# Patient Record
Sex: Female | Born: 1955
Health system: Southern US, Community
[De-identification: ages and names within clinical notes are randomized; demographics above are authoritative.]

## PROBLEM LIST (undated history)

## (undated) DIAGNOSIS — D649 Anemia, unspecified: Secondary | ICD-10-CM

## (undated) DIAGNOSIS — I10 Essential (primary) hypertension: Secondary | ICD-10-CM

## (undated) DIAGNOSIS — Z923 Personal history of irradiation: Secondary | ICD-10-CM

## (undated) DIAGNOSIS — N83209 Unspecified ovarian cyst, unspecified side: Secondary | ICD-10-CM

## (undated) HISTORY — DX: Unspecified ovarian cyst, unspecified side: N83.209

## (undated) HISTORY — DX: Essential (primary) hypertension: I10

## (undated) HISTORY — PX: TOE SURGERY: SHX1073

## (undated) HISTORY — PX: OVARIAN CYST REMOVAL: SHX89

## (undated) HISTORY — PX: COLONOSCOPY: SHX174

## (undated) HISTORY — DX: Anemia, unspecified: D64.9

---

## 1986-08-04 HISTORY — PX: OOPHORECTOMY: SHX86

## 2000-12-17 ENCOUNTER — Encounter: Payer: Self-pay | Admitting: Obstetrics and Gynecology

## 2000-12-17 ENCOUNTER — Encounter: Admission: RE | Admit: 2000-12-17 | Discharge: 2000-12-17 | Payer: Self-pay | Admitting: Obstetrics and Gynecology

## 2001-01-11 ENCOUNTER — Ambulatory Visit (HOSPITAL_COMMUNITY): Admission: RE | Admit: 2001-01-11 | Discharge: 2001-01-11 | Payer: Self-pay | Admitting: Internal Medicine

## 2001-03-10 ENCOUNTER — Encounter: Payer: Self-pay | Admitting: Obstetrics and Gynecology

## 2001-03-11 ENCOUNTER — Ambulatory Visit (HOSPITAL_COMMUNITY): Admission: RE | Admit: 2001-03-11 | Discharge: 2001-03-11 | Payer: Self-pay | Admitting: Obstetrics and Gynecology

## 2001-03-11 ENCOUNTER — Encounter (INDEPENDENT_AMBULATORY_CARE_PROVIDER_SITE_OTHER): Payer: Self-pay | Admitting: Specialist

## 2002-01-21 ENCOUNTER — Encounter: Payer: Self-pay | Admitting: Obstetrics and Gynecology

## 2002-01-21 ENCOUNTER — Ambulatory Visit (HOSPITAL_COMMUNITY): Admission: RE | Admit: 2002-01-21 | Discharge: 2002-01-21 | Payer: Self-pay | Admitting: Obstetrics and Gynecology

## 2003-02-16 ENCOUNTER — Encounter: Payer: Self-pay | Admitting: Obstetrics and Gynecology

## 2003-02-16 ENCOUNTER — Ambulatory Visit (HOSPITAL_COMMUNITY): Admission: RE | Admit: 2003-02-16 | Discharge: 2003-02-16 | Payer: Self-pay | Admitting: Obstetrics and Gynecology

## 2003-10-15 LAB — HM COLONOSCOPY: HM Colonoscopy: NORMAL

## 2004-03-19 ENCOUNTER — Ambulatory Visit (HOSPITAL_COMMUNITY): Admission: RE | Admit: 2004-03-19 | Discharge: 2004-03-19 | Payer: Self-pay | Admitting: Obstetrics and Gynecology

## 2004-12-24 ENCOUNTER — Ambulatory Visit: Payer: Self-pay | Admitting: Internal Medicine

## 2005-03-27 ENCOUNTER — Encounter: Admission: RE | Admit: 2005-03-27 | Discharge: 2005-03-27 | Payer: Self-pay | Admitting: Obstetrics and Gynecology

## 2005-07-04 ENCOUNTER — Ambulatory Visit: Payer: Self-pay | Admitting: Internal Medicine

## 2006-03-19 ENCOUNTER — Ambulatory Visit: Payer: Self-pay | Admitting: Internal Medicine

## 2006-03-31 ENCOUNTER — Encounter: Admission: RE | Admit: 2006-03-31 | Discharge: 2006-03-31 | Payer: Self-pay | Admitting: Obstetrics and Gynecology

## 2006-04-03 ENCOUNTER — Ambulatory Visit: Payer: Self-pay | Admitting: Family Medicine

## 2006-08-28 ENCOUNTER — Ambulatory Visit: Payer: Self-pay | Admitting: Internal Medicine

## 2006-09-11 ENCOUNTER — Ambulatory Visit: Payer: Self-pay | Admitting: Internal Medicine

## 2006-09-11 LAB — HM COLONOSCOPY: HM COLON: NORMAL

## 2007-03-30 ENCOUNTER — Ambulatory Visit: Payer: Self-pay | Admitting: Internal Medicine

## 2007-04-05 LAB — CONVERTED CEMR LAB
BUN: 13 mg/dL (ref 6–23)
Creatinine, Ser: 0.7 mg/dL (ref 0.4–1.2)
Glucose, Bld: 98 mg/dL (ref 70–99)
Potassium: 4.3 meq/L (ref 3.5–5.1)

## 2007-04-06 ENCOUNTER — Encounter (INDEPENDENT_AMBULATORY_CARE_PROVIDER_SITE_OTHER): Payer: Self-pay | Admitting: *Deleted

## 2007-04-12 ENCOUNTER — Ambulatory Visit: Payer: Self-pay | Admitting: Internal Medicine

## 2007-04-12 DIAGNOSIS — E785 Hyperlipidemia, unspecified: Secondary | ICD-10-CM | POA: Insufficient documentation

## 2007-04-12 DIAGNOSIS — I10 Essential (primary) hypertension: Secondary | ICD-10-CM | POA: Insufficient documentation

## 2007-04-12 DIAGNOSIS — G5600 Carpal tunnel syndrome, unspecified upper limb: Secondary | ICD-10-CM | POA: Insufficient documentation

## 2007-04-12 LAB — CONVERTED CEMR LAB
Cholesterol, target level: 200 mg/dL
HDL goal, serum: 40 mg/dL

## 2007-04-28 ENCOUNTER — Encounter: Admission: RE | Admit: 2007-04-28 | Discharge: 2007-04-28 | Payer: Self-pay | Admitting: Obstetrics and Gynecology

## 2007-04-30 ENCOUNTER — Encounter: Payer: Self-pay | Admitting: Internal Medicine

## 2008-05-24 ENCOUNTER — Encounter: Admission: RE | Admit: 2008-05-24 | Discharge: 2008-05-24 | Payer: Self-pay | Admitting: Obstetrics and Gynecology

## 2008-06-12 ENCOUNTER — Telehealth (INDEPENDENT_AMBULATORY_CARE_PROVIDER_SITE_OTHER): Payer: Self-pay | Admitting: *Deleted

## 2008-08-28 ENCOUNTER — Emergency Department (HOSPITAL_COMMUNITY): Admission: EM | Admit: 2008-08-28 | Discharge: 2008-08-29 | Payer: Self-pay | Admitting: Emergency Medicine

## 2008-08-30 ENCOUNTER — Ambulatory Visit: Payer: Self-pay | Admitting: Internal Medicine

## 2008-08-30 DIAGNOSIS — E876 Hypokalemia: Secondary | ICD-10-CM | POA: Insufficient documentation

## 2008-09-04 ENCOUNTER — Telehealth (INDEPENDENT_AMBULATORY_CARE_PROVIDER_SITE_OTHER): Payer: Self-pay | Admitting: *Deleted

## 2008-09-15 ENCOUNTER — Ambulatory Visit: Payer: Self-pay | Admitting: Internal Medicine

## 2009-06-18 ENCOUNTER — Encounter: Admission: RE | Admit: 2009-06-18 | Discharge: 2009-06-18 | Payer: Self-pay | Admitting: Obstetrics and Gynecology

## 2009-06-18 LAB — HM MAMMOGRAPHY: HM Mammogram: NORMAL

## 2009-11-20 ENCOUNTER — Ambulatory Visit: Payer: Self-pay | Admitting: Internal Medicine

## 2010-05-23 ENCOUNTER — Ambulatory Visit: Payer: Self-pay | Admitting: Internal Medicine

## 2010-05-23 DIAGNOSIS — R209 Unspecified disturbances of skin sensation: Secondary | ICD-10-CM | POA: Insufficient documentation

## 2010-05-23 DIAGNOSIS — M2140 Flat foot [pes planus] (acquired), unspecified foot: Secondary | ICD-10-CM | POA: Insufficient documentation

## 2010-05-23 DIAGNOSIS — M79609 Pain in unspecified limb: Secondary | ICD-10-CM | POA: Insufficient documentation

## 2010-06-14 ENCOUNTER — Encounter: Payer: Self-pay | Admitting: Internal Medicine

## 2010-08-25 ENCOUNTER — Encounter: Payer: Self-pay | Admitting: Obstetrics and Gynecology

## 2010-09-01 LAB — CONVERTED CEMR LAB
ALT: 18 units/L (ref 0–35)
AST: 20 units/L (ref 0–37)
Albumin: 4.3 g/dL (ref 3.5–5.2)
Alkaline Phosphatase: 84 units/L (ref 39–117)
Eosinophils Absolute: 0.1 10*3/uL (ref 0.0–0.7)
Hemoglobin: 12.9 g/dL (ref 12.0–15.0)
MCHC: 34.4 g/dL (ref 30.0–36.0)
MCV: 84.3 fL (ref 78.0–100.0)
Monocytes Absolute: 0.4 10*3/uL (ref 0.1–1.0)
Monocytes Relative: 11.2 % (ref 3.0–12.0)
Neutro Abs: 0.5 10*3/uL — ABNORMAL LOW (ref 1.4–7.7)
Neutrophils Relative %: 14 % — ABNORMAL LOW (ref 43.0–77.0)
Potassium: 4 meq/L (ref 3.5–5.1)
RBC: 4.44 M/uL (ref 3.87–5.11)
Total Bilirubin: 0.5 mg/dL (ref 0.3–1.2)
Total Protein: 7.7 g/dL (ref 6.0–8.3)
WBC: 3.5 10*3/uL — ABNORMAL LOW (ref 4.5–10.5)

## 2010-09-05 NOTE — Miscellaneous (Signed)
Summary: Vaccine Record/[facility]  Vaccine Record/[facility]   Imported By: Lanelle Bal 12/05/2009 08:42:55  _____________________________________________________________________  External Attachment:    Type:   Image     Comment:   External Document

## 2010-09-05 NOTE — Assessment & Plan Note (Signed)
Summary: WARM SENSATION AT ANKLE/DENIES PAIN AND SWELLING/KB   Vital Signs:  Patient profile:   55 year old female Weight:      184.2 pounds BMI:     36.10 Temp:     98.3 degrees F oral Pulse rate:   76 / minute Resp:     16 per minute BP sitting:   122 / 80  (left arm) Cuff size:   large  Vitals Entered By: Shonna Chock CMA (May 23, 2010 3:05 PM) CC: 1.) examine right leg and foot (big toe)  2.) Left arm concerns , Lower Extremity Joint pain   CC:  1.) examine right leg and foot (big toe)  2.) Left arm concerns  and Lower Extremity Joint pain.  History of Present Illness:      This is a 55 year old woman who presents with constant  Lower Extremity Joint pain for 3 years.  The patient reports stiffness for >1 hr and decreased ROM, but denies swelling, redness, locking, and popping.  The pain is located in the right great toe joint   The pain is described as sharp, worse with heels.  No evaluation to date . She questions "bunion".It did improve  avoiding extreme heels.  The patient denies the following symptoms: fever, rash, photosensitivity, eye symptoms, diarrhea, and dysuria. Rx: none other than changing foot wear as noted.   She has been having intermittent " warm water" sensation  @ R lateral malleolus X 3 for seconds in past week.   She has pain since 10/16 in L arch w/o injury. PMH of pes planus ; arch supports worn until H.S.   Additionally she has had soreness LUE fro shoulder to elbow; ? related to  caring for her father who has had CVA 4 years ago. This is improving with massage.  He has ? OA; no FH of gout.             Current Medications (verified): 1)  Lisinopril 20 Mg Tabs (Lisinopril) .Marland Kitchen.. 1  Q 12 Hrs 2)  Meclizine Hcl 25 Mg Tabs (Meclizine Hcl) .Marland Kitchen.. 1 By Mouth Every 6 Hours 3)  Verapamil Hcl Cr 120 Mg Cr-Tabs (Verapamil Hcl) .Marland Kitchen.. 1 Two Times A Day 4)  Aspirin 81 Mg Tabs (Aspirin) .Marland Kitchen.. 1 By Mouth Once Daily  Allergies: No Known Drug Allergies  Review of Systems GU:   Denies incontinence. Derm:  Denies lesion(s) and rash. Neuro:  Denies brief paralysis, disturbances in coordination, numbness, poor balance, tingling, and weakness.  Physical Exam  General:  well-nourished,in no acute distress; alert,appropriate and cooperative throughout examination Eyes:  No corneal or conjunctival inflammation noted. Perrla. Mouth:  Oral mucosa and oropharynx without lesions or exudates.  Teeth in good repair. No ulcers Msk:  No deformity or scoliosis noted of thoracic or lumbar spine.   Pulses:  R and L radial,dorsalis pedis and posterior tibial pulses are full and equal bilaterally Extremities:  No clubbing, cyanosis, edema, or deformity noted with normal full range of motion of all joints.   Pes planus. Tender @ base R great toe to palpation. Good nail health Neurologic:  alert & oriented X3, strength normal in all extremities, sensation intact to light touch, and DTRs symmetrical and normal.   Skin:  Intact without suspicious lesions or rashes Cervical Nodes:  No lymphadenopathy noted Axillary Nodes:  No palpable lymphadenopathy Psych:  memory intact for recent and remote, normally interactive, and good eye contact.      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 1  q 12 hrs 2)  Meclizine Hcl 25 Mg Tabs (Meclizine hcl) .Marland Kitchen.. 1 by mouth every 6 hours 3)  Verapamil Hcl Cr 120 Mg Cr-tabs (Verapamil hcl) .Marland Kitchen.. 1 two times a day 4)  Aspirin 81 Mg Tabs (Aspirin) .Marland Kitchen.. 1  by mouth once daily 5)  Tramadol Hcl 50 Mg Tabs (Tramadol hcl) .... 1/2 - 1 every 6 hrs as needed for pain  Patient Instructions: 1)  Arch supports to prevent plantar fasciitis. Podiatry referral if symptoms fail to resolve. Prescriptions: TRAMADOL HCL 50 MG TABS (TRAMADOL HCL) 1/2 - 1 every 6 hrs as needed for pain  #30 x 1   Entered and Authorized by:   Marga Melnick MD   Signed by:   Marga Melnick MD on 05/23/2010   Method used:   Faxed to ...       [facility] (retail)       [address]       [place], [place]  [address]       Ph: [phone]       Fax: [phone]   RxID:   [id number]    Orders Added: 1)  Est. Patient Level IV [54270] 2)  Venipuncture [62376] 3)  TLB-Sedimentation Rate (ESR) [85652-ESR] 4)  TLB-Uric Acid, Blood [84550-URIC] 5)  T-Toe(s) [73660TC]  Appended Document: WARM SENSATION AT ANKLE/DENIES PAIN AND SWELLING/KB

## 2010-09-05 NOTE — Assessment & Plan Note (Signed)
Summary: cpx/ns/kdc   Vital Signs:  Patient profile:   55 year old female Height:      60 inches Weight:      184.0 pounds BMI:     36.06 Temp:     98.7 degrees F oral Pulse rate:   55 / minute Resp:     16 per minute BP sitting:   128 / 86  (left arm) Cuff size:   large  Vitals Entered By: Shonna Chock (November 20, 2009 3:38 PM)  CC: Hypertension Management Comments REVIEWED MED LIST, PATIENT AGREED DOSE AND INSTRUCTION CORRECT    CC:  Hypertension Management.  History of Present Illness: Laura Patrick is here for a physical;she is asymptomatic.   Hypertension History:      She denies headache, chest pain, palpitations, dyspnea with exertion, orthopnea, PND, peripheral edema, visual symptoms, neurologic problems, syncope, and side effects from treatment.  BP 130-147/80-86.        Positive major cardiovascular risk factors include female age 75 years old or older, hyperlipidemia, hypertension, and family history for ischemic heart disease (males less than 45 years old).  Negative major cardiovascular risk factors include no history of diabetes and non-tobacco-user status.        Further assessment for target organ damage reveals no history of ASHD, stroke/TIA, or peripheral vascular disease.     Preventive Screening-Counseling & Management  Alcohol-Tobacco     Smoking Status: never  Caffeine-Diet-Exercise     Does Patient Exercise: no  Allergies (verified): No Known Drug Allergies  Past History:  Past Medical History: Hyperlipidemia: LDL 145 in 1999 Hypertension Shingles 2006  Past Surgical History:  R Oophorectomy 1988 for cyst; G1 P 1 Colonoscopy 2008: negative  Dr Marina Goodell  Family History: Father: HTN, MI @ 64 Mother: DM, CHF Siblings: negative P uncles:DM, CVA; P  aunt DM  Social History: restricts salt Occupation:New Patient  Scheduler  Married Never Smoked Alcohol use-no Regular exercise-no  Review of Systems  The patient denies anorexia, fever,  decreased hearing, hoarseness, syncope, prolonged cough, headaches, hemoptysis, abdominal pain, melena, hematochezia, severe indigestion/heartburn, hematuria, incontinence, suspicious skin lesions, depression, unusual weight change, abnormal bleeding, enlarged lymph nodes, and angioedema.         Weight up 10#.  Eyes:  Denies blurring, double vision, and vision loss-both eyes. CV:  Denies leg cramps with exertion, lightheadness, and near fainting. Neuro:  Denies disturbances in coordination, numbness, poor balance, and tingling. Endo:  Denies cold intolerance, excessive hunger, excessive thirst, excessive urination, and heat intolerance.  Physical Exam  General:  well-nourished; alert,appropriate and cooperative throughout examination Head:  Normocephalic and atraumatic without obvious abnormalities.  Eyes:  No corneal or conjunctival inflammation noted.  Perrla. Funduscopic exam benign, without hemorrhages, exudates or papilledema.  Ears:  External ear exam shows no significant lesions or deformities.  Otoscopic examination reveals clear canals, tympanic membranes are intact bilaterally without bulging, retraction, inflammation or discharge. Hearing is grossly normal bilaterally. Nose:  External nasal examination shows no deformity or inflammation. Nasal mucosa are pink and moist without lesions or exudates. Mouth:  Oral mucosa and oropharynx without lesions or exudates.  Teeth in good repair. Neck:  No deformities, masses, or tenderness noted. Lungs:  Normal respiratory effort, chest expands symmetrically. Lungs are clear to auscultation, no crackles or wheezes. Heart:  normal rate, regular rhythm, no gallop, no rub, no JVD, no HJR, and grade 1 /6 systolic murmur.   Abdomen:  Bowel sounds positive,abdomen soft and non-tender without masses, organomegaly or  hernias noted. Genitalia:  Dr Elana Alm Msk:  No deformity or scoliosis noted of thoracic or lumbar spine.   Pulses:  R and L  carotid,radial,dorsalis pedis and posterior tibial pulses are full and equal bilaterally Extremities:  No clubbing, cyanosis, edema, or deformity noted with normal full range of motion of all joints.   Minor crepitus L knee.Pes planus Neurologic:  alert & oriented X3 and DTRs symmetrical and 1/2+. Negative Tinel's @ wrist Skin:  Intact without suspicious lesions or rashes Cervical Nodes:  No lymphadenopathy noted Axillary Nodes:  No palpable lymphadenopathy Psych:  memory intact for recent and remote, normally interactive, and good eye contact.     Impression & Recommendations:  Problem # 1:  ROUTINE GENERAL MEDICAL EXAM@HEALTH  CARE FACL (ICD-V70.0)  Orders: Venipuncture (16109) TLB-BMP (Basic Metabolic Panel-BMET) (80048-METABOL) TLB-CBC Platelet - w/Differential (85025-CBCD) TLB-Hepatic/Liver Function Pnl (80076-HEPATIC) TLB-TSH (Thyroid Stimulating Hormone) (84443-TSH) T-NMR, Lipoprofile (60454-09811) EKG w/ Interpretation (93000)  Problem # 2:  HYPERTENSION, ESSENTIAL NOS (ICD-401.9)  Her updated medication list for this problem includes:    Lisinopril 20 Mg Tabs (Lisinopril) .Marland Kitchen... 1  q 12 hrs    Verapamil Hcl Cr 120 Mg Cr-tabs (Verapamil hcl) .Marland Kitchen... 1 two times a day  Orders: Venipuncture (91478) EKG w/ Interpretation (93000)  Problem # 3:  HYPERLIPIDEMIA NEC/NOS (ICD-272.4)  Orders: Venipuncture (29562)  Problem # 4:  ISCHEMIC HEART DISEASE, PREMATURE, FAMILY HX (ICD-V17.3) Father MI @ 33 Orders: Venipuncture (13086) T-NMR, Lipoprofile (57846-96295)  Complete Medication List: 1)  Lisinopril 20 Mg Tabs (Lisinopril) .Marland Kitchen.. 1  q 12 hrs 2)  Meclizine Hcl 25 Mg Tabs (Meclizine hcl) .Marland Kitchen.. 1 by mouth every 6 hours 3)  Verapamil Hcl Cr 120 Mg Cr-tabs (Verapamil hcl) .Marland Kitchen.. 1 two times a day  Hypertension Assessment/Plan:      The patient's hypertensive risk group is category B: At least one risk factor (excluding diabetes) with no target organ damage.  Today's blood pressure  is 128/86.    Patient Instructions: 1)  Check your Blood Pressure regularly. If it is above:135/85 ON AVERAGE  you should make an appointment. Prescriptions: VERAPAMIL HCL CR 120 MG CR-TABS (VERAPAMIL HCL) 1 two times a day  #180 Each x 3   Entered and Authorized by:   Marga Melnick MD   Signed by:   Marga Melnick MD on 11/20/2009   Method used:   Faxed to ...       [facility] ([facility])       [address]       [place], [place]  [address]       Ph: [phone]       Fax: [phone]   RxID:   [id number] LISINOPRIL 20 MG TABS (LISINOPRIL) 1  q 12 hrs  #180 Each x 3   Entered and Authorized by:   Marga Melnick MD   Signed by:   Marga Melnick MD on 11/20/2009   Method used:   Faxed to ...       [facility])       [address]       [place], [place]  [address]       Ph: [phone]       Fax: [phone]   RxID:   [id number]

## 2010-10-15 LAB — HM PAP SMEAR: HM PAP: NORMAL

## 2010-11-18 LAB — CBC
Hemoglobin: 13.2 g/dL (ref 12.0–15.0)
MCV: 83.9 fL (ref 78.0–100.0)
Platelets: 237 10*3/uL (ref 150–400)
WBC: 5.3 10*3/uL (ref 4.0–10.5)

## 2010-11-18 LAB — DIFFERENTIAL
Basophils Absolute: 0.1 10*3/uL (ref 0.0–0.1)
Eosinophils Relative: 1 % (ref 0–5)
Lymphocytes Relative: 26 % (ref 12–46)
Monocytes Relative: 7 % (ref 3–12)
Neutrophils Relative %: 64 % (ref 43–77)

## 2010-11-18 LAB — BASIC METABOLIC PANEL
BUN: 9 mg/dL (ref 6–23)
CO2: 27 mEq/L (ref 19–32)
Chloride: 102 mEq/L (ref 96–112)
GFR calc non Af Amer: >60 mL/min (ref >60)
Glucose, Bld: 121 mg/dL — ABNORMAL HIGH (ref 70–99)

## 2010-11-18 LAB — POCT CARDIAC MARKERS: Myoglobin, poc: 66.3 ng/mL (ref 12–200)

## 2010-11-18 LAB — BRAIN NATRIURETIC PEPTIDE: Pro B Natriuretic peptide (BNP): 73 pg/mL (ref 0.0–100.0)

## 2010-12-20 NOTE — Op Note (Signed)
[facility]  Patient:    Laura Patrick, Laura Patrick                    MRN: [id number] Proc. Date: 03/11/01 Adm. Date:  [id number] Attending:  Lendon Colonel                           Operative Report  PREOPERATIVE DIAGNOSIS:  Fibroids with anemia.  POSTOPERATIVE DIAGNOSES:  Fibroids with anemia.  OPERATION:  Hysteroscopy with resection of endometrial fibroid.  DESCRIPTION OF PROCEDURE:  The patient was placed in lithotomy position and prepped and draped in the usual fashion.  Cervix dilated, and large fibroids were noted anteriorly and posteriorly.  Using the resectoscope, I was able to resect all of these fibroids.  No unusual blood loss occurred during the procedure.  All of the resecting material was sent to the lab for study. [name] tolerated this procedure well. DD:  03/11/01 TD:  03/12/01 Job: [id number]

## 2011-01-17 ENCOUNTER — Other Ambulatory Visit: Payer: Self-pay | Admitting: Internal Medicine

## 2011-11-26 ENCOUNTER — Other Ambulatory Visit: Payer: Self-pay | Admitting: Internal Medicine

## 2011-11-26 DIAGNOSIS — Z1231 Encounter for screening mammogram for malignant neoplasm of breast: Secondary | ICD-10-CM

## 2011-12-01 ENCOUNTER — Ambulatory Visit: Payer: Self-pay

## 2012-04-23 ENCOUNTER — Ambulatory Visit
Admission: RE | Admit: 2012-04-23 | Discharge: 2012-04-23 | Disposition: A | Discharge status: Home / Self Care | Payer: Commercial Managed Care - PPO | Source: Ambulatory Visit | Attending: Internal Medicine | Admitting: Internal Medicine

## 2012-04-23 DIAGNOSIS — Z1231 Encounter for screening mammogram for malignant neoplasm of breast: Secondary | ICD-10-CM

## 2013-08-09 ENCOUNTER — Other Ambulatory Visit: Payer: Self-pay

## 2013-08-09 DIAGNOSIS — Z1231 Encounter for screening mammogram for malignant neoplasm of breast: Secondary | ICD-10-CM

## 2013-08-29 ENCOUNTER — Ambulatory Visit: Payer: Commercial Managed Care - PPO

## 2013-09-05 ENCOUNTER — Ambulatory Visit: Payer: Commercial Managed Care - PPO

## 2013-09-09 ENCOUNTER — Ambulatory Visit: Payer: Commercial Managed Care - PPO

## 2013-10-13 ENCOUNTER — Telehealth: Payer: Self-pay

## 2013-10-13 NOTE — Telephone Encounter (Signed)
Left message for call back Non-identifiable   Pap [facility]- 04/23/12-negative Flu Td

## 2013-10-13 NOTE — Telephone Encounter (Signed)
Medication and allergies:  Reviewed and updated  90 day supply/mail order: n/a Local pharmacy:  [facility] [place], [place] - [address]   Immunizations due:     A/P: No changes to personal, family history or past surgical hx PAP- "more than three years ago" per patient [facility]- 08/2004- negative per patient [facility]- 04/23/12-negative Flu Tdap   To Discuss with Provider:

## 2013-10-14 ENCOUNTER — Encounter: Payer: Self-pay | Admitting: Family Medicine

## 2013-10-14 ENCOUNTER — Ambulatory Visit (INDEPENDENT_AMBULATORY_CARE_PROVIDER_SITE_OTHER): Payer: Commercial Managed Care - PPO | Admitting: Family Medicine

## 2013-10-14 VITALS — BP 130/78 | HR 84 | Temp 98.5°F | Resp 16 | Ht 61.0 in | Wt 188.5 lb

## 2013-10-14 DIAGNOSIS — E785 Hyperlipidemia, unspecified: Secondary | ICD-10-CM

## 2013-10-14 DIAGNOSIS — E669 Obesity, unspecified: Secondary | ICD-10-CM

## 2013-10-14 DIAGNOSIS — I10 Essential (primary) hypertension: Secondary | ICD-10-CM

## 2013-10-14 LAB — CBC WITH DIFFERENTIAL/PLATELET
Basophils Absolute: 0 10*3/uL (ref 0.0–0.1)
Basophils Relative: 1 % (ref 0–1)
EOS ABS: 0 10*3/uL (ref 0.0–0.7)
EOS PCT: 1 % (ref 0–5)
HCT: 37.1 % (ref 36.0–46.0)
Hemoglobin: 12.4 g/dL (ref 12.0–15.0)
LYMPHS ABS: 2.2 10*3/uL (ref 0.7–4.0)
Lymphocytes Relative: 51 % — ABNORMAL HIGH (ref 12–46)
MCH: 27.2 pg (ref 26.0–34.0)
MCHC: 33.4 g/dL (ref 30.0–36.0)
MCV: 81.4 fL (ref 78.0–100.0)
Monocytes Absolute: 0.3 10*3/uL (ref 0.1–1.0)
Monocytes Relative: 7 % (ref 3–12)
Neutro Abs: 1.7 10*3/uL (ref 1.7–7.7)
Neutrophils Relative %: 40 % — ABNORMAL LOW (ref 43–77)
PLATELETS: 313 10*3/uL (ref 150–400)
RBC: 4.56 MIL/uL (ref 3.87–5.11)
RDW: 14.5 % (ref 11.5–15.5)
WBC: 4.3 10*3/uL (ref 4.0–10.5)

## 2013-10-14 NOTE — Progress Notes (Signed)
   Subjective:    Patient ID: Laura Patrick, female    DOB: 11-21-1955, 58 y.o.   MRN: [id number]  HPI New to establish.  Previous MD- Linna Darner but has not seen in >3 yrs.  HTN- was previously on Verapamil but after listening to advice has started to eat better and get regular activity, BP normalized.  Has not been on meds x2 yrs.  BP adequately controlled today.  No CP, SOB, HAs, visual changes, edema.  Hyperlipidemia- noted on problem list.  Has never been on meds.  Has not had labs checked recently.  Eating healthy diet regularly and attempting regular exercise.  Obesity- chronic problem, pt is attempting to control w/ healthy diet and regular exercise.  Has a FitBit to track steps.  Reports she has gained 10+ since last check.   Review of Systems For ROS see HPI     Objective:   Physical Exam  Vitals reviewed. Constitutional: She is oriented to person, place, and time. She appears well-developed and well-nourished. No distress.  HENT:  Head: Normocephalic and atraumatic.  Eyes: Conjunctivae and EOM are normal. Pupils are equal, round, and reactive to light.  Neck: Normal range of motion. Neck supple. No thyromegaly present.  Cardiovascular: Normal rate, regular rhythm, normal heart sounds and intact distal pulses.   No murmur heard. Pulmonary/Chest: Effort normal and breath sounds normal. No respiratory distress.  Abdominal: Soft. She exhibits no distension. There is no tenderness.  Musculoskeletal: She exhibits no edema.  Lymphadenopathy:    She has no cervical adenopathy.  Neurological: She is alert and oriented to person, place, and time.  Skin: Skin is warm and dry.  Psychiatric: She has a normal mood and affect. Her behavior is normal.          Assessment & Plan:

## 2013-10-14 NOTE — Patient Instructions (Signed)
Schedule your complete physical in 6 months We'll notify you of your lab results and make any changes if needed Keep up the good work on healthy diet and regular exercise!! Call with any questions or concerns Welcome Back!!!

## 2013-10-14 NOTE — Progress Notes (Signed)
Pre visit review using our clinic review tool, if applicable. No additional management support is needed unless otherwise documented below in the visit note. 

## 2013-10-15 LAB — BASIC METABOLIC PANEL
BUN: 10 mg/dL (ref 6–23)
CALCIUM: 9.4 mg/dL (ref 8.4–10.5)
CHLORIDE: 107 meq/L (ref 96–112)
CO2: 25 mEq/L (ref 19–32)
CREATININE: 0.82 mg/dL (ref 0.50–1.10)
GLUCOSE: 81 mg/dL (ref 70–99)
POTASSIUM: 3.7 meq/L (ref 3.5–5.3)
Sodium: 141 mEq/L (ref 135–145)

## 2013-10-15 LAB — LIPID PANEL
Cholesterol: 172 mg/dL (ref 0–200)
HDL: 57 mg/dL (ref >39)
LDL Cholesterol: 98 mg/dL (ref 0–99)
TRIGLYCERIDES: 83 mg/dL (ref <150)
Total CHOL/HDL Ratio: 3 Ratio
VLDL: 17 mg/dL (ref 0–40)

## 2013-10-15 LAB — HEPATIC FUNCTION PANEL
ALBUMIN: 4.5 g/dL (ref 3.5–5.2)
ALT: 15 U/L (ref 0–35)
AST: 19 U/L (ref 0–37)
Alkaline Phosphatase: 78 U/L (ref 39–117)
BILIRUBIN DIRECT: 0.1 mg/dL (ref 0.0–0.3)
BILIRUBIN TOTAL: 0.4 mg/dL (ref 0.2–1.2)
Indirect Bilirubin: 0.3 mg/dL (ref 0.2–1.2)
TOTAL PROTEIN: 7.9 g/dL (ref 6.0–8.3)

## 2013-10-15 LAB — TSH: TSH: 1.332 u[IU]/mL (ref 0.350–4.500)

## 2013-10-15 NOTE — Assessment & Plan Note (Signed)
Noted on problem list from previous provider.  Has never been on meds.  Check labs.  Start meds prn.

## 2013-10-15 NOTE — Assessment & Plan Note (Signed)
New.  Check labs to risk stratify.  Pt is working on regular exercise and healthy food choices.  Will continue to follow closely.

## 2013-10-15 NOTE — Assessment & Plan Note (Signed)
Noted previously by pt's former physician but has made lifestyle changes and has not required meds recently.  Asymptomatic.  No need for meds at this time.  Will follow.

## 2013-10-17 ENCOUNTER — Encounter: Payer: Self-pay | Admitting: General Practice

## 2013-10-18 ENCOUNTER — Encounter: Payer: Self-pay | Admitting: General Practice

## 2014-04-18 ENCOUNTER — Encounter: Payer: Commercial Managed Care - PPO | Admitting: Family Medicine

## 2014-04-21 ENCOUNTER — Encounter: Payer: Commercial Managed Care - PPO | Admitting: Family Medicine

## 2014-08-08 ENCOUNTER — Ambulatory Visit
Admission: RE | Admit: 2014-08-08 | Discharge: 2014-08-08 | Disposition: A | Discharge status: Home / Self Care | Payer: Commercial Managed Care - PPO | Source: Ambulatory Visit

## 2014-08-08 DIAGNOSIS — Z1231 Encounter for screening mammogram for malignant neoplasm of breast: Secondary | ICD-10-CM

## 2014-08-10 ENCOUNTER — Ambulatory Visit (INDEPENDENT_AMBULATORY_CARE_PROVIDER_SITE_OTHER): Payer: Commercial Managed Care - PPO | Admitting: Family Medicine

## 2014-08-10 ENCOUNTER — Other Ambulatory Visit (HOSPITAL_COMMUNITY)
Admission: RE | Admit: 2014-08-10 | Discharge: 2014-08-10 | Disposition: A | Discharge status: Home / Self Care | Payer: Commercial Managed Care - PPO | Source: Ambulatory Visit | Attending: Family Medicine | Admitting: Family Medicine

## 2014-08-10 ENCOUNTER — Encounter: Payer: Self-pay | Admitting: Family Medicine

## 2014-08-10 VITALS — BP 162/94 | HR 85 | Temp 98.1°F | Resp 16 | Ht 61.0 in | Wt 192.0 lb

## 2014-08-10 DIAGNOSIS — Z1151 Encounter for screening for human papillomavirus (HPV): Secondary | ICD-10-CM | POA: Insufficient documentation

## 2014-08-10 DIAGNOSIS — I10 Essential (primary) hypertension: Secondary | ICD-10-CM

## 2014-08-10 DIAGNOSIS — Z124 Encounter for screening for malignant neoplasm of cervix: Secondary | ICD-10-CM

## 2014-08-10 DIAGNOSIS — Z01419 Encounter for gynecological examination (general) (routine) without abnormal findings: Secondary | ICD-10-CM

## 2014-08-10 DIAGNOSIS — Z0000 Encounter for general adult medical examination without abnormal findings: Secondary | ICD-10-CM | POA: Insufficient documentation

## 2014-08-10 MED ORDER — LOSARTAN POTASSIUM 50 MG PO TABS: 50.0000 mg | ORAL_TABLET | Freq: Every day | ORAL | Status: DC

## 2014-08-10 NOTE — Progress Notes (Signed)
Pre visit review using our clinic review tool, if applicable. No additional management support is needed unless otherwise documented below in the visit note. 

## 2014-08-10 NOTE — Patient Instructions (Signed)
Follow up in 1 month to recheck BP We'll notify you of your lab results and make any changes if needed Try and make healthy food choices and get regular exercise Start the Losartan daily for BP Limit your salt intake Call with any questions or concerns Happy New Year!

## 2014-08-10 NOTE — Assessment & Plan Note (Signed)
Pap collected. 

## 2014-08-10 NOTE — Progress Notes (Signed)
   Subjective:    Patient ID: Laura Patrick, female    DOB: 1955-08-09, 59 y.o.   MRN: [id number]  HPI CPE- pt UTD on colonoscopy and mammo.  Due for pap.  Pt has hx of HTN but not currently on meds.  BP is much higher than previous.   Review of Systems Patient reports no vision/ hearing changes, adenopathy,fever, weight change,  persistant/recurrent hoarseness , swallowing issues, chest pain, palpitations, edema, persistant/recurrent cough, hemoptysis, dyspnea (rest/exertional/paroxysmal nocturnal), gastrointestinal bleeding (melena, rectal bleeding), abdominal pain, significant heartburn, bowel changes, GU symptoms (dysuria, hematuria, incontinence), Gyn symptoms (abnormal  bleeding, pain),  syncope, focal weakness, memory loss, numbness & tingling, skin/hair/nail changes, abnormal bruising or bleeding, anxiety, or depression.     Objective:   Physical Exam  General Appearance:    Alert, cooperative, no distress, appears stated age  Head:    Normocephalic, without obvious abnormality, atraumatic  Eyes:    PERRL, conjunctiva/corneas clear, EOM's intact, fundi    benign, both eyes  Ears:    Normal TM's and external ear canals, both ears  Nose:   Nares normal, septum midline, mucosa normal, no drainage    or sinus tenderness  Throat:   Lips, mucosa, and tongue normal; teeth and gums normal  Neck:   Supple, symmetrical, trachea midline, no adenopathy;    Thyroid: no enlargement/tenderness/nodules  Back:     Symmetric, no curvature, ROM normal, no CVA tenderness  Lungs:     Clear to auscultation bilaterally, respirations unlabored  Chest Wall:    No tenderness or deformity   Heart:    Regular rate and rhythm, S1 and S2 normal, no murmur, rub   or gallop  Breast Exam:    No tenderness, masses, or nipple abnormality  Abdomen:     Soft, non-tender, bowel sounds active all four quadrants,    no masses, no organomegaly  Genitalia:    External genitalia normal, cervix normal in  appearance, no CMT, uterus in normal size and position, adnexa w/out mass or tenderness, mucosa pink and moist, no lesions or discharge present  Rectal:    Normal external appearance  Extremities:   Extremities normal, atraumatic, no cyanosis or edema  Pulses:   2+ and symmetric all extremities  Skin:   Skin color, texture, turgor normal, no rashes or lesions  Lymph nodes:   Cervical, supraclavicular, and axillary nodes normal  Neurologic:   CNII-XII intact, normal strength, sensation and reflexes    throughout          Assessment & Plan:

## 2014-08-10 NOTE — Assessment & Plan Note (Signed)
Deteriorated since pt has slacked on healthy diet and regular exercise.  Start low dose Losartan.  Check labs.  Reviewed importance of healthy diet and regular exercise as well as limiting sodium.  Reviewed supportive care and red flags that should prompt return.  Pt expressed understanding and is in agreement w/ plan.

## 2014-08-10 NOTE — Assessment & Plan Note (Signed)
Pt's PE WNL w/ exception of obesity.  UTD on colonoscopy, mammo.  Pap collected today.  Check labs.  Anticipatory guidance provided.

## 2014-08-11 ENCOUNTER — Other Ambulatory Visit: Payer: Self-pay | Admitting: General Practice

## 2014-08-11 LAB — BASIC METABOLIC PANEL
BUN: 12 mg/dL (ref 6–23)
CALCIUM: 9.3 mg/dL (ref 8.4–10.5)
CHLORIDE: 106 meq/L (ref 96–112)
CO2: 25 mEq/L (ref 19–32)
CREATININE: 0.7 mg/dL (ref 0.4–1.2)
GFR: 102.7 mL/min (ref >60.00)
Glucose, Bld: 78 mg/dL (ref 70–99)
Potassium: 3.7 mEq/L (ref 3.5–5.1)
Sodium: 139 mEq/L (ref 135–145)

## 2014-08-11 LAB — TSH: TSH: 1.24 u[IU]/mL (ref 0.35–4.50)

## 2014-08-11 LAB — CBC WITH DIFFERENTIAL/PLATELET
BASOS PCT: 0.9 % (ref 0.0–3.0)
Basophils Absolute: 0 10*3/uL (ref 0.0–0.1)
EOS ABS: 0 10*3/uL (ref 0.0–0.7)
Eosinophils Relative: 1.1 % (ref 0.0–5.0)
HEMATOCRIT: 43 % (ref 36.0–46.0)
HEMOGLOBIN: 14.1 g/dL (ref 12.0–15.0)
LYMPHS ABS: 1.9 10*3/uL (ref 0.7–4.0)
Lymphocytes Relative: 42.7 % (ref 12.0–46.0)
MCHC: 32.7 g/dL (ref 30.0–36.0)
MCV: 84.8 fl (ref 78.0–100.0)
MONO ABS: 0.4 10*3/uL (ref 0.1–1.0)
Monocytes Relative: 8.2 % (ref 3.0–12.0)
Neutro Abs: 2.1 10*3/uL (ref 1.4–7.7)
Neutrophils Relative %: 47.1 % (ref 43.0–77.0)
Platelets: 275 10*3/uL (ref 150.0–400.0)
RBC: 5.08 Mil/uL (ref 3.87–5.11)
RDW: 14.8 % (ref 11.5–15.5)
WBC: 4.4 10*3/uL (ref 4.0–10.5)

## 2014-08-11 LAB — LIPID PANEL
CHOL/HDL RATIO: 4
CHOLESTEROL: 168 mg/dL (ref 0–200)
HDL: 46.5 mg/dL (ref >39.00)
LDL CALC: 99 mg/dL (ref 0–99)
NonHDL: 121.5
TRIGLYCERIDES: 114 mg/dL (ref 0.0–149.0)
VLDL: 22.8 mg/dL (ref 0.0–40.0)

## 2014-08-11 LAB — HEPATIC FUNCTION PANEL
ALK PHOS: 78 U/L (ref 39–117)
ALT: 16 U/L (ref 0–35)
AST: 20 U/L (ref 0–37)
Albumin: 4.6 g/dL (ref 3.5–5.2)
BILIRUBIN TOTAL: 0.5 mg/dL (ref 0.2–1.2)
Bilirubin, Direct: 0.1 mg/dL (ref 0.0–0.3)
TOTAL PROTEIN: 8.5 g/dL — AB (ref 6.0–8.3)

## 2014-08-11 LAB — VITAMIN D 25 HYDROXY (VIT D DEFICIENCY, FRACTURES): VITD: 12.93 ng/mL — ABNORMAL LOW (ref 30.00–100.00)

## 2014-08-11 MED ORDER — VITAMIN D (ERGOCALCIFEROL) 1.25 MG (50000 UNIT) PO CAPS: 50000.0000 [IU] | ORAL_CAPSULE | ORAL | Status: DC

## 2014-08-14 LAB — CYTOLOGY - PAP

## 2014-09-11 ENCOUNTER — Encounter: Payer: Self-pay | Admitting: Family Medicine

## 2014-09-11 ENCOUNTER — Ambulatory Visit (INDEPENDENT_AMBULATORY_CARE_PROVIDER_SITE_OTHER): Payer: Commercial Managed Care - PPO | Admitting: Family Medicine

## 2014-09-11 VITALS — BP 150/90 | HR 73 | Temp 98.4°F | Resp 16 | Wt 190.5 lb

## 2014-09-11 DIAGNOSIS — I10 Essential (primary) hypertension: Secondary | ICD-10-CM

## 2014-09-11 MED ORDER — LOSARTAN POTASSIUM-HCTZ 100-12.5 MG PO TABS: 1.0000 | ORAL_TABLET | Freq: Every day | ORAL | Status: DC

## 2014-09-11 NOTE — Progress Notes (Signed)
   Subjective:    Patient ID: Laura Patrick, female    DOB: [birth year], 59 y.o.   MRN: [id number]  HPI HTN- chronic problem, pt started Losartan at last visit.  BP is better than previous but still not at goal.  Pt admits she is still eating a high Na diet.  Pt is taking med regularly.  No CP, SOB, HAs, visual changes, edema.   Review of Systems For ROS see HPI     Objective:   Physical Exam  Constitutional: She is oriented to person, place, and time. She appears well-developed and well-nourished. No distress.  HENT:  Head: Normocephalic and atraumatic.  Eyes: Conjunctivae and EOM are normal. Pupils are equal, round, and reactive to light.  Neck: Normal range of motion. Neck supple. No thyromegaly present.  Cardiovascular: Normal rate, regular rhythm, normal heart sounds and intact distal pulses.   No murmur heard. Pulmonary/Chest: Effort normal and breath sounds normal. No respiratory distress.  Abdominal: Soft. She exhibits no distension. There is no tenderness.  Musculoskeletal: She exhibits no edema.  Lymphadenopathy:    She has no cervical adenopathy.  Neurological: She is alert and oriented to person, place, and time.  Skin: Skin is warm and dry.  Psychiatric: She has a normal mood and affect. Her behavior is normal.  Vitals reviewed.         Assessment & Plan:

## 2014-09-11 NOTE — Progress Notes (Signed)
Pre visit review using our clinic review tool, if applicable. No additional management support is needed unless otherwise documented below in the visit note. 

## 2014-09-11 NOTE — Patient Instructions (Signed)
Follow up in 4-6 weeks to recheck BP We'll notify you of your lab results and make any changes Start the Losartan HCTZ daily for BP Try and increase your water intake and limit your salt Try get regular exercise Call with any questions or concerns Happy Maharaj's Day!

## 2014-09-11 NOTE — Assessment & Plan Note (Signed)
Chronic problem.  Pt's BP has improved but is not at goal.  Based on this and her weakness for salt, will increase Losartan to 100mg  and add HCTZ component.  Stressed need for low Na diet, increased water intake, regular exercise.  Check BMP due to addition of ARB.  Will follow closely.

## 2014-09-12 LAB — BASIC METABOLIC PANEL
BUN: 11 mg/dL (ref 6–23)
CALCIUM: 9.6 mg/dL (ref 8.4–10.5)
CO2: 28 mEq/L (ref 19–32)
Chloride: 107 mEq/L (ref 96–112)
Creatinine, Ser: 0.84 mg/dL (ref 0.40–1.20)
GFR: 88.7 mL/min (ref >60.00)
GLUCOSE: 74 mg/dL (ref 70–99)
POTASSIUM: 3.9 meq/L (ref 3.5–5.1)
SODIUM: 139 meq/L (ref 135–145)

## 2014-10-16 ENCOUNTER — Ambulatory Visit (INDEPENDENT_AMBULATORY_CARE_PROVIDER_SITE_OTHER): Payer: Commercial Managed Care - PPO | Admitting: Family Medicine

## 2014-10-16 ENCOUNTER — Encounter: Payer: Self-pay | Admitting: Family Medicine

## 2014-10-16 VITALS — BP 134/84 | HR 76 | Temp 98.4°F | Resp 16 | Wt 188.5 lb

## 2014-10-16 DIAGNOSIS — I10 Essential (primary) hypertension: Secondary | ICD-10-CM

## 2014-10-16 MED ORDER — LOSARTAN POTASSIUM-HCTZ 100-12.5 MG PO TABS: 1.0000 | ORAL_TABLET | Freq: Every day | ORAL | Status: DC

## 2014-10-16 MED ORDER — VITAMIN D (ERGOCALCIFEROL) 1.25 MG (50000 UNIT) PO CAPS: 50000.0000 [IU] | ORAL_CAPSULE | ORAL | Status: DC

## 2014-10-16 NOTE — Progress Notes (Signed)
Pre visit review using our clinic review tool, if applicable. No additional management support is needed unless otherwise documented below in the visit note. 

## 2014-10-16 NOTE — Assessment & Plan Note (Signed)
Pt's BP is much improved since increasing Losartan and adding HCTZ.  Check BMP.  No anticipated changes.  Will follow.

## 2014-10-16 NOTE — Patient Instructions (Signed)
Follow up in 6 months to recheck BP Continue the current medication- you look great! We'll notify you of your lab results and make any changes if needed Call with any questions or concerns Happy Early Rudene Anda!!!

## 2014-10-16 NOTE — Progress Notes (Signed)
   Subjective:    Patient ID: Laura Patrick, female    DOB: 12-25-1955, 59 y.o.   MRN: [id number]  HPI HTN- new problem for pt.  BP is MUCH improved since increasing Losartan and adding HCTZ.  No CP, SOB, HAs, visual changes, edema.  No increased urinary frequency.  'i feel better'.   Review of Systems For ROS see HPI     Objective:   Physical Exam  Constitutional: She is oriented to person, place, and time. She appears well-developed and well-nourished. No distress.  HENT:  Head: Normocephalic and atraumatic.  Eyes: Conjunctivae and EOM are normal. Pupils are equal, round, and reactive to light.  Neck: Normal range of motion. Neck supple. No thyromegaly present.  Cardiovascular: Normal rate, regular rhythm, normal heart sounds and intact distal pulses.   No murmur heard. Pulmonary/Chest: Effort normal and breath sounds normal. No respiratory distress.  Abdominal: Soft. She exhibits no distension. There is no tenderness.  Musculoskeletal: She exhibits no edema.  Lymphadenopathy:    She has no cervical adenopathy.  Neurological: She is alert and oriented to person, place, and time.  Skin: Skin is warm and dry.  Psychiatric: She has a normal mood and affect. Her behavior is normal.  Vitals reviewed.         Assessment & Plan:

## 2014-10-17 LAB — BASIC METABOLIC PANEL
BUN: 13 mg/dL (ref 6–23)
CHLORIDE: 106 meq/L (ref 96–112)
CO2: 26 mEq/L (ref 19–32)
Calcium: 9.7 mg/dL (ref 8.4–10.5)
Creatinine, Ser: 0.87 mg/dL (ref 0.40–1.20)
GFR: 85.15 mL/min (ref >60.00)
Glucose, Bld: 79 mg/dL (ref 70–99)
Potassium: 3.6 mEq/L (ref 3.5–5.1)
SODIUM: 138 meq/L (ref 135–145)

## 2015-04-19 ENCOUNTER — Ambulatory Visit: Payer: Commercial Managed Care - PPO | Admitting: Family Medicine

## 2015-04-27 ENCOUNTER — Ambulatory Visit (INDEPENDENT_AMBULATORY_CARE_PROVIDER_SITE_OTHER): Payer: Commercial Managed Care - PPO | Admitting: Family Medicine

## 2015-04-27 ENCOUNTER — Encounter: Payer: Self-pay | Admitting: Family Medicine

## 2015-04-27 VITALS — BP 134/84 | HR 71 | Temp 98.0°F | Resp 16 | Ht 61.0 in | Wt 190.5 lb

## 2015-04-27 DIAGNOSIS — Z23 Encounter for immunization: Secondary | ICD-10-CM

## 2015-04-27 DIAGNOSIS — I10 Essential (primary) hypertension: Secondary | ICD-10-CM

## 2015-04-27 LAB — BASIC METABOLIC PANEL
BUN: 14 mg/dL (ref 7–25)
CHLORIDE: 104 mmol/L (ref 98–110)
CO2: 30 mmol/L (ref 20–31)
Calcium: 9.7 mg/dL (ref 8.6–10.4)
Creat: 0.93 mg/dL (ref 0.50–0.99)
GLUCOSE: 80 mg/dL (ref 65–99)
Potassium: 4.4 mmol/L (ref 3.5–5.3)
SODIUM: 138 mmol/L (ref 135–146)

## 2015-04-27 LAB — CBC WITH DIFFERENTIAL/PLATELET
Basophils Absolute: 0 10*3/uL (ref 0.0–0.1)
Basophils Relative: 0 % (ref 0–1)
EOS PCT: 2 % (ref 0–5)
Eosinophils Absolute: 0.1 10*3/uL (ref 0.0–0.7)
HEMATOCRIT: 40.8 % (ref 36.0–46.0)
Hemoglobin: 13.4 g/dL (ref 12.0–15.0)
LYMPHS ABS: 2.1 10*3/uL (ref 0.7–4.0)
Lymphocytes Relative: 42 % (ref 12–46)
MCH: 27.5 pg (ref 26.0–34.0)
MCHC: 32.8 g/dL (ref 30.0–36.0)
MCV: 83.6 fL (ref 78.0–100.0)
MONO ABS: 0.5 10*3/uL (ref 0.1–1.0)
MPV: 9.6 fL (ref 8.6–12.4)
Monocytes Relative: 11 % (ref 3–12)
Neutro Abs: 2.2 10*3/uL (ref 1.7–7.7)
Neutrophils Relative %: 45 % (ref 43–77)
Platelets: 300 10*3/uL (ref 150–400)
RBC: 4.88 MIL/uL (ref 3.87–5.11)
RDW: 14.5 % (ref 11.5–15.5)
WBC: 4.9 10*3/uL (ref 4.0–10.5)

## 2015-04-27 NOTE — Patient Instructions (Signed)
Schedule your complete physical in 6 months We'll notify you of your lab results and make any changes if needed Keep up the good work on healthy diet and regular exercise- you look great! Call with any questions or concerns If you want to join us at the new [place] office, any scheduled appointments will automatically transfer and we will see you at [address], [place], [place] [address]  Happy Fall!!! 

## 2015-04-27 NOTE — Progress Notes (Signed)
   Subjective:    Patient ID: Laura Patrick, female    DOB: 1955/10/27, 59 y.o.   MRN: [id number]  HPI HTN- chronic problem.  On Losartan HCTZ.  Pt reports that today's BP is slightly elevated compared to what it used to be.  Admits to increased soft drink intake recently.  Exercising regularly- enjoys walking.  No CP, SOB, HAs, visual changes, edema, N/V.   Review of Systems For ROS see HPI     Objective:   Physical Exam  Constitutional: She is oriented to person, place, and time. She appears well-developed and well-nourished. No distress.  HENT:  Head: Normocephalic and atraumatic.  Eyes: Conjunctivae and EOM are normal. Pupils are equal, round, and reactive to light.  Neck: Normal range of motion. Neck supple. No thyromegaly present.  Cardiovascular: Normal rate, regular rhythm, normal heart sounds and intact distal pulses.   No murmur heard. Pulmonary/Chest: Effort normal and breath sounds normal. No respiratory distress.  Abdominal: Soft. She exhibits no distension. There is no tenderness.  Musculoskeletal: She exhibits no edema.  Lymphadenopathy:    She has no cervical adenopathy.  Neurological: She is alert and oriented to person, place, and time.  Skin: Skin is warm and dry.  Psychiatric: She has a normal mood and affect. Her behavior is normal.  Vitals reviewed.         Assessment & Plan:

## 2015-04-27 NOTE — Progress Notes (Signed)
Pre visit review using our clinic review tool, if applicable. No additional management support is needed unless otherwise documented below in the visit note. 

## 2015-04-29 NOTE — Assessment & Plan Note (Signed)
Chronic problem.  Adequate control.  Applauded her efforts at exercise.  Encouraged her to continue healthy, low Na diet.  Currently asymptomatic.  Check labs.  No anticipated med changes.

## 2015-07-20 ENCOUNTER — Telehealth: Payer: Self-pay | Admitting: Family Medicine

## 2015-07-20 NOTE — Telephone Encounter (Signed)
-----   Message from Oneta Rack sent at 07/19/2015 10:07 AM EST ----- Regarding: CPE  Contact: [phone] Patient would like to schedule physical appointment

## 2015-07-20 NOTE — Telephone Encounter (Signed)
LM for pt to call back and schedule CPE

## 2015-10-25 ENCOUNTER — Other Ambulatory Visit: Payer: Self-pay | Admitting: Family Medicine

## 2015-10-25 ENCOUNTER — Other Ambulatory Visit: Payer: Self-pay

## 2015-10-25 DIAGNOSIS — Z1231 Encounter for screening mammogram for malignant neoplasm of breast: Secondary | ICD-10-CM

## 2015-10-25 MED FILL — LOSARTAN-HCTZ 100-12.5 MG T: 100-12.5 | 30 days supply | Qty: 30 | Fill #0

## 2015-10-25 NOTE — Telephone Encounter (Signed)
Medication filled to pharmacy as requested.   

## 2015-10-26 ENCOUNTER — Encounter: Payer: Self-pay | Admitting: Family Medicine

## 2015-10-26 ENCOUNTER — Ambulatory Visit (INDEPENDENT_AMBULATORY_CARE_PROVIDER_SITE_OTHER): Payer: Commercial Managed Care - PPO | Admitting: Family Medicine

## 2015-10-26 VITALS — BP 130/80 | HR 76 | Temp 98.1°F | Resp 16 | Ht 61.0 in | Wt 193.1 lb

## 2015-10-26 DIAGNOSIS — Z0000 Encounter for general adult medical examination without abnormal findings: Secondary | ICD-10-CM

## 2015-10-26 LAB — CBC WITH DIFFERENTIAL/PLATELET
Basophils Absolute: 0 10*3/uL (ref 0.0–0.1)
Basophils Relative: 1 % (ref 0–1)
Eosinophils Absolute: 0.1 10*3/uL (ref 0.0–0.7)
Eosinophils Relative: 2 % (ref 0–5)
HEMATOCRIT: 40.7 % (ref 36.0–46.0)
Hemoglobin: 13.4 g/dL (ref 12.0–15.0)
LYMPHS PCT: 45 % (ref 12–46)
Lymphs Abs: 2.2 10*3/uL (ref 0.7–4.0)
MCH: 27.6 pg (ref 26.0–34.0)
MCHC: 32.9 g/dL (ref 30.0–36.0)
MCV: 83.9 fL (ref 78.0–100.0)
MONO ABS: 0.3 10*3/uL (ref 0.1–1.0)
MPV: 9.5 fL (ref 8.6–12.4)
Monocytes Relative: 7 % (ref 3–12)
NEUTROS PCT: 45 % (ref 43–77)
Neutro Abs: 2.2 10*3/uL (ref 1.7–7.7)
Platelets: 318 10*3/uL (ref 150–400)
RBC: 4.85 MIL/uL (ref 3.87–5.11)
RDW: 13.4 % (ref 11.5–15.5)
WBC: 4.8 10*3/uL (ref 4.0–10.5)

## 2015-10-26 LAB — TSH: TSH: 1.58 mIU/L

## 2015-10-26 NOTE — Progress Notes (Signed)
   Subjective:    Patient ID: Laura Patrick, female    DOB: 30-May-1956, 60 y.o.   MRN: [id number]  HPI CPE- UTD on pap (due 2019), mammo is scheduled for next week, due for colonoscopy next year.     Review of Systems Patient reports no vision/ hearing changes, adenopathy,fever, weight change,  persistant/recurrent hoarseness , swallowing issues, chest pain, palpitations, edema, persistant/recurrent cough, hemoptysis, dyspnea (rest/exertional/paroxysmal nocturnal), gastrointestinal bleeding (melena, rectal bleeding), abdominal pain, significant heartburn, bowel changes, GU symptoms (dysuria, hematuria, incontinence), Gyn symptoms (abnormal  bleeding, pain),  syncope, focal weakness, memory loss, numbness & tingling, skin/hair/nail changes, abnormal bruising or bleeding, anxiety, or depression.     Objective:   Physical Exam General Appearance:    Alert, cooperative, no distress, appears stated age  Head:    Normocephalic, without obvious abnormality, atraumatic  Eyes:    PERRL, conjunctiva/corneas clear, EOM's intact, fundi    benign, both eyes  Ears:    Normal TM's and external ear canals, both ears  Nose:   Nares normal, septum midline, mucosa normal, no drainage    or sinus tenderness  Throat:   Lips, mucosa, and tongue normal; teeth and gums normal  Neck:   Supple, symmetrical, trachea midline, no adenopathy;    Thyroid: no enlargement/tenderness/nodules  Back:     Symmetric, no curvature, ROM normal, no CVA tenderness  Lungs:     Clear to auscultation bilaterally, respirations unlabored  Chest Wall:    No tenderness or deformity   Heart:    Regular rate and rhythm, S1 and S2 normal, no murmur, rub   or gallop  Breast Exam:    Deferred to mammo  Abdomen:     Soft, non-tender, bowel sounds active all four quadrants,    no masses, no organomegaly  Genitalia:    Deferred to GYN  Rectal:    Extremities:   Extremities normal, atraumatic, no cyanosis or edema  Pulses:   2+ and  symmetric all extremities  Skin:   Skin color, texture, turgor normal, no rashes or lesions  Lymph nodes:   Cervical, supraclavicular, and axillary nodes normal  Neurologic:   CNII-XII intact, normal strength, sensation and reflexes    throughout          Assessment & Plan:

## 2015-10-26 NOTE — Patient Instructions (Signed)
Follow up in 6 months to recheck BP We'll notify you of your lab results and make any changes if needed Continue to work on healthy diet and regular exercise- you can do it!!! Call with any questions or concerns Thanks for sticking with [place]! Happy Spring!!! (Happy Early Birthday!!!)

## 2015-10-26 NOTE — Progress Notes (Signed)
Pre visit review using our clinic review tool, if applicable. No additional management support is needed unless otherwise documented below in the visit note. 

## 2015-10-27 LAB — LIPID PANEL
CHOL/HDL RATIO: 2.9 ratio (ref <=5.0)
Cholesterol: 156 mg/dL (ref 125–200)
HDL: 54 mg/dL (ref >=46)
LDL Cholesterol: 85 mg/dL (ref <130)
TRIGLYCERIDES: 83 mg/dL (ref <150)
VLDL: 17 mg/dL (ref <30)

## 2015-10-27 LAB — BASIC METABOLIC PANEL
BUN: 12 mg/dL (ref 7–25)
CO2: 24 mmol/L (ref 20–31)
Calcium: 9.3 mg/dL (ref 8.6–10.4)
Chloride: 104 mmol/L (ref 98–110)
Creat: 0.78 mg/dL (ref 0.50–0.99)
Glucose, Bld: 79 mg/dL (ref 65–99)
Potassium: 3.9 mmol/L (ref 3.5–5.3)
Sodium: 142 mmol/L (ref 135–146)

## 2015-10-27 LAB — HEPATIC FUNCTION PANEL
ALT: 13 U/L (ref 6–29)
AST: 16 U/L (ref 10–35)
Albumin: 4.5 g/dL (ref 3.6–5.1)
Alkaline Phosphatase: 73 U/L (ref 33–130)
BILIRUBIN DIRECT: 0.1 mg/dL (ref <=0.2)
BILIRUBIN INDIRECT: 0.3 mg/dL (ref 0.2–1.2)
Total Bilirubin: 0.4 mg/dL (ref 0.2–1.2)
Total Protein: 7.9 g/dL (ref 6.1–8.1)

## 2015-10-27 LAB — VITAMIN D 25 HYDROXY (VIT D DEFICIENCY, FRACTURES): Vit D, 25-Hydroxy: 19 ng/mL — ABNORMAL LOW (ref 30–100)

## 2015-10-28 NOTE — Assessment & Plan Note (Signed)
Pt's PE WNL.  UTD on pap, colonoscopy.  Has mammo scheduled next week.  Check labs.  Anticipatory guidance provided.

## 2015-10-29 ENCOUNTER — Other Ambulatory Visit: Payer: Self-pay | Admitting: General Practice

## 2015-10-29 MED ORDER — VITAMIN D (ERGOCALCIFEROL) 1.25 MG (50000 UNIT) PO CAPS: 50000.0000 [IU] | ORAL_CAPSULE | ORAL | Status: DC

## 2015-11-05 ENCOUNTER — Ambulatory Visit
Admission: RE | Admit: 2015-11-05 | Discharge: 2015-11-05 | Disposition: A | Discharge status: Home / Self Care | Payer: Commercial Managed Care - PPO | Source: Ambulatory Visit

## 2015-11-05 DIAGNOSIS — Z1231 Encounter for screening mammogram for malignant neoplasm of breast: Secondary | ICD-10-CM

## 2015-12-18 MED FILL — LOSARTAN-HCTZ 100-12.5 MG T: 100-12.5 | 30 days supply | Qty: 30 | Fill #1 | Status: TO

## 2016-01-31 MED FILL — LOSARTAN-HCTZ 100-12.5 MG T: 100-12.5 | 30 days supply | Qty: 30 | Fill #0

## 2016-02-23 MED FILL — LOSARTAN-HCTZ 100-12.5 MG T: 100-12.5 | 30 days supply | Qty: 30 | Fill #1

## 2016-03-13 ENCOUNTER — Encounter: Payer: Self-pay | Admitting: Family Medicine

## 2016-03-13 MED ORDER — LOSARTAN POTASSIUM-HCTZ 100-12.5 MG PO TABS: 1.0000 | ORAL_TABLET | Freq: Every day | ORAL | 6 refills | Status: DC

## 2016-03-27 ENCOUNTER — Other Ambulatory Visit: Payer: Self-pay | Admitting: Family Medicine

## 2016-03-27 MED FILL — LOSARTAN-HCTZ 100-12.5 MG T: 100-12.5 | 30 days supply | Qty: 30 | Fill #0

## 2016-04-18 ENCOUNTER — Ambulatory Visit (INDEPENDENT_AMBULATORY_CARE_PROVIDER_SITE_OTHER): Payer: Commercial Managed Care - PPO | Admitting: Family Medicine

## 2016-04-18 ENCOUNTER — Encounter: Payer: Self-pay | Admitting: Family Medicine

## 2016-04-18 VITALS — BP 134/84 | HR 72 | Temp 98.1°F | Resp 16 | Ht 61.0 in | Wt 193.4 lb

## 2016-04-18 DIAGNOSIS — Z23 Encounter for immunization: Secondary | ICD-10-CM

## 2016-04-18 DIAGNOSIS — I10 Essential (primary) hypertension: Secondary | ICD-10-CM | POA: Diagnosis not present

## 2016-04-18 NOTE — Progress Notes (Signed)
   Subjective:    Patient ID: Laura Patrick, female    DOB: 04-03-1956, 60 y.o.   MRN: [id number]  HPI HTN- chronic problem.  On Losartan HCTZ daily w/ adequate control today.  Pt is attempting to work on healthy diet.  Denies CP, SOB, HAs, visual changes, edema.   Review of Systems For ROS see HPI     Objective:   Physical Exam  Constitutional: She is oriented to person, place, and time. She appears well-developed and well-nourished. No distress.  HENT:  Head: Normocephalic and atraumatic.  Eyes: Conjunctivae and EOM are normal. Pupils are equal, round, and reactive to light.  Neck: Normal range of motion. Neck supple. No thyromegaly present.  Cardiovascular: Normal rate, regular rhythm, normal heart sounds and intact distal pulses.   No murmur heard. Pulmonary/Chest: Effort normal and breath sounds normal. No respiratory distress.  Abdominal: Soft. She exhibits no distension. There is no tenderness.  Musculoskeletal: She exhibits no edema.  Lymphadenopathy:    She has no cervical adenopathy.  Neurological: She is alert and oriented to person, place, and time.  Skin: Skin is warm and dry.  Psychiatric: She has a normal mood and affect. Her behavior is normal.  Vitals reviewed.         Assessment & Plan:

## 2016-04-18 NOTE — Assessment & Plan Note (Signed)
Chronic problem.  Adequate control on Losartan HCTZ.  Currently asymptomatic.  Check labs.  No anticipated med changes.

## 2016-04-18 NOTE — Patient Instructions (Signed)
Schedule your complete physical in 6 months We'll notify you of your lab results and make any changes if needed Continue to work on healthy diet and regular exercise- you can do it! Call with any questions or concerns Happy Fall!!! 

## 2016-04-18 NOTE — Progress Notes (Signed)
Pre visit review using our clinic review tool, if applicable. No additional management support is needed unless otherwise documented below in the visit note. 

## 2016-04-23 ENCOUNTER — Telehealth: Payer: Self-pay | Admitting: Emergency Medicine

## 2016-04-23 NOTE — Telephone Encounter (Signed)
-----   Message from Midge Minium, MD sent at 04/23/2016  7:43 AM EDT ----- Please have pt return for labs  Thanks!  ----- Message ----- From: SYSTEM Sent: 04/23/2016  12:05 AM To: Midge Minium, MD

## 2016-04-23 NOTE — Telephone Encounter (Signed)
Left message on machine advising patient she is due for labs ordered on 04/18/16. Please call to schedule lab visit. Orders are still active in chart.

## 2016-04-25 ENCOUNTER — Other Ambulatory Visit (INDEPENDENT_AMBULATORY_CARE_PROVIDER_SITE_OTHER): Payer: Commercial Managed Care - PPO

## 2016-04-25 DIAGNOSIS — I158 Other secondary hypertension: Secondary | ICD-10-CM | POA: Diagnosis not present

## 2016-04-25 LAB — BASIC METABOLIC PANEL
BUN: 16 mg/dL (ref 6–23)
CALCIUM: 9.8 mg/dL (ref 8.4–10.5)
CO2: 28 mEq/L (ref 19–32)
Chloride: 106 mEq/L (ref 96–112)
Creatinine, Ser: 0.98 mg/dL (ref 0.40–1.20)
GFR: 73.85 mL/min (ref >60.00)
Glucose, Bld: 83 mg/dL (ref 70–99)
Potassium: 3.5 mEq/L (ref 3.5–5.1)
SODIUM: 142 meq/L (ref 135–145)

## 2016-04-25 LAB — CBC WITH DIFFERENTIAL/PLATELET
BASOS ABS: 0 10*3/uL (ref 0.0–0.1)
Basophils Relative: 0.8 % (ref 0.0–3.0)
Eosinophils Absolute: 0.1 10*3/uL (ref 0.0–0.7)
Eosinophils Relative: 1.9 % (ref 0.0–5.0)
HCT: 41.3 % (ref 36.0–46.0)
HEMOGLOBIN: 14 g/dL (ref 12.0–15.0)
LYMPHS PCT: 45.4 % (ref 12.0–46.0)
Lymphs Abs: 2.4 10*3/uL (ref 0.7–4.0)
MCHC: 33.9 g/dL (ref 30.0–36.0)
MCV: 82.7 fl (ref 78.0–100.0)
MONOS PCT: 10.4 % (ref 3.0–12.0)
Monocytes Absolute: 0.5 10*3/uL (ref 0.1–1.0)
Neutro Abs: 2.2 10*3/uL (ref 1.4–7.7)
Neutrophils Relative %: 41.5 % — ABNORMAL LOW (ref 43.0–77.0)
Platelets: 288 10*3/uL (ref 150.0–400.0)
RBC: 4.99 Mil/uL (ref 3.87–5.11)
RDW: 14.2 % (ref 11.5–15.5)
WBC: 5.2 10*3/uL (ref 4.0–10.5)

## 2016-04-25 MED FILL — LOSARTAN-HCTZ 100-12.5 MG T: 100-12.5 | 30 days supply | Qty: 30 | Fill #1

## 2016-05-29 MED FILL — LOSARTAN-HCTZ 100-12.5 MG T: 100-12.5 | 30 days supply | Qty: 30 | Fill #2

## 2016-06-18 ENCOUNTER — Ambulatory Visit (INDEPENDENT_AMBULATORY_CARE_PROVIDER_SITE_OTHER): Payer: Commercial Managed Care - PPO | Admitting: Physician Assistant

## 2016-06-18 ENCOUNTER — Encounter: Payer: Self-pay | Admitting: Family Medicine

## 2016-06-18 ENCOUNTER — Encounter: Payer: Self-pay | Admitting: Physician Assistant

## 2016-06-18 VITALS — BP 132/84 | HR 77 | Temp 98.4°F | Resp 16 | Ht 61.0 in | Wt 194.0 lb

## 2016-06-18 DIAGNOSIS — B353 Tinea pedis: Secondary | ICD-10-CM | POA: Diagnosis not present

## 2016-06-18 DIAGNOSIS — L03032 Cellulitis of left toe: Secondary | ICD-10-CM | POA: Diagnosis not present

## 2016-06-18 DIAGNOSIS — T148XXA Other injury of unspecified body region, initial encounter: Secondary | ICD-10-CM

## 2016-06-18 MED ORDER — CLOTRIMAZOLE-BETAMETHASONE 1-0.05 % EX LOTN: TOPICAL_LOTION | Freq: Two times a day (BID) | CUTANEOUS | 0 refills | Status: DC

## 2016-06-18 MED ORDER — CEPHALEXIN 500 MG PO CAPS: 500.0000 mg | ORAL_CAPSULE | Freq: Two times a day (BID) | ORAL | 0 refills | Status: DC

## 2016-06-18 NOTE — Patient Instructions (Signed)
Please apply the lotrisone ointment to the feet as directed. Keep skin clean and dry. Alternate shoes and let each pain dry out well.  Take the antibiotic as directed. If you note symptoms are not resolving with antibiotic, or if anything worsens, please come see [place].  Follow-up on Monday.

## 2016-06-18 NOTE — Progress Notes (Signed)
Pre visit review using our clinic review tool, if applicable. No additional management support is needed unless otherwise documented below in the visit note. 

## 2016-06-19 NOTE — Progress Notes (Signed)
Patient presents to clinic today c/o itching and scaling of feet bilaterally worse on L foot.  Symptoms present x 2 weeks. Notes scaling is in the webbing between toes. Notes a blister developing on her third toe. Denies burn, trauma or injury to the area. Notes some redness of third phalanx but denies pain or tenderness. Denies swelling. Has been keeping area clean and dry.   Past Medical History:  Diagnosis Date  . Hypertension   . Ovarian cyst     Current Outpatient Prescriptions on File Prior to Visit  Medication Sig Dispense Refill  . aspirin EC 81 MG tablet Take 81 mg by mouth daily.    Marland Kitchen losartan-hydrochlorothiazide (HYZAAR) 100-12.5 MG tablet TAKE 1 TABLET BY MOUTH DAILY. 30 tablet 6   No current facility-administered medications on file prior to visit.     No Known Allergies  Family History  Problem Relation Age of Onset  . Heart disease Mother     CHF  . Hypertension Father   . Diabetes Father     Social History   Social History  . Marital status: Married    Spouse name: N/A  . Number of children: N/A  . Years of education: N/A   Social History Main Topics  . Smoking status: Never Smoker  . Smokeless tobacco: Never Used  . Alcohol use No  . Drug use: No  . Sexual activity: Not Asked   Other Topics Concern  . None   Social History Narrative  . None    Review of Systems - See HPI.  All other ROS are negative.  BP 132/84   Pulse 77   Temp 98.4 F (36.9 C) (Oral)   Resp 16   Ht 5\' 1"  (1.549 m)   Wt 194 lb (88 kg)   SpO2 98%   BMI 36.66 kg/m   Physical Exam  Constitutional: She is well-developed, well-nourished, and in no distress.  HENT:  Head: Normocephalic and atraumatic.  Cardiovascular: Normal rate, regular rhythm, normal heart sounds and intact distal pulses.   Pulmonary/Chest: Effort normal and breath sounds normal. No respiratory distress. She has no wheezes. She has no rales. She exhibits no tenderness.  Neurological: She is  alert.  Skin:     Vitals reviewed.   Recent Results (from the past 2160 hour(s))  Basic Metabolic Panel (BMET)     Status: None   Collection Time: 04/25/16  1:29 PM  Result Value Ref Range   Sodium 142 135 - 145 mEq/L   Potassium 3.5 3.5 - 5.1 mEq/L   Chloride 106 96 - 112 mEq/L   CO2 28 19 - 32 mEq/L   Glucose, Bld 83 70 - 99 mg/dL   BUN 16 6 - 23 mg/dL   Creatinine, Ser 0.98 0.40 - 1.20 mg/dL   Calcium 9.8 8.4 - 10.5 mg/dL   GFR 73.85 >60.00 mL/min  CBC w/Diff     Status: Abnormal   Collection Time: 04/25/16  1:29 PM  Result Value Ref Range   WBC 5.2 4.0 - 10.5 K/uL   RBC 4.99 3.87 - 5.11 Mil/uL   Hemoglobin 14.0 12.0 - 15.0 g/dL   HCT 41.3 36.0 - 46.0 %   MCV 82.7 78.0 - 100.0 fl   MCHC 33.9 30.0 - 36.0 g/dL   RDW 14.2 11.5 - 15.5 %   Platelets 288.0 150.0 - 400.0 K/uL   Neutrophils Relative % 41.5 (L) 43.0 - 77.0 %   Lymphocytes Relative 45.4 12.0 - 46.0 %  Monocytes Relative 10.4 3.0 - 12.0 %   Eosinophils Relative 1.9 0.0 - 5.0 %   Basophils Relative 0.8 0.0 - 3.0 %   Neutro Abs 2.2 1.4 - 7.7 K/uL   Lymphs Abs 2.4 0.7 - 4.0 K/uL   Monocytes Absolute 0.5 0.1 - 1.0 K/uL   Eosinophils Absolute 0.1 0.0 - 0.7 K/uL   Basophils Absolute 0.0 0.0 - 0.1 K/uL    Assessment/Plan: 1. Cellulitis of toe of left foot Question the start giving erythema noted. ROM intact. Rx Keflex. Supportive measures reviewed. FU scheduled.  - cephALEXin (KEFLEX) 500 MG capsule; Take 1 capsule (500 mg total) by mouth 2 (two) times daily.  Dispense: 14 capsule; Refill: 0  2. Blister Area cleaned with alcohol wipe. Used sterile 23 gauged needle to remove fluid while keeping covering mostly intact. Only serous fluid noted. Suspect secondary to friction. Discussed proper footwear, hygiene and ways to prevent continued friction. FU scheduled.  3. Tinea pedis of both feet Rx lotrisone. Supportive measures reviewed.      Leeanne Rio, PA-C

## 2016-07-01 MED FILL — LOSARTAN-HCTZ 100-12.5 MG T: 100-12.5 | 30 days supply | Qty: 30 | Fill #3

## 2016-08-05 MED FILL — LOSARTAN-HCTZ 100-12.5 MG T: 100-12.5 | 30 days supply | Qty: 30 | Fill #4

## 2016-08-13 ENCOUNTER — Encounter: Payer: Self-pay | Admitting: Internal Medicine

## 2016-09-02 ENCOUNTER — Emergency Department (HOSPITAL_COMMUNITY)
Admission: EM | Admit: 2016-09-02 | Discharge: 2016-09-02 | Disposition: A | Discharge status: Home / Self Care | Payer: Commercial Managed Care - PPO | Attending: Emergency Medicine | Admitting: Emergency Medicine

## 2016-09-02 ENCOUNTER — Encounter (HOSPITAL_COMMUNITY): Payer: Self-pay | Admitting: Emergency Medicine

## 2016-09-02 DIAGNOSIS — Z79899 Other long term (current) drug therapy: Secondary | ICD-10-CM | POA: Insufficient documentation

## 2016-09-02 DIAGNOSIS — Z7982 Long term (current) use of aspirin: Secondary | ICD-10-CM | POA: Insufficient documentation

## 2016-09-02 DIAGNOSIS — I10 Essential (primary) hypertension: Secondary | ICD-10-CM | POA: Diagnosis present

## 2016-09-02 MED ORDER — LOSARTAN POTASSIUM-HCTZ 100-25 MG PO TABS
1.0000 | ORAL_TABLET | Freq: Every day | ORAL | 2 refills | Status: DC
Start: 2016-09-02 — End: 2016-09-05

## 2016-09-02 MED ORDER — METOPROLOL TARTRATE 50 MG PO TABS: 50.0000 mg | ORAL_TABLET | Freq: Two times a day (BID) | ORAL | 0 refills | Status: DC

## 2016-09-02 NOTE — ED Notes (Signed)
PT reports BP was also high Saturday. PT reports reading was 190/102 at home Saturday.

## 2016-09-02 NOTE — ED Notes (Signed)
Care handoff to Deeann Dowse RN

## 2016-09-02 NOTE — ED Triage Notes (Signed)
Pt states "i was at the eye doctor and my blood pressure was really high". Pt BP was in the 200s at office. Hx of htn on meds, taking them as directed. Denies symptoms.

## 2016-09-02 NOTE — ED Provider Notes (Signed)
[facility] DEPT Provider Note   CSN: [id number] Arrival date & time: 09/02/16  1637  By signing my name below, I, Laura Patrick, attest that this documentation has been prepared under the direction and in the presence of [name], MD . Electronically Signed: Evelene Patrick, Scribe. 09/02/2016. 6:43 PM.  History   Chief Complaint Chief Complaint  Patient presents with  . Hypertension    The history is provided by the patient. No language interpreter was used.    HPI Comments:  Laura Patrick is a 61 y.o. female with a history of HTN, who presents to the Emergency Department complaining of elevated BP. She has been compliant with her Losartan 100 mg which she has been taking for 2 years but notes increase in dietary salt and has not been exercising. Pt presents from the eye doctor where her BP was in the 123456 systolically. She denies vision changes and HA. Pt has no other acute complaints or associated symptoms at this time.   PCP-[facility]  Past Medical History:  Diagnosis Date  . Hypertension   . Ovarian cyst     Patient Active Problem List   Diagnosis Date Noted  . Physical exam 08/10/2014  . Encounter for Papanicolaou smear of cervix 08/10/2014  . Obesity (BMI 30-39.9) 10/14/2013  . Pain in limb 05/23/2010  . PES PLANUS 05/23/2010  . PARESTHESIA 05/23/2010  . HYPOPOTASSEMIA 08/30/2008  . HYPERLIPIDEMIA NEC/NOS 04/12/2007  . SYNDROME, CARPAL TUNNEL 04/12/2007  . Essential hypertension 04/12/2007    Past Surgical History:  Procedure Laterality Date  . OOPHORECTOMY Right 1988  . OVARIAN CYST REMOVAL      OB History    No data available       Home Medications    Prior to Admission medications   Medication Sig Start Date End Date Taking? Authorizing Provider  aspirin EC 81 MG tablet Take 81 mg by mouth daily.    Historical Provider, MD  cephALEXin (KEFLEX) 500 MG capsule Take 1 capsule (500 mg total) by mouth 2 (two) times daily. 06/18/16   Brunetta Jeans, PA-C  clotrimazole-betamethasone (LOTRISONE) lotion Apply topically 2 (two) times daily. 06/18/16   Brunetta Jeans, PA-C  losartan-hydrochlorothiazide (HYZAAR) 100-12.5 MG tablet TAKE 1 TABLET BY MOUTH DAILY. 03/27/16   Midge Minium, MD    Family History Family History  Problem Relation Age of Onset  . Heart disease Mother     CHF  . Hypertension Father   . Diabetes Father     Social History Social History  Substance Use Topics  . Smoking status: Never Smoker  . Smokeless tobacco: Never Used  . Alcohol use No     Allergies   Patient has no known allergies.   Review of Systems Review of Systems  Constitutional:       +elevated BP  Eyes: Negative for visual disturbance.  Neurological: Negative for dizziness, weakness, light-headedness, numbness and headaches.  All other systems reviewed and are negative.    Physical Exam Updated Vital Signs BP 171/94   Pulse 106   Temp 98.5 F (36.9 C) (Oral)   Resp 16   SpO2 99%   Physical Exam  Constitutional: She is oriented to person, place, and time. She appears well-developed and well-nourished. No distress.  HENT:  Head: Normocephalic and atraumatic.  Eyes: Conjunctivae are normal.  Pupils are dilated  Cardiovascular: Normal rate and regular rhythm.   84 bpm and regular  Pulmonary/Chest: Effort normal and breath sounds normal. No respiratory  distress.  Abdominal: She exhibits no distension.  Neurological: She is alert and oriented to person, place, and time.  Skin: Skin is warm and dry.  Psychiatric: She has a normal mood and affect.  Nursing note and vitals reviewed.  ED Treatments / Results  DIAGNOSTIC STUDIES:  Oxygen Saturation is 99% on RA, normal by my interpretation.    COORDINATION OF CARE:  6:41 PM Discussed treatment plan with pt at bedside and pt agreed to plan.  Labs (all labs ordered are listed, but only abnormal results are displayed) Labs Reviewed - No data to display  EKG   EKG Interpretation None       Radiology No results found.  Procedures Procedures (including critical care time)  Medications Ordered in ED Medications - No data to display   Initial Impression / Assessment and Plan / ED Course  I have reviewed the triage vital signs and the nursing notes.  Pertinent labs & imaging results that were available during my care of the patient were reviewed by me and considered in my medical decision making (see chart for details).     1 patient presents with hypertension. She has known hypertension reports taking her medications. Blood pressure noted elevated at her ophthalmologist today. Blood pressure is 171/94 here. She has no other complaints. She denies headache, neurological changes, chest pain, or dyspnea. She is currently taking losartan hydrochlorothiazide 100 mg and 12.5. I'm increasing the dose to 25 mg of the hydrochlorothiazide component. She is also being placed on Lopressor 25 mg a day. She will follow-up with her primary care Dr. for recheck of her blood pressure within the next week. We've discussed return precautions, lifestyle intervention, and need for follow-up and she voices understanding.  Final Clinical Impressions(s) / ED Diagnoses   Final diagnoses:  Hypertension, unspecified type    New Prescriptions New Prescriptions   LOSARTAN-HYDROCHLOROTHIAZIDE (HYZAAR) 100-25 MG TABLET    Take 1 tablet by mouth daily.   METOPROLOL (LOPRESSOR) 50 MG TABLET    Take 1 tablet (50 mg total) by mouth 2 (two) times daily.   I personally performed the services described in this documentation, which was scribed in my presence. The recorded information has been reviewed and considered.    [name], MD 09/02/16 [phone]

## 2016-09-02 NOTE — Discharge Instructions (Signed)
Please recheck your blood pressure with your Dr. this week.

## 2016-09-02 NOTE — ED Notes (Signed)
Patient Alert and oriented X4. Stable and ambulatory. Patient verbalized understanding of the discharge instructions.  Patient belongings were taken by the patient.  

## 2016-09-03 ENCOUNTER — Encounter: Payer: Self-pay | Admitting: Physician Assistant

## 2016-09-03 NOTE — Telephone Encounter (Signed)
Forwarding to you as PCP. Not sure why message was sent to me.

## 2016-09-05 ENCOUNTER — Ambulatory Visit (INDEPENDENT_AMBULATORY_CARE_PROVIDER_SITE_OTHER): Payer: Commercial Managed Care - PPO | Admitting: Family Medicine

## 2016-09-05 ENCOUNTER — Encounter: Payer: Self-pay | Admitting: Family Medicine

## 2016-09-05 VITALS — BP 150/88 | HR 80 | Temp 98.1°F | Resp 16 | Ht 61.0 in | Wt 191.0 lb

## 2016-09-05 DIAGNOSIS — I10 Essential (primary) hypertension: Secondary | ICD-10-CM | POA: Diagnosis not present

## 2016-09-05 LAB — CBC WITH DIFFERENTIAL/PLATELET
Basophils Absolute: 41 cells/uL (ref 0–200)
Basophils Relative: 1 %
Eosinophils Absolute: 82 cells/uL (ref 15–500)
Eosinophils Relative: 2 %
HCT: 41.3 % (ref 35.0–45.0)
Hemoglobin: 13.7 g/dL (ref 11.7–15.5)
Lymphocytes Relative: 51 %
Lymphs Abs: 2091 cells/uL (ref 850–3900)
MCH: 28 pg (ref 27.0–33.0)
MCHC: 33.2 g/dL (ref 32.0–36.0)
MCV: 84.5 fL (ref 80.0–100.0)
MONOS PCT: 14 %
MPV: 9.8 fL (ref 7.5–12.5)
Monocytes Absolute: 574 cells/uL (ref 200–950)
NEUTROS ABS: 1312 {cells}/uL — AB (ref 1500–7800)
Neutrophils Relative %: 32 %
PLATELETS: 286 10*3/uL (ref 140–400)
RBC: 4.89 MIL/uL (ref 3.80–5.10)
RDW: 14.2 % (ref 11.0–15.0)
WBC: 4.1 10*3/uL (ref 3.8–10.8)

## 2016-09-05 LAB — BASIC METABOLIC PANEL
BUN: 14 mg/dL (ref 7–25)
CHLORIDE: 104 mmol/L (ref 98–110)
CO2: 31 mmol/L (ref 20–31)
CREATININE: 0.9 mg/dL (ref 0.50–0.99)
Calcium: 9.7 mg/dL (ref 8.6–10.4)
Glucose, Bld: 102 mg/dL — ABNORMAL HIGH (ref 65–99)
Potassium: 3.8 mmol/L (ref 3.5–5.3)
SODIUM: 141 mmol/L (ref 135–146)

## 2016-09-05 LAB — TSH: TSH: 1.17 mIU/L

## 2016-09-05 MED ORDER — METOPROLOL SUCCINATE ER 50 MG PO TB24: 50.0000 mg | ORAL_TABLET | Freq: Every day | ORAL | 3 refills | Status: DC

## 2016-09-05 MED ORDER — VALSARTAN-HYDROCHLOROTHIAZIDE 320-12.5 MG PO TABS: 1.0000 | ORAL_TABLET | Freq: Every day | ORAL | 3 refills | Status: DC

## 2016-09-05 MED FILL — VALSARTAN-HCTZ 320-12.5 MG: 320-12.5 | 30 days supply | Qty: 30 | Fill #0

## 2016-09-05 MED FILL — METOPROLOL SUCC ER 50 MG TA: 50 | 30 days supply | Qty: 30 | Fill #0

## 2016-09-05 NOTE — Progress Notes (Signed)
Pre visit review using our clinic review tool, if applicable. No additional management support is needed unless otherwise documented below in the visit note. 

## 2016-09-05 NOTE — Progress Notes (Signed)
   Subjective:    Patient ID: Laura Patrick, female    DOB: 10/16/1955, 61 y.o.   MRN: [id number]  HPI HTN- pt was at eye doctor on 1/30 and BP was 200/100.  They sent her to ER where BP was '171/90 something' and then 165/90.  ER started Metoprolol twice daily.  Pt was asymptomatic at the time- no HA, visual changes, CP, SOB.  Pt admits to high salt diet recently.  Pt is on Losartan HCTZ daily.     Review of Systems For ROS see HPI     Objective:   Physical Exam  Constitutional: She is oriented to person, place, and time. She appears well-developed and well-nourished. No distress.  HENT:  Head: Normocephalic and atraumatic.  Eyes: Conjunctivae and EOM are normal. Pupils are equal, round, and reactive to light.  Neck: Normal range of motion. Neck supple. No thyromegaly present.  Cardiovascular: Normal rate, regular rhythm, normal heart sounds and intact distal pulses.   No murmur heard. Pulmonary/Chest: Effort normal and breath sounds normal. No respiratory distress.  Abdominal: Soft. She exhibits no distension. There is no tenderness.  Musculoskeletal: She exhibits no edema.  Lymphadenopathy:    She has no cervical adenopathy.  Neurological: She is alert and oriented to person, place, and time.  Skin: Skin is warm and dry.  Psychiatric: She has a normal mood and affect. Her behavior is normal.  Vitals reviewed.         Assessment & Plan:

## 2016-09-05 NOTE — Assessment & Plan Note (Signed)
Deteriorated.  Pt's BP has been quite elevated.  Thankfully she has been asymptomatic.  Will STOP the Losartan and switch to Valsartan which is more potent.  Will also change to extended release metoprolol to make it easier to take.  Stressed need for low salt diet and regular exercise.  Will follow closely.

## 2016-09-05 NOTE — Patient Instructions (Signed)
Follow up in 3-4 weeks to recheck BP STOP the Losartan HCTZ START the Valsartan HCTZ once daily SWITCH to the extended release Metoprolol- once daily Drink plenty of water LIMIT your salt intake Try and get regular exercise Call with any questions or concerns Hang in there! Happy Pardon's Day!  (your holiday!)

## 2016-09-11 ENCOUNTER — Emergency Department (HOSPITAL_COMMUNITY)
Admission: EM | Admit: 2016-09-11 | Discharge: 2016-09-11 | Disposition: A | Discharge status: Home / Self Care | Payer: Commercial Managed Care - PPO | Attending: Emergency Medicine | Admitting: Emergency Medicine

## 2016-09-11 ENCOUNTER — Encounter (HOSPITAL_COMMUNITY): Payer: Self-pay

## 2016-09-11 DIAGNOSIS — Z79899 Other long term (current) drug therapy: Secondary | ICD-10-CM | POA: Diagnosis not present

## 2016-09-11 DIAGNOSIS — I10 Essential (primary) hypertension: Secondary | ICD-10-CM | POA: Insufficient documentation

## 2016-09-11 DIAGNOSIS — R51 Headache: Secondary | ICD-10-CM | POA: Diagnosis present

## 2016-09-11 DIAGNOSIS — Z7982 Long term (current) use of aspirin: Secondary | ICD-10-CM | POA: Insufficient documentation

## 2016-09-11 DIAGNOSIS — E876 Hypokalemia: Secondary | ICD-10-CM | POA: Diagnosis not present

## 2016-09-11 LAB — I-STAT CHEM 8, ED
BUN: 12 mg/dL (ref 6–20)
CALCIUM ION: 1.17 mmol/L (ref 1.15–1.40)
CREATININE: 0.9 mg/dL (ref 0.44–1.00)
Chloride: 100 mmol/L — ABNORMAL LOW (ref 101–111)
GLUCOSE: 121 mg/dL — AB (ref 65–99)
HCT: 43 % (ref 36.0–46.0)
Hemoglobin: 14.6 g/dL (ref 12.0–15.0)
Potassium: 3 mmol/L — ABNORMAL LOW (ref 3.5–5.1)
SODIUM: 139 mmol/L (ref 135–145)
TCO2: 26 mmol/L (ref 0–100)

## 2016-09-11 MED ORDER — POTASSIUM CHLORIDE CRYS ER 20 MEQ PO TBCR
40.0000 meq | EXTENDED_RELEASE_TABLET | Freq: Once | ORAL | Status: AC
  Administered 2016-09-11: 40 meq via ORAL
  Filled 2016-09-11: qty 2

## 2016-09-11 MED ORDER — POTASSIUM CHLORIDE ER 10 MEQ PO TBCR: 10.0000 meq | EXTENDED_RELEASE_TABLET | Freq: Every day | ORAL | 0 refills | Status: DC

## 2016-09-11 NOTE — ED Triage Notes (Signed)
Pt states her bp meds were changed on Friday.  Pt started taking valsartan on Saturday and metoprolol 50.  Pt states her meds were changed due to bp running high last week, seen at [facility] For same.  Pt states she has been dizzy and nauseated since starting the meds.

## 2016-09-11 NOTE — ED Notes (Signed)
Pt verbalized understanding of discharge instructions. Pt ambulatory to waiting room.  

## 2016-09-11 NOTE — Discharge Instructions (Signed)

## 2016-09-11 NOTE — ED Provider Notes (Signed)
[facility] DEPT Provider Note   CSN: [id number] Arrival date & time: 09/11/16  0002   By signing my name below, I, Laura Patrick, attest that this documentation has been prepared under the direction and in the presence of Ripley Fraise, MD . Electronically Signed: Dolores Patrick, Scribe. 09/11/2016. 12:47 AM.  History   Chief Complaint Chief Complaint  Patient presents with  . Hypertension    Hypertension  This is a chronic problem. The current episode started more than 2 days ago. The problem occurs constantly. The problem has not changed since onset.Associated symptoms include headaches. Pertinent negatives include no chest pain, no abdominal pain and no shortness of breath. Associated symptoms comments: Dizziness. The symptoms are aggravated by exertion. Nothing relieves the symptoms. Treatments tried: Normal BP medications.     HPI Comments:  Laura Patrick is a 61 y.o. female who presents to the Emergency Department complaining of constant, unchanged hypertension beginning about 10 days ago. Pt states that she was seen for an eye exam  where her BP was at 220/118. She notes she was seen in the ED 8 days ago, where she had a BP of 171/94. She saw her PCP who changed her BP medications by taking her off her normal Losartan, switching her to Valsartan, and adding 50mg  of metoprolol. Pt notes that her BP has not changed, and she has developed mild headaches and dizziness.  She denies any visual disturbance, CP, SOB, numbness or weakness.  She reports HA is resolved She reports she did skip most recent dose of metoprolol due to mild HA No focal weakness    Past Medical History:  Diagnosis Date  . Hypertension   . Ovarian cyst     Patient Active Problem List   Diagnosis Date Noted  . Physical exam 08/10/2014  . Encounter for Papanicolaou smear of cervix 08/10/2014  . Obesity (BMI 30-39.9) 10/14/2013  . Pain in limb 05/23/2010  . PES PLANUS 05/23/2010  . PARESTHESIA  05/23/2010  . HYPOPOTASSEMIA 08/30/2008  . HYPERLIPIDEMIA NEC/NOS 04/12/2007  . SYNDROME, CARPAL TUNNEL 04/12/2007  . Essential hypertension 04/12/2007    Past Surgical History:  Procedure Laterality Date  . OOPHORECTOMY Right 1988  . OVARIAN CYST REMOVAL      OB History    No data available       Home Medications    Prior to Admission medications   Medication Sig Start Date End Date Taking? Authorizing Provider  aspirin EC 81 MG tablet Take 81 mg by mouth daily.   Yes Historical Provider, MD  metoprolol succinate (TOPROL-XL) 50 MG 24 hr tablet Take 1 tablet (50 mg total) by mouth daily. Take with or immediately following a meal. 09/05/16  Yes Midge Minium, MD  valsartan-hydrochlorothiazide (DIOVAN-HCT) 320-12.5 MG tablet Take 1 tablet by mouth daily. 09/05/16  Yes Midge Minium, MD  clotrimazole-betamethasone (LOTRISONE) lotion Apply topically 2 (two) times daily. 06/18/16   Brunetta Jeans, PA-C    Family History Family History  Problem Relation Age of Onset  . Heart disease Mother     CHF  . Hypertension Father   . Diabetes Father     Social History Social History  Substance Use Topics  . Smoking status: Never Smoker  . Smokeless tobacco: Never Used  . Alcohol use No     Allergies   Patient has no known allergies.   Review of Systems Review of Systems  Eyes: Negative for visual disturbance.  Respiratory: Negative for shortness of breath.  Cardiovascular: Negative for chest pain.  Gastrointestinal: Positive for diarrhea. Negative for abdominal pain.  Musculoskeletal: Negative for back pain.  Neurological: Positive for headaches. Negative for weakness and numbness.  All other systems reviewed and are negative.    Physical Exam Updated Vital Signs BP (!) 216/115 (BP Location: Left Arm)   Pulse 104   Temp 97.9 F (36.6 C) (Oral)   Resp 20   Ht 5' (1.524 m)   Wt 192 lb (87.1 kg)   SpO2 99%   BMI 37.50 kg/m   Physical  Exam CONSTITUTIONAL: Well developed/well nourished HEAD: Normocephalic/atraumatic EYES: EOMI/PERRL, no nystagmus, no ptosis ENMT: Mucous membranes moist NECK: supple no meningeal signs, no bruits CV: S1/S2 noted, no murmurs/rubs/gallops noted LUNGS: Lungs are clear to auscultation bilaterally, no apparent distress ABDOMEN: soft, nontender, no rebound or guarding GU:no cva tenderness NEURO:Awake/alert, face symmetric, no arm or leg drift is noted Equal 5/5 strength with shoulder abduction, elbow flex/extension, wrist flex/extension in upper extremities and equal hand grips bilaterally Equal 5/5 strength with hip flexion,knee flex/extension, foot dorsi/plantar flexion Cranial nerves 3/4/5/6/02/09/09/11/12 tested and intact Gait normal without ataxia No past pointing Sensation to light touch intact in all extremities EXTREMITIES: pulses normal, full ROM SKIN: warm, color normal PSYCH: no abnormalities of mood noted   ED Treatments / Results  DIAGNOSTIC STUDIES:  Oxygen Saturation is 99% on RA, normal by my interpretation.    COORDINATION OF CARE:  12:52 AM Discussed treatment plan with pt at bedside and pt agreed to plan.  Labs (all labs ordered are listed, but only abnormal results are displayed) Labs Reviewed  I-STAT CHEM 8, ED - Abnormal; Notable for the following:       Result Value   Potassium 3.0 (*)    Chloride 100 (*)    Glucose, Bld 121 (*)    All other components within normal limits    EKG  EKG Interpretation  Date/Time:  Thursday September 11 2016 01:10:53 EST Ventricular Rate:  76 PR Interval:    QRS Duration: 106 QT Interval:  438 QTC Calculation: 493 R Axis:   -24 Text Interpretation:  Sinus arrhythmia Probable left atrial enlargement Borderline left axis deviation Probable anteroseptal infarct, old Confirmed by Christy Gentles  MD, Masa Lubin ([id number]) on 09/11/2016 1:16:05 AM       Radiology No results found.  Procedures Procedures (including critical care  time)  Medications Ordered in ED Medications  potassium chloride SA (K-DUR,KLOR-CON) CR tablet 40 mEq (40 mEq Oral Given 09/11/16 0153)     Initial Impression / Assessment and Plan / ED Course  I have reviewed the triage vital signs and the nursing notes.  Pertinent labs   results that were available during my care of the patient were reviewed by me and considered in my medical decision making (see chart for details).     1:57 AM Pt well appearing She recently had adjustments in BP meds starting on 2/3 She admits to missing recent metoprolol dose - I encouraged her to restart this No signs of hypertensive emergency No cp No focal weakness  She is smiling, well appearing, ambulatory without difficulty Suspect mild HA/dizziness is likely from elevated BP Will start on short course of KCL Advised to check BP 1-2x per day, record it and call dr Birdie Riddle next week if no improvement  We discussed strict ER return precautions including unilateral weakness/severe CP/SOB Pt/family agreeable with plan   Final Clinical Impressions(s) / ED Diagnoses   Final diagnoses:  Essential hypertension  Hypokalemia  New Prescriptions New Prescriptions   POTASSIUM CHLORIDE (K-DUR) 10 MEQ TABLET    Take 1 tablet (10 mEq total) by mouth daily.  I personally performed the services described in this documentation, which was scribed in my presence. The recorded information has been reviewed and is accurate.        Ripley Fraise, MD 09/11/16 [phone]

## 2016-10-06 ENCOUNTER — Ambulatory Visit: Payer: Commercial Managed Care - PPO | Admitting: Family Medicine

## 2016-10-06 ENCOUNTER — Encounter: Payer: Self-pay | Admitting: Family Medicine

## 2016-10-06 ENCOUNTER — Ambulatory Visit (INDEPENDENT_AMBULATORY_CARE_PROVIDER_SITE_OTHER): Payer: Commercial Managed Care - PPO | Admitting: Family Medicine

## 2016-10-06 VITALS — BP 136/86 | HR 71 | Temp 98.7°F | Resp 16 | Ht 60.0 in | Wt 185.1 lb

## 2016-10-06 DIAGNOSIS — I10 Essential (primary) hypertension: Secondary | ICD-10-CM | POA: Diagnosis not present

## 2016-10-06 LAB — BASIC METABOLIC PANEL
BUN: 11 mg/dL (ref 7–25)
CALCIUM: 10 mg/dL (ref 8.6–10.4)
CHLORIDE: 104 mmol/L (ref 98–110)
CO2: 28 mmol/L (ref 20–31)
Creat: 0.91 mg/dL (ref 0.50–0.99)
GLUCOSE: 73 mg/dL (ref 65–99)
POTASSIUM: 3.9 mmol/L (ref 3.5–5.3)
SODIUM: 141 mmol/L (ref 135–146)

## 2016-10-06 NOTE — Assessment & Plan Note (Signed)
BP is much better today since pt started taking meds as directed.  Asymptomatic at this time.  Pt is now exercising regularly and making lifestyle changes.  Applauded her efforts.  Check BMP due to changing to Valsartan.  No anticipated med changes.

## 2016-10-06 NOTE — Progress Notes (Signed)
Pre visit review using our clinic review tool, if applicable. No additional management support is needed unless otherwise documented below in the visit note. 

## 2016-10-06 NOTE — Progress Notes (Signed)
   Subjective:    Patient ID: Laura Patrick, female    DOB: Apr 11, 1956, 61 y.o.   MRN: [id number]  HPI HTN- chronic problem, on Metoprolol and Valsartan HCTZ.  Much better BP control today since taking meds.  Pt has lost 7 lbs since last visit.  Pt reports feeling better.  Doing treadmill twice weekly.  No CP, SOB, HAs, visual changes, edema.   Review of Systems For ROS see HPI     Objective:   Physical Exam  Constitutional: She is oriented to person, place, and time. She appears well-developed and well-nourished. No distress.  HENT:  Head: Normocephalic and atraumatic.  Eyes: Conjunctivae and EOM are normal. Pupils are equal, round, and reactive to light.  Neck: Normal range of motion. Neck supple. No thyromegaly present.  Cardiovascular: Normal rate, regular rhythm, normal heart sounds and intact distal pulses.   No murmur heard. Pulmonary/Chest: Effort normal and breath sounds normal. No respiratory distress.  Abdominal: Soft. She exhibits no distension. There is no tenderness.  Musculoskeletal: She exhibits no edema.  Lymphadenopathy:    She has no cervical adenopathy.  Neurological: She is alert and oriented to person, place, and time.  Skin: Skin is warm and dry.  Psychiatric: She has a normal mood and affect. Her behavior is normal.  Vitals reviewed.         Assessment & Plan:

## 2016-10-06 NOTE — Patient Instructions (Signed)
Schedule your complete physical in 3-4 months We'll notify you of your lab results and make any changes if needed Keep up the good work!  I'm proud of you!!! Call with any questions or concerns [place]!!!

## 2016-10-07 MED FILL — VALSARTAN-HCTZ 320-12.5 MG: 320-12.5 | 30 days supply | Qty: 30 | Fill #1

## 2016-10-07 MED FILL — METOPROLOL SUCC ER 50 MG TA: 50 | 30 days supply | Qty: 30 | Fill #1

## 2016-10-09 ENCOUNTER — Ambulatory Visit: Payer: Commercial Managed Care - PPO | Admitting: Family Medicine

## 2016-11-05 MED FILL — METOPROLOL SUCC ER 50 MG TA: 50 | 30 days supply | Qty: 30 | Fill #2

## 2016-11-05 MED FILL — VALSARTAN-HCTZ 320-12.5 MG: 320-12.5 | 30 days supply | Qty: 30 | Fill #2

## 2016-12-01 MED FILL — METOPROLOL SUCC ER 50 MG TA: 50 | 30 days supply | Qty: 30 | Fill #3

## 2016-12-01 MED FILL — VALSARTAN-HCTZ 320-12.5 MG: 320-12.5 | 30 days supply | Qty: 30 | Fill #3

## 2016-12-03 ENCOUNTER — Other Ambulatory Visit: Payer: Self-pay | Admitting: Physician Assistant

## 2016-12-04 ENCOUNTER — Other Ambulatory Visit: Payer: Self-pay | Admitting: Family Medicine

## 2016-12-04 DIAGNOSIS — Z1231 Encounter for screening mammogram for malignant neoplasm of breast: Secondary | ICD-10-CM

## 2016-12-08 ENCOUNTER — Other Ambulatory Visit: Payer: Self-pay | Admitting: Physician Assistant

## 2016-12-26 ENCOUNTER — Ambulatory Visit: Payer: Commercial Managed Care - PPO

## 2017-01-05 ENCOUNTER — Other Ambulatory Visit: Payer: Self-pay | Admitting: Family Medicine

## 2017-01-05 MED FILL — VALSARTAN-HCTZ 320-12.5 MG: 320-12.5 | 30 days supply | Qty: 30 | Fill #0

## 2017-01-05 MED FILL — METOPROLOL SUCC ER 50 MG TA: 50 | 30 days supply | Qty: 30 | Fill #0

## 2017-01-06 ENCOUNTER — Ambulatory Visit: Payer: Commercial Managed Care - PPO

## 2017-01-26 ENCOUNTER — Ambulatory Visit
Admission: RE | Admit: 2017-01-26 | Discharge: 2017-01-26 | Disposition: A | Discharge status: Home / Self Care | Payer: Commercial Managed Care - PPO | Source: Ambulatory Visit | Attending: Family Medicine | Admitting: Family Medicine

## 2017-01-26 ENCOUNTER — Ambulatory Visit: Payer: Commercial Managed Care - PPO

## 2017-01-26 DIAGNOSIS — Z1231 Encounter for screening mammogram for malignant neoplasm of breast: Secondary | ICD-10-CM

## 2017-02-02 MED FILL — VALSARTAN-HCTZ 320-12.5 MG: 320-12.5 | 30 days supply | Qty: 30 | Fill #1

## 2017-02-02 MED FILL — METOPROLOL SUCC ER 50 MG TA: 50 | 30 days supply | Qty: 30 | Fill #1

## 2017-03-02 MED FILL — VALSARTAN-HCTZ 320-12.5 MG: 320-12.5 | 30 days supply | Qty: 30 | Fill #2

## 2017-03-02 MED FILL — METOPROLOL SUCC ER 50 MG TA: 50 | 30 days supply | Qty: 30 | Fill #2

## 2017-03-30 MED FILL — VALSARTAN-HCTZ 320-12.5 MG: 320-12.5 | 30 days supply | Qty: 30 | Fill #3

## 2017-03-30 MED FILL — METOPROLOL SUCC ER 50 MG TA: 50 | 30 days supply | Qty: 30 | Fill #3

## 2017-04-23 ENCOUNTER — Encounter: Payer: Self-pay | Admitting: Internal Medicine

## 2017-04-28 ENCOUNTER — Other Ambulatory Visit: Payer: Self-pay | Admitting: Family Medicine

## 2017-04-28 MED FILL — VALSARTAN-HCTZ 320-12.5 MG: 320-12.5 | 30 days supply | Qty: 30 | Fill #0

## 2017-04-28 MED FILL — METOPROLOL SUCC ER 50 MG TA: 50 | 30 days supply | Qty: 30 | Fill #0

## 2017-05-18 ENCOUNTER — Encounter: Payer: Commercial Managed Care - PPO | Admitting: Family Medicine

## 2017-05-28 ENCOUNTER — Other Ambulatory Visit: Payer: Self-pay | Admitting: Family Medicine

## 2017-05-28 MED FILL — VALSARTAN-HCTZ 320-12.5 MG: 320-12.5 | 30 days supply | Qty: 30 | Fill #1

## 2017-05-28 MED FILL — METOPROLOL SUCC ER 50 MG TA: 50 | 90 days supply | Qty: 90 | Fill #0

## 2017-05-29 ENCOUNTER — Ambulatory Visit (INDEPENDENT_AMBULATORY_CARE_PROVIDER_SITE_OTHER): Payer: PRIVATE HEALTH INSURANCE | Admitting: Physician Assistant

## 2017-05-29 ENCOUNTER — Encounter: Payer: Self-pay | Admitting: Physician Assistant

## 2017-05-29 VITALS — BP 160/90 | HR 60 | Temp 98.7°F | Resp 14 | Ht 60.0 in | Wt 192.0 lb

## 2017-05-29 DIAGNOSIS — Z8639 Personal history of other endocrine, nutritional and metabolic disease: Secondary | ICD-10-CM | POA: Insufficient documentation

## 2017-05-29 DIAGNOSIS — I10 Essential (primary) hypertension: Secondary | ICD-10-CM

## 2017-05-29 DIAGNOSIS — E669 Obesity, unspecified: Secondary | ICD-10-CM | POA: Diagnosis not present

## 2017-05-29 DIAGNOSIS — Z0000 Encounter for general adult medical examination without abnormal findings: Secondary | ICD-10-CM | POA: Diagnosis not present

## 2017-05-29 LAB — CBC WITH DIFFERENTIAL/PLATELET
BASOS ABS: 0 10*3/uL (ref 0.0–0.1)
Basophils Relative: 0.6 % (ref 0.0–3.0)
EOS ABS: 0.1 10*3/uL (ref 0.0–0.7)
Eosinophils Relative: 1.9 % (ref 0.0–5.0)
HEMATOCRIT: 42.4 % (ref 36.0–46.0)
HEMOGLOBIN: 13.9 g/dL (ref 12.0–15.0)
LYMPHS PCT: 45.9 % (ref 12.0–46.0)
Lymphs Abs: 1.8 10*3/uL (ref 0.7–4.0)
MCHC: 32.9 g/dL (ref 30.0–36.0)
MCV: 86.9 fl (ref 78.0–100.0)
MONO ABS: 0.3 10*3/uL (ref 0.1–1.0)
Monocytes Relative: 8.4 % (ref 3.0–12.0)
Neutro Abs: 1.7 10*3/uL (ref 1.4–7.7)
Neutrophils Relative %: 43.2 % (ref 43.0–77.0)
Platelets: 272 10*3/uL (ref 150.0–400.0)
RBC: 4.88 Mil/uL (ref 3.87–5.11)
RDW: 13.9 % (ref 11.5–15.5)
WBC: 3.9 10*3/uL — AB (ref 4.0–10.5)

## 2017-05-29 LAB — COMPREHENSIVE METABOLIC PANEL
ALBUMIN: 4.4 g/dL (ref 3.5–5.2)
ALK PHOS: 64 U/L (ref 39–117)
ALT: 12 U/L (ref 0–35)
AST: 12 U/L (ref 0–37)
BUN: 13 mg/dL (ref 6–23)
CALCIUM: 9.5 mg/dL (ref 8.4–10.5)
CHLORIDE: 105 meq/L (ref 96–112)
CO2: 30 mEq/L (ref 19–32)
CREATININE: 0.79 mg/dL (ref 0.40–1.20)
GFR: 94.37 mL/min (ref >60.00)
Glucose, Bld: 97 mg/dL (ref 70–99)
Potassium: 3.8 mEq/L (ref 3.5–5.1)
SODIUM: 141 meq/L (ref 135–145)
TOTAL PROTEIN: 7.6 g/dL (ref 6.0–8.3)
Total Bilirubin: 0.5 mg/dL (ref 0.2–1.2)

## 2017-05-29 LAB — LIPID PANEL
CHOL/HDL RATIO: 3
CHOLESTEROL: 158 mg/dL (ref 0–200)
HDL: 47.7 mg/dL (ref >39.00)
LDL CALC: 81 mg/dL (ref 0–99)
NonHDL: 109.97
TRIGLYCERIDES: 147 mg/dL (ref 0.0–149.0)
VLDL: 29.4 mg/dL (ref 0.0–40.0)

## 2017-05-29 LAB — TSH: TSH: 2.33 u[IU]/mL (ref 0.35–4.50)

## 2017-05-29 LAB — HEMOGLOBIN A1C: Hgb A1c MFr Bld: 5.6 % (ref 4.6–6.5)

## 2017-05-29 LAB — VITAMIN D 25 HYDROXY (VIT D DEFICIENCY, FRACTURES): VITD: 12.38 ng/mL — ABNORMAL LOW (ref 30.00–100.00)

## 2017-05-29 NOTE — Assessment & Plan Note (Signed)
Depression screen negative. Health Maintenance reviewed. Parameters up-to-date. Preventive schedule discussed and handout given in AVS. Will obtain fasting labs today.

## 2017-05-29 NOTE — Patient Instructions (Signed)
Please go to the lab for blood work.   Our office will call you with your results unless you have chosen to receive results via MyChart.  If your blood work is normal we will follow-up each year for physicals and as scheduled for chronic medical problems.  If anything is abnormal we will treat accordingly and get you in for a follow-up.  Please check BP at home, after having rested for 10 minutes. Write these down. Follow-up in 2 weeks for recheck of BP after taking medication. Bring your BP numbers to that appointment.   Preventive Care 40-64 Years, Female Preventive care refers to lifestyle choices and visits with your health care provider that can promote health and wellness. What does preventive care include?  A yearly physical exam. This is also called an annual well check.  Dental exams once or twice a year.  Routine eye exams. Ask your health care provider how often you should have your eyes checked.  Personal lifestyle choices, including: ? Daily care of your teeth and gums. ? Regular physical activity. ? Eating a healthy diet. ? Avoiding tobacco and drug use. ? Limiting alcohol use. ? Practicing safe sex. ? Taking low-dose aspirin daily starting at age 55. ? Taking vitamin and mineral supplements as recommended by your health care provider. What happens during an annual well check? The services and screenings done by your health care provider during your annual well check will depend on your age, overall health, lifestyle risk factors, and family history of disease. Counseling Your health care provider may ask you questions about your:  Alcohol use.  Tobacco use.  Drug use.  Emotional well-being.  Home and relationship well-being.  Sexual activity.  Eating habits.  Work and work Statistician.  Method of birth control.  Menstrual cycle.  Pregnancy history.  Screening You may have the following tests or measurements:  Height, weight, and BMI.  Blood  pressure.  Lipid and cholesterol levels. These may be checked every 5 years, or more frequently if you are over 44 years old.  Skin check.  Lung cancer screening. You may have this screening every year starting at age 70 if you have a 30-pack-year history of smoking and currently smoke or have quit within the past 15 years.  Fecal occult blood test (FOBT) of the stool. You may have this test every year starting at age 11.  Flexible sigmoidoscopy or colonoscopy. You may have a sigmoidoscopy every 5 years or a colonoscopy every 10 years starting at age 74.  Hepatitis C blood test.  Hepatitis B blood test.  Sexually transmitted disease (STD) testing.  Diabetes screening. This is done by checking your blood sugar (glucose) after you have not eaten for a while (fasting). You may have this done every 1-3 years.  Mammogram. This may be done every 1-2 years. Talk to your health care provider about when you should start having regular mammograms. This may depend on whether you have a family history of breast cancer.  BRCA-related cancer screening. This may be done if you have a family history of breast, ovarian, tubal, or peritoneal cancers.  Pelvic exam and Pap test. This may be done every 3 years starting at age 68. Starting at age 60, this may be done every 5 years if you have a Pap test in combination with an HPV test.  Bone density scan. This is done to screen for osteoporosis. You may have this scan if you are at high risk for osteoporosis.  Discuss  your test results, treatment options, and if necessary, the need for more tests with your health care provider. Vaccines Your health care provider may recommend certain vaccines, such as:  Influenza vaccine. This is recommended every year.  Tetanus, diphtheria, and acellular pertussis (Tdap, Td) vaccine. You may need a Td booster every 10 years.  Varicella vaccine. You may need this if you have not been vaccinated.  Zoster vaccine. You  may need this after age 40.  Measles, mumps, and rubella (MMR) vaccine. You may need at least one dose of MMR if you were born in 1957 or later. You may also need a second dose.  Pneumococcal 13-valent conjugate (PCV13) vaccine. You may need this if you have certain conditions and were not previously vaccinated.  Pneumococcal polysaccharide (PPSV23) vaccine. You may need one or two doses if you smoke cigarettes or if you have certain conditions.  Meningococcal vaccine. You may need this if you have certain conditions.  Hepatitis A vaccine. You may need this if you have certain conditions or if you travel or work in places where you may be exposed to hepatitis A.  Hepatitis B vaccine. You may need this if you have certain conditions or if you travel or work in places where you may be exposed to hepatitis B.  Haemophilus influenzae type b (Hib) vaccine. You may need this if you have certain conditions.  Talk to your health care provider about which screenings and vaccines you need and how often you need them. This information is not intended to replace advice given to you by your health care provider. Make sure you discuss any questions you have with your health care provider. Document Released: 08/17/2015 Document Revised: 04/09/2016 Document Reviewed: 05/22/2015 Elsevier Interactive Patient Education  2017 [employer].

## 2017-05-29 NOTE — Assessment & Plan Note (Signed)
Repeat levels today

## 2017-05-29 NOTE — Progress Notes (Signed)
Pre visit review using our clinic review tool, if applicable. No additional management support is needed unless otherwise documented below in the visit note. 

## 2017-05-29 NOTE — Progress Notes (Signed)
Patient presents to clinic today for annual exam.  Patient is fasting for labs.  Acute Concerns: Denies acute concerns today.  Chronic Issues: Hypertension -- Patient is currently on a regimen of Toprol XL 50 mg, Diovan-HCT 320-12.5. Is taking as directed but has not taken this morning. Patient denies chest pain, palpitations, lightheadedness, dizziness, vision changes or frequent headaches. Is trying to keep a well-balanced diet and stay active.   Health Maintenance: Immunizations -- Flu andTetanus up-to-date.  Colonoscopy -- Last 09/11/2016. Repeat scheduled in November. Mammogram -- up-to-date PAP -- up-to-date  Past Medical History:  Diagnosis Date  . Hypertension   . Ovarian cyst     Past Surgical History:  Procedure Laterality Date  . OOPHORECTOMY Right 1988  . OVARIAN CYST REMOVAL      Current Outpatient Prescriptions on File Prior to Visit  Medication Sig Dispense Refill  . aspirin EC 81 MG tablet Take 81 mg by mouth daily.    . metoprolol succinate (TOPROL-XL) 50 MG 24 hr tablet Take 1 tablet (50 mg total) by mouth daily. 90 tablet 0  . valsartan-hydrochlorothiazide (DIOVAN-HCT) 320-12.5 MG tablet Take 1 tablet by mouth daily. No further refills without a Blood pressure follow up. Call [phone] to schedule. 30 tablet 3   No current facility-administered medications on file prior to visit.     No Known Allergies  Family History  Problem Relation Age of Onset  . Heart disease Mother        CHF  . Hypertension Father   . Diabetes Father     Social History   Social History  . Marital status: Married    Spouse name: N/A  . Number of children: N/A  . Years of education: N/A   Occupational History  . Not on file.   Social History Main Topics  . Smoking status: Never Smoker  . Smokeless tobacco: Never Used  . Alcohol use No  . Drug use: No  . Sexual activity: Not on file   Other Topics Concern  . Not on file   Social History Narrative  . No  narrative on file   Review of Systems  Constitutional: Negative for fever and weight loss.  HENT: Negative for ear discharge, ear pain, hearing loss and tinnitus.   Eyes: Negative for blurred vision, double vision, photophobia and pain.  Respiratory: Negative for cough and shortness of breath.   Cardiovascular: Negative for chest pain and palpitations.       Occasional swelling in ankles.   Gastrointestinal: Negative for abdominal pain, blood in stool, constipation, diarrhea, heartburn, melena, nausea and vomiting.  Genitourinary: Negative for dysuria, flank pain, frequency, hematuria and urgency.  Musculoskeletal: Negative for falls.  Neurological: Negative for dizziness, loss of consciousness and headaches.  Endo/Heme/Allergies: Negative for environmental allergies.  Psychiatric/Behavioral: Negative for depression, hallucinations, substance abuse and suicidal ideas. The patient is not nervous/anxious and does not have insomnia.    BP (!) 160/90   Pulse 60   Temp 98.7 F (37.1 C) (Oral)   Resp 14   Ht 5' (1.524 m)   Wt 192 lb (87.1 kg)   SpO2 98%   BMI 37.50 kg/m   Physical Exam  Constitutional: She is oriented to person, place, and time and well-developed, well-nourished, and in no distress.  HENT:  Head: Normocephalic and atraumatic.  Right Ear: Tympanic membrane, external ear and ear canal normal.  Left Ear: Tympanic membrane, external ear and ear canal normal.  Nose: Nose normal. No mucosal  edema.  Mouth/Throat: Uvula is midline, oropharynx is clear and moist and mucous membranes are normal. No oropharyngeal exudate or posterior oropharyngeal erythema.  Eyes: Pupils are equal, round, and reactive to light. Conjunctivae are normal.  Neck: Neck supple. No thyromegaly present.  Cardiovascular: Normal rate, regular rhythm, normal heart sounds and intact distal pulses.   Pulmonary/Chest: Effort normal and breath sounds normal. No respiratory distress. She has no wheezes. She has  no rales.  Abdominal: Soft. Bowel sounds are normal. She exhibits no distension and no mass. There is no tenderness. There is no rebound and no guarding.  Lymphadenopathy:    She has no cervical adenopathy.  Neurological: She is alert and oriented to person, place, and time. No cranial nerve deficit.  Skin: Skin is warm and dry. No rash noted.  Psychiatric: Affect normal.  Vitals reviewed.  Assessment/Plan: Essential hypertension Has not taken meds today. Asymptomatic. Is checking at home with elevated readings but is checking as soon as she walks in the door from work. Discussed appropriate measures to check BP at home. Follow-up 2 weeks (having taken medications) for repeat assessment.   Visit for preventive health examination Depression screen negative. Health Maintenance reviewed. Parameters up-to-date. Preventive schedule discussed and handout given in AVS. Will obtain fasting labs today.   Obesity (BMI 30-39.9) Body mass index is 37.5 kg/m. Dietary and exercise recommendations reviewed.  History of vitamin D deficiency Repeat levels today.    Leeanne Rio, PA-C

## 2017-05-29 NOTE — Assessment & Plan Note (Signed)
Has not taken meds today. Asymptomatic. Is checking at home with elevated readings but is checking as soon as she walks in the door from work. Discussed appropriate measures to check BP at home. Follow-up 2 weeks (having taken medications) for repeat assessment.

## 2017-05-29 NOTE — Assessment & Plan Note (Signed)
Body mass index is 37.5 kg/m. Dietary and exercise recommendations reviewed.

## 2017-06-03 ENCOUNTER — Other Ambulatory Visit: Payer: Self-pay | Admitting: Physician Assistant

## 2017-06-03 DIAGNOSIS — E559 Vitamin D deficiency, unspecified: Secondary | ICD-10-CM

## 2017-06-03 MED ORDER — VITAMIN D-3 25 MCG (1000 UT) PO CAPS: 1.0000 | ORAL_CAPSULE | Freq: Every day | ORAL | 0 refills | Status: DC

## 2017-06-03 MED ORDER — VITAMIN D (ERGOCALCIFEROL) 1.25 MG (50000 UNIT) PO CAPS: ORAL_CAPSULE | ORAL | 0 refills | Status: DC

## 2017-06-03 MED FILL — VIT D2 1.25 MG (50,000 UNIT: 1.25 MG | 84 days supply | Qty: 12 | Fill #0

## 2017-06-18 ENCOUNTER — Other Ambulatory Visit: Payer: Self-pay

## 2017-06-18 ENCOUNTER — Ambulatory Visit (AMBULATORY_SURGERY_CENTER): Payer: Self-pay | Admitting: *Deleted

## 2017-06-18 VITALS — Ht 60.0 in | Wt 190.0 lb

## 2017-06-18 DIAGNOSIS — Z1211 Encounter for screening for malignant neoplasm of colon: Secondary | ICD-10-CM

## 2017-06-18 MED ORDER — NA SULFATE-K SULFATE-MG SULF 17.5-3.13-1.6 GM/177ML PO SOLN: 1.0000 | Freq: Once | ORAL | 0 refills | Status: AC

## 2017-06-18 MED FILL — SUPREP BOWEL PREP KIT: 17.5-3.13-1 | 2 days supply | Qty: 354 | Fill #0

## 2017-06-18 NOTE — Progress Notes (Signed)
No egg or soy allergy known to patient  No issues with past sedation with any surgeries  or procedures, no intubation problems  No diet pills per patient No home 02 use per patient  No blood thinners per patient  Pt denies issues with constipation  No A fib or A flutter  EMMI video sent to pt's e mail  

## 2017-07-01 ENCOUNTER — Other Ambulatory Visit: Payer: Self-pay

## 2017-07-01 ENCOUNTER — Encounter (HOSPITAL_COMMUNITY): Payer: Self-pay | Admitting: *Deleted

## 2017-07-01 ENCOUNTER — Telehealth: Payer: Self-pay | Admitting: Internal Medicine

## 2017-07-01 ENCOUNTER — Emergency Department (HOSPITAL_COMMUNITY)
Admission: EM | Admit: 2017-07-01 | Discharge: 2017-07-01 | Disposition: A | Discharge status: Home / Self Care | Payer: PRIVATE HEALTH INSURANCE | Attending: Emergency Medicine | Admitting: Emergency Medicine

## 2017-07-01 DIAGNOSIS — Z79899 Other long term (current) drug therapy: Secondary | ICD-10-CM | POA: Insufficient documentation

## 2017-07-01 DIAGNOSIS — R42 Dizziness and giddiness: Secondary | ICD-10-CM | POA: Diagnosis not present

## 2017-07-01 DIAGNOSIS — E876 Hypokalemia: Secondary | ICD-10-CM | POA: Insufficient documentation

## 2017-07-01 DIAGNOSIS — I10 Essential (primary) hypertension: Secondary | ICD-10-CM | POA: Insufficient documentation

## 2017-07-01 DIAGNOSIS — T502X5A Adverse effect of carbonic-anhydrase inhibitors, benzothiadiazides and other diuretics, initial encounter: Secondary | ICD-10-CM

## 2017-07-01 LAB — BASIC METABOLIC PANEL
ANION GAP: 8 (ref 5–15)
BUN: 14 mg/dL (ref 6–20)
CALCIUM: 9.7 mg/dL (ref 8.9–10.3)
CO2: 26 mmol/L (ref 22–32)
CREATININE: 0.92 mg/dL (ref 0.44–1.00)
Chloride: 101 mmol/L (ref 101–111)
GFR calc Af Amer: >60 mL/min (ref >60)
GLUCOSE: 125 mg/dL — AB (ref 65–99)
Potassium: 2.9 mmol/L — ABNORMAL LOW (ref 3.5–5.1)
Sodium: 135 mmol/L (ref 135–145)

## 2017-07-01 LAB — CBC WITH DIFFERENTIAL/PLATELET
BASOS ABS: 0 10*3/uL (ref 0.0–0.1)
BASOS PCT: 0 %
EOS ABS: 0.1 10*3/uL (ref 0.0–0.7)
EOS PCT: 2 %
HCT: 41.5 % (ref 36.0–46.0)
Hemoglobin: 13.5 g/dL (ref 12.0–15.0)
Lymphocytes Relative: 51 %
Lymphs Abs: 2.5 10*3/uL (ref 0.7–4.0)
MCH: 28 pg (ref 26.0–34.0)
MCHC: 32.5 g/dL (ref 30.0–36.0)
MCV: 85.9 fL (ref 78.0–100.0)
MONO ABS: 0.4 10*3/uL (ref 0.1–1.0)
Monocytes Relative: 8 %
Neutro Abs: 1.9 10*3/uL (ref 1.7–7.7)
Neutrophils Relative %: 39 %
PLATELETS: 261 10*3/uL (ref 150–400)
RBC: 4.83 MIL/uL (ref 3.87–5.11)
RDW: 13.7 % (ref 11.5–15.5)
WBC: 4.9 10*3/uL (ref 4.0–10.5)

## 2017-07-01 LAB — MAGNESIUM: Magnesium: 1.9 mg/dL (ref 1.7–2.4)

## 2017-07-01 MED ORDER — POTASSIUM CHLORIDE CRYS ER 20 MEQ PO TBCR: EXTENDED_RELEASE_TABLET | ORAL | 0 refills | Status: DC

## 2017-07-01 MED ORDER — SODIUM CHLORIDE 0.9 % IV SOLN
Freq: Once | INTRAVENOUS | Status: AC
  Administered 2017-07-01: 05:00:00 via INTRAVENOUS

## 2017-07-01 MED ORDER — POTASSIUM CHLORIDE CRYS ER 20 MEQ PO TBCR
40.0000 meq | EXTENDED_RELEASE_TABLET | Freq: Once | ORAL | Status: AC
  Administered 2017-07-01: 40 meq via ORAL
  Filled 2017-07-01: qty 2

## 2017-07-01 MED ORDER — ONDANSETRON 8 MG PO TBDP
8.0000 mg | ORAL_TABLET | Freq: Once | ORAL | Status: AC
  Administered 2017-07-01: 8 mg via ORAL
  Filled 2017-07-01: qty 1

## 2017-07-01 MED ORDER — POTASSIUM CHLORIDE 10 MEQ/100ML IV SOLN
10.0000 meq | INTRAVENOUS | Status: AC
  Administered 2017-07-01 (×2): 10 meq via INTRAVENOUS
  Filled 2017-07-01 (×2): qty 100

## 2017-07-01 MED FILL — POTASSIUM CL ER 20 MEQ TAB: 20 | 30 days supply | Qty: 35 | Fill #0

## 2017-07-01 NOTE — ED Triage Notes (Signed)
Pt c/o feeling dizzy and nauseous when she woke up at 0230 to go to bathroom; pt states she is having a colonoscopy this Thursday and has not eaten what she usually does; pt states her BP was elevated at home pta

## 2017-07-01 NOTE — Telephone Encounter (Signed)
Noted  

## 2017-07-01 NOTE — ED Provider Notes (Signed)
[facility] EMERGENCY DEPARTMENT Provider Note   CSN: [id number] Arrival date & time: 07/01/17  0259     History   Chief Complaint Chief Complaint  Patient presents with  . Dizziness    HPI Laura Patrick is a 61 y.o. female.  The history is provided by the patient.  Dizziness  She woke up in about 2:30 AM to go to the bathroom and noted that she was having some mild lightheadedness.  There is also some associated nausea.  She denies vertigo and she denies near syncope.  Lightheadedness has resolved but she continues to have nausea.  She denies chest pain, heaviness, tightness, pressure.  Symptoms are not affected by body position.  She denies weakness, numbness, tingling.  She does note that she is preparing for a colonoscopy tomorrow.  She did check her blood pressure at home and noted that it was significantly elevated.  She does not usually check her blood pressure at home.  She has been compliant with her antihypertensive medications.  Past Medical History:  Diagnosis Date  . Anemia   . Hypertension   . Ovarian cyst     Patient Active Problem List   Diagnosis Date Noted  . Visit for preventive health examination 05/29/2017  . History of vitamin D deficiency 05/29/2017  . Physical exam 08/10/2014  . Encounter for Papanicolaou smear of cervix 08/10/2014  . Obesity (BMI 30-39.9) 10/14/2013  . Pain in limb 05/23/2010  . PES PLANUS 05/23/2010  . PARESTHESIA 05/23/2010  . HYPOPOTASSEMIA 08/30/2008  . HYPERLIPIDEMIA NEC/NOS 04/12/2007  . SYNDROME, CARPAL TUNNEL 04/12/2007  . Essential hypertension 04/12/2007    Past Surgical History:  Procedure Laterality Date  . COLONOSCOPY    . OOPHORECTOMY Right 1988  . OVARIAN CYST REMOVAL      OB History    No data available       Home Medications    Prior to Admission medications   Medication Sig Start Date End Date Taking? Authorizing Provider  aspirin EC 81 MG tablet Take 81 mg by mouth daily.    [provider]  Cholecalciferol (VITAMIN D-3) 1000 units CAPS Take 1 capsule (1,000 Units total) by mouth daily. 06/03/17   Brunetta Jeans, PA-C  Chromium 1000 MCG TABS Take daily by mouth.    [provider]  co-enzyme Q-10 30 MG capsule Take 30 mg daily by mouth.    [provider]  metoprolol succinate (TOPROL-XL) 50 MG 24 hr tablet Take 1 tablet (50 mg total) by mouth daily. 05/28/17   Midge Minium, MD  valsartan-hydrochlorothiazide (DIOVAN-HCT) 320-12.5 MG tablet Take 1 tablet by mouth daily. No further refills without a Blood pressure follow up. Call [phone] to schedule. 04/28/17   Midge Minium, MD  Vitamin D, Ergocalciferol, (DRISDOL) 50000 units CAPS capsule Take 1 capsule once a week for 12 weeks 06/03/17   Brunetta Jeans, PA-C    Family History Family History  Problem Relation Age of Onset  . Heart disease Mother        CHF  . Diabetes Mother   . Hypertension Father   . Diabetes Father   . Stroke Father   . Breast cancer Maternal Grandmother   . Hypertension Maternal Grandmother   . Stroke Maternal Grandmother   . Stroke Maternal Grandfather   . Hypertension Maternal Grandfather   . Breast cancer Paternal Grandmother   . Stroke Paternal Grandfather   . Hypertension Paternal Grandfather   . Colon cancer Neg  Hx   . Colon polyps Neg Hx   . Esophageal cancer Neg Hx   . Rectal cancer Neg Hx   . Stomach cancer Neg Hx     Social History Social History   Tobacco Use  . Smoking status: Never Smoker  . Smokeless tobacco: Never Used  Substance Use Topics  . Alcohol use: No  . Drug use: No     Allergies   Bee venom   Review of Systems Review of Systems  Neurological: Positive for dizziness.  All other systems reviewed and are negative.    Physical Exam Updated Vital Signs BP (!) 241/115 (BP Location: Right Arm)   Pulse (!) 104   Temp 98.2 F (36.8 C) (Oral)   Resp 20   Ht 5' (1.524 m)   Wt 84.8 kg (187 lb)   SpO2 97%    BMI 36.52 kg/m   Physical Exam  Nursing note and vitals reviewed.  61 year old female, resting comfortably and in no acute distress. Vital signs are significant for hypertension and mild tachycardia. Oxygen saturation is 97%, which is normal. Head is normocephalic and atraumatic. PERRLA, EOMI. Oropharynx is clear.  Fundi show no hemorrhage or exudate or papilledema. Neck is nontender and supple without adenopathy or JVD. Back is nontender and there is no CVA tenderness. Lungs are clear without rales, wheezes, or rhonchi. Chest is nontender. Heart has regular rate and rhythm with 0-9/3 systolic ejection murmur best heard at the upper left sternal border. Abdomen is soft, flat, nontender without masses or hepatosplenomegaly and peristalsis is normoactive. Extremities have 1-2+ edema, full range of motion is present. Skin is warm and dry without rash. Neurologic: Mental status is normal, cranial nerves are intact, there are no motor or sensory deficits.  There is no nystagmus.  Dizziness is not reproduced by vestibular stimulation.  ED Treatments / Results  Labs (all labs ordered are listed, but only abnormal results are displayed) Labs Reviewed  BASIC METABOLIC PANEL - Abnormal; Notable for the following components:      Result Value   Potassium 2.9 (*)    Glucose, Bld 125 (*)    All other components within normal limits  CBC WITH DIFFERENTIAL/PLATELET  MAGNESIUM    Procedures Procedures (including critical care time)  Medications Ordered in ED Medications  potassium chloride 10 mEq in 100 mL IVPB (10 mEq Intravenous New Bag/Given 07/01/17 0601)  ondansetron (ZOFRAN-ODT) disintegrating tablet 8 mg (8 mg Oral Given 07/01/17 0342)  potassium chloride SA (K-DUR,KLOR-CON) CR tablet 40 mEq (40 mEq Oral Given 07/01/17 0450)  0.9 %  sodium chloride infusion ( Intravenous New Bag/Given 07/01/17 0457)     Initial Impression / Assessment and Plan / ED Course  I have reviewed the  triage vital signs and the nursing notes.  Pertinent labs & imaging results that were available during my care of the patient were reviewed by me and considered in my medical decision making (see chart for details).  Lightheadedness of uncertain cause. No evidence of vertigo. Elevated blood pressure with no evidence of end-organ damage. Review of old records shows labile hypertension. Will check screening labs, give ondansetron for nausea.  Laboratory workup shows significant hypokalemia.  This is presumably secondary to thiazide diuretic use.  Magnesium is checked and is in the normal range.  She is given potassium supplementation both intravenously and orally, and she feels much better.  Blood pressure has come down.  She is discharged with prescription for potassium and advised to follow-up  with PCP in 1 week.  She likely will need chronic potassium supplementation as long as she is on a thiazide diuretic.  Final Clinical Impressions(s) / ED Diagnoses   Final diagnoses:  Dizziness  Diuretic-induced hypokalemia    ED Discharge Orders        Ordered    potassium chloride SA (K-DUR,KLOR-CON) 20 MEQ tablet     03/47/42 5956       Delora Fuel, MD 38/75/64 [phone]

## 2017-07-01 NOTE — Discharge Instructions (Signed)
You will have to work with your doctor to find the appropriate potassium dose for you.

## 2017-07-02 ENCOUNTER — Encounter: Payer: Commercial Managed Care - PPO | Admitting: Internal Medicine

## 2017-07-02 MED FILL — VALSARTAN-HCTZ 320-12.5 MG: 320-12.5 | 30 days supply | Qty: 30 | Fill #2

## 2017-07-10 ENCOUNTER — Encounter: Payer: Self-pay | Admitting: Physician Assistant

## 2017-07-10 ENCOUNTER — Other Ambulatory Visit: Payer: Self-pay

## 2017-07-10 ENCOUNTER — Ambulatory Visit: Payer: PRIVATE HEALTH INSURANCE | Admitting: Physician Assistant

## 2017-07-10 ENCOUNTER — Other Ambulatory Visit: Payer: Self-pay | Admitting: Family Medicine

## 2017-07-10 VITALS — BP 180/100 | HR 63 | Temp 98.7°F | Resp 14 | Ht 60.0 in | Wt 188.0 lb

## 2017-07-10 DIAGNOSIS — I10 Essential (primary) hypertension: Secondary | ICD-10-CM | POA: Diagnosis not present

## 2017-07-10 DIAGNOSIS — E876 Hypokalemia: Secondary | ICD-10-CM | POA: Diagnosis not present

## 2017-07-10 LAB — BASIC METABOLIC PANEL
BUN: 13 mg/dL (ref 6–23)
CALCIUM: 9.8 mg/dL (ref 8.4–10.5)
CO2: 31 mEq/L (ref 19–32)
CREATININE: 0.88 mg/dL (ref 0.40–1.20)
Chloride: 102 mEq/L (ref 96–112)
GFR: 83.29 mL/min (ref >60.00)
GLUCOSE: 80 mg/dL (ref 70–99)
Potassium: 4.1 mEq/L (ref 3.5–5.1)
SODIUM: 138 meq/L (ref 135–145)

## 2017-07-10 MED ORDER — AMLODIPINE BESYLATE 10 MG PO TABS: 10.0000 mg | ORAL_TABLET | Freq: Every day | ORAL | 1 refills | Status: DC

## 2017-07-10 MED FILL — AMLODIPINE BESYLATE 10 MG T: 10 | 30 days supply | Qty: 30 | Fill #0

## 2017-07-10 NOTE — Telephone Encounter (Signed)
Medication has been submitted

## 2017-07-10 NOTE — Telephone Encounter (Signed)
Copied from [employer] [id number]. Topic: Inquiry >> Jul 10, 2017  3:47 PM Cecelia Byars, NT wrote: Reason for CRM: Patient just left practice left prescription for norvasc at office would like it called in to [facility] please call [phone]

## 2017-07-10 NOTE — Progress Notes (Signed)
Patient presents to clinic today for ER follow-up. Patient presented to [facility] ER on 07/01/17 with complaint of dizziness noted early that morning when she woke up to go to the bathroom.  Endorsed nausea associated with her symptoms.  At time of ER assessment she was denying any dizziness, chest pain, shortness of breath, palpitations, weakness, numbness or tingling.  She had been undergoing bowel prep for a colonoscopy the next day.  ER assessment included assessment of blood pressure (241/115), Labs revealing hypokalemia but otherwise unremarkable. Patient was given IV fluids and potassium repletion. BP trended down during ER stay to 190 SBP. Was discharged home without change in hypertensive regimen. Started on potassium supplement and encouraged to follow-up at PCP in 1 week.  Since discharge, patient endorses feeling well. Is taking potassium supplement as directed. Denies headache, chest pain, SOB, lightheadedness, dizziness. Is not checking BP at home as directed by her PCP and ER MD. Has overhauled her diet -- cut out sodas, sweets, fried foods and processed foods. Is staying very well hydrated. Denies any leg swelling, PND or orthopnea. States she is feeling good but does not understand why BP is staying so elevated.  Past Medical History:  Diagnosis Date  . Anemia   . Hypertension   . Ovarian cyst     Current Outpatient Medications on File Prior to Visit  Medication Sig Dispense Refill  . aspirin EC 81 MG tablet Take 81 mg by mouth daily.    . Cholecalciferol (VITAMIN D-3) 1000 units CAPS Take 1 capsule (1,000 Units total) by mouth daily. 30 capsule 0  . Chromium 1000 MCG TABS Take daily by mouth.    . co-enzyme Q-10 30 MG capsule Take 30 mg daily by mouth.    . metoprolol succinate (TOPROL-XL) 50 MG 24 hr tablet Take 1 tablet (50 mg total) by mouth daily. 90 tablet 0  . potassium chloride SA (K-DUR,KLOR-CON) 20 MEQ tablet Take one tablet, twice a day for five days, then one  tablet once a day. 35 tablet 0  . valsartan-hydrochlorothiazide (DIOVAN-HCT) 320-12.5 MG tablet Take 1 tablet by mouth daily. No further refills without a Blood pressure follow up. Call [phone] to schedule. 30 tablet 3  . Vitamin D, Ergocalciferol, (DRISDOL) 50000 units CAPS capsule Take 1 capsule once a week for 12 weeks 12 capsule 0   No current facility-administered medications on file prior to visit.     Allergies  Allergen Reactions  . Bee Venom     Family History  Problem Relation Age of Onset  . Heart disease Mother        CHF  . Diabetes Mother   . Hypertension Father   . Diabetes Father   . Stroke Father   . Breast cancer Maternal Grandmother   . Hypertension Maternal Grandmother   . Stroke Maternal Grandmother   . Stroke Maternal Grandfather   . Hypertension Maternal Grandfather   . Breast cancer Paternal Grandmother   . Stroke Paternal Grandfather   . Hypertension Paternal Grandfather   . Colon cancer Neg Hx   . Colon polyps Neg Hx   . Esophageal cancer Neg Hx   . Rectal cancer Neg Hx   . Stomach cancer Neg Hx     Social History   Socioeconomic History  . Marital status: Married    Spouse name: None  . Number of children: None  . Years of education: None  . Highest education level: None  Social Needs  . Emergency planning/management officer  strain: None  . Food insecurity - worry: None  . Food insecurity - inability: None  . Transportation needs - medical: None  . Transportation needs - non-medical: None  Occupational History  . None  Tobacco Use  . Smoking status: Never Smoker  . Smokeless tobacco: Never Used  Substance and Sexual Activity  . Alcohol use: No  . Drug use: No  . Sexual activity: None  Other Topics Concern  . None  Social History Narrative  . None   Review of Systems - See HPI.  All other ROS are negative.  BP (!) 170/120   Pulse 63   Temp 98.7 F (37.1 C) (Oral)   Resp 14   Ht 5' (1.524 m)   Wt 188 lb (85.3 kg)   SpO2 99%   BMI 36.72  kg/m   Physical Exam  Constitutional: She is oriented to person, place, and time and well-developed, well-nourished, and in no distress.  HENT:  Head: Normocephalic and atraumatic.  Eyes: Conjunctivae are normal.  Neck: Neck supple.  Cardiovascular: Normal rate, regular rhythm, normal heart sounds and intact distal pulses.  Pulmonary/Chest: Effort normal and breath sounds normal. No respiratory distress. She has no wheezes. She has no rales. She exhibits no tenderness.  Neurological: She is alert and oriented to person, place, and time.  Vitals reviewed.   Recent Results (from the past 2160 hour(s))  Comprehensive metabolic panel     Status: None   Collection Time: 05/29/17  8:53 AM  Result Value Ref Range   Sodium 141 135 - 145 mEq/L   Potassium 3.8 3.5 - 5.1 mEq/L   Chloride 105 96 - 112 mEq/L   CO2 30 19 - 32 mEq/L   Glucose, Bld 97 70 - 99 mg/dL   BUN 13 6 - 23 mg/dL   Creatinine, Ser 0.79 0.40 - 1.20 mg/dL   Total Bilirubin 0.5 0.2 - 1.2 mg/dL   Alkaline Phosphatase 64 39 - 117 U/L   AST 12 0 - 37 U/L   ALT 12 0 - 35 U/L   Total Protein 7.6 6.0 - 8.3 g/dL   Albumin 4.4 3.5 - 5.2 g/dL   Calcium 9.5 8.4 - 10.5 mg/dL   GFR 94.37 >60.00 mL/min  CBC with Differential/Platelet     Status: Abnormal   Collection Time: 05/29/17  8:53 AM  Result Value Ref Range   WBC 3.9 (L) 4.0 - 10.5 K/uL   RBC 4.88 3.87 - 5.11 Mil/uL   Hemoglobin 13.9 12.0 - 15.0 g/dL   HCT 42.4 36.0 - 46.0 %   MCV 86.9 78.0 - 100.0 fl   MCHC 32.9 30.0 - 36.0 g/dL   RDW 13.9 11.5 - 15.5 %   Platelets 272.0 150.0 - 400.0 K/uL   Neutrophils Relative % 43.2 43.0 - 77.0 %   Lymphocytes Relative 45.9 12.0 - 46.0 %   Monocytes Relative 8.4 3.0 - 12.0 %   Eosinophils Relative 1.9 0.0 - 5.0 %   Basophils Relative 0.6 0.0 - 3.0 %   Neutro Abs 1.7 1.4 - 7.7 K/uL   Lymphs Abs 1.8 0.7 - 4.0 K/uL   Monocytes Absolute 0.3 0.1 - 1.0 K/uL   Eosinophils Absolute 0.1 0.0 - 0.7 K/uL   Basophils Absolute 0.0 0.0 - 0.1  K/uL  Lipid panel     Status: None   Collection Time: 05/29/17  8:53 AM  Result Value Ref Range   Cholesterol 158 0 - 200 mg/dL    Comment: ATP  III Classification       Desirable:  < 200 mg/dL               Borderline High:  200 - 239 mg/dL          High:  > = 240 mg/dL   Triglycerides 147.0 0.0 - 149.0 mg/dL    Comment: Normal:  <150 mg/dLBorderline High:  150 - 199 mg/dL   HDL 47.70 >39.00 mg/dL   VLDL 29.4 0.0 - 40.0 mg/dL   LDL Cholesterol 81 0 - 99 mg/dL   Total CHOL/HDL Ratio 3     Comment:                Men          Women1/2 Average Risk     3.4          3.3Average Risk          5.0          4.42X Average Risk          9.6          7.13X Average Risk          15.0          11.0                       NonHDL 109.97     Comment: NOTE:  Non-HDL goal should be 30 mg/dL higher than patient's LDL goal (i.e. LDL goal of < 70 mg/dL, would have non-HDL goal of < 100 mg/dL)  Hemoglobin A1c     Status: None   Collection Time: 05/29/17  8:53 AM  Result Value Ref Range   Hgb A1c MFr Bld 5.6 4.6 - 6.5 %    Comment: Glycemic Control Guidelines for People with Diabetes:Non Diabetic:  <6%Goal of Therapy: <7%Additional Action Suggested:  >8%   TSH     Status: None   Collection Time: 05/29/17  8:53 AM  Result Value Ref Range   TSH 2.33 0.35 - 4.50 uIU/mL  Vitamin D (25 hydroxy)     Status: Abnormal   Collection Time: 05/29/17  8:53 AM  Result Value Ref Range   VITD 12.38 (L) 30.00 - 100.00 ng/mL  Basic metabolic panel     Status: Abnormal   Collection Time: 07/01/17  3:36 AM  Result Value Ref Range   Sodium 135 135 - 145 mmol/L   Potassium 2.9 (L) 3.5 - 5.1 mmol/L   Chloride 101 101 - 111 mmol/L   CO2 26 22 - 32 mmol/L   Glucose, Bld 125 (H) 65 - 99 mg/dL   BUN 14 6 - 20 mg/dL   Creatinine, Ser 0.92 0.44 - 1.00 mg/dL   Calcium 9.7 8.9 - 10.3 mg/dL   GFR calc non Af Amer >60 >60 mL/min   GFR calc Af Amer >60 >60 mL/min    Comment: (NOTE) The eGFR has been calculated using the CKD EPI  equation. This calculation has not been validated in all clinical situations. eGFR's persistently <60 mL/min signify possible Chronic Kidney Disease.    Anion gap 8 5 - 15  CBC with Differential     Status: None   Collection Time: 07/01/17  3:36 AM  Result Value Ref Range   WBC 4.9 4.0 - 10.5 K/uL   RBC 4.83 3.87 - 5.11 MIL/uL   Hemoglobin 13.5 12.0 - 15.0 g/dL   HCT 41.5 36.0 - 46.0 %   MCV 85.9  78.0 - 100.0 fL   MCH 28.0 26.0 - 34.0 pg   MCHC 32.5 30.0 - 36.0 g/dL   RDW 13.7 11.5 - 15.5 %   Platelets 261 150 - 400 K/uL   Neutrophils Relative % 39 %   Neutro Abs 1.9 1.7 - 7.7 K/uL   Lymphocytes Relative 51 %   Lymphs Abs 2.5 0.7 - 4.0 K/uL   Monocytes Relative 8 %   Monocytes Absolute 0.4 0.1 - 1.0 K/uL   Eosinophils Relative 2 %   Eosinophils Absolute 0.1 0.0 - 0.7 K/uL   Basophils Relative 0 %   Basophils Absolute 0.0 0.0 - 0.1 K/uL  Magnesium     Status: None   Collection Time: 07/01/17  3:36 AM  Result Value Ref Range   Magnesium 1.9 1.7 - 2.4 mg/dL    Assessment/Plan: 1. Essential hypertension Significant elevation. Negative ER workup. EKG with sinus bradycardia. No change from prior tracings. Patient is completely asymptomatic and declines ER assessment. Will continue Diovan HCT. Stop Toprol XL and start 10 mg amlodipine daily. Will order renal artery ultrasound to assess for RAS giving treatment-resistant hypertension. Strict ER precautions given to patient. Follow-up scheduled for early next week. - EKG 12-Lead - US Renal Artery Stenosis; Future  2. Hypopotassemia Taking supplement as directed. Repeat BMP today. - EKG 34-ZGQH - Basic metabolic panel   Leeanne Rio, PA-C

## 2017-07-10 NOTE — Patient Instructions (Signed)
Please go to the lab today for blood work.  I will call you with your results. We will alter treatment regimen(s) if indicated by your results.   Since your EKG looks good and you are asymptomatic we can avoid repeat ER assessment. I am having you continue Valsartan-HCTZ at current dose along with your Potassium supplement. Stop the Metoprolol XL and start the amlodipine daily as directed.   I am setting you up for an ultrasound of renal arteries to further assess a potential cause of this labile BP and treatment-resistant hypertension.  Please follow-up with me [place] in 1 week for reassessment.  Any shortness of breath, chest pain, lightheadedness or dizziness, please call 911 or go to the ER.    DASH Eating Plan DASH stands for "Dietary Approaches to Stop Hypertension." The DASH eating plan is a healthy eating plan that has been shown to reduce high blood pressure (hypertension). It may also reduce your risk for type 2 diabetes, heart disease, and stroke. The DASH eating plan may also help with weight loss. What are tips for following this plan? General guidelines  Avoid eating more than 2,300 mg (milligrams) of salt (sodium) a day. If you have hypertension, you may need to reduce your sodium intake to 1,500 mg a day.  Limit alcohol intake to no more than 1 drink a day for nonpregnant women and 2 drinks a day for men. One drink equals 12 oz of beer, 5 oz of wine, or 1 oz of hard liquor.  Work with your health care provider to maintain a healthy body weight or to lose weight. Ask what an ideal weight is for you.  Get at least 30 minutes of exercise that causes your heart to beat faster (aerobic exercise) most days of the week. Activities may include walking, swimming, or biking.  Work with your health care provider or diet and nutrition specialist (dietitian) to adjust your eating plan to your individual calorie needs. Reading food labels  Check food labels for the amount of sodium per  serving. Choose foods with less than 5 percent of the Daily Value of sodium. Generally, foods with less than 300 mg of sodium per serving fit into this eating plan.  To find whole grains, look for the word "whole" as the first word in the ingredient list. Shopping  Buy products labeled as "low-sodium" or "no salt added."  Buy fresh foods. Avoid canned foods and premade or frozen meals. Cooking  Avoid adding salt when cooking. Use salt-free seasonings or herbs instead of table salt or sea salt. Check with your health care provider or pharmacist before using salt substitutes.  Do not fry foods. Cook foods using healthy methods such as baking, boiling, grilling, and broiling instead.  Cook with heart-healthy oils, such as olive, canola, soybean, or sunflower oil. Meal planning   Eat a balanced diet that includes: ? 5 or more servings of fruits and vegetables each day. At each meal, try to fill half of your plate with fruits and vegetables. ? Up to 6-8 servings of whole grains each day. ? Less than 6 oz of lean meat, poultry, or fish each day. A 3-oz serving of meat is about the same size as a deck of cards. One egg equals 1 oz. ? 2 servings of low-fat dairy each day. ? A serving of nuts, seeds, or beans 5 times each week. ? Heart-healthy fats. Healthy fats called Omega-3 fatty acids are found in foods such as flaxseeds and coldwater fish,  like sardines, salmon, and mackerel.  Limit how much you eat of the following: ? Canned or prepackaged foods. ? Food that is high in trans fat, such as fried foods. ? Food that is high in saturated fat, such as fatty meat. ? Sweets, desserts, sugary drinks, and other foods with added sugar. ? Full-fat dairy products.  Do not salt foods before eating.  Try to eat at least 2 vegetarian meals each week.  Eat more home-cooked food and less restaurant, buffet, and fast food.  When eating at a restaurant, ask that your food be prepared with less salt  or no salt, if possible. What foods are recommended? The items listed may not be a complete list. Talk with your dietitian about what dietary choices are best for you. Grains Whole-grain or whole-wheat bread. Whole-grain or whole-wheat pasta. Brown rice. [place]. Bulgur. Whole-grain and low-sodium cereals. Pita bread. Low-fat, low-sodium crackers. Whole-wheat flour tortillas. Vegetables Fresh or frozen vegetables (raw, steamed, roasted, or grilled). Low-sodium or reduced-sodium tomato and vegetable juice. Low-sodium or reduced-sodium tomato sauce and tomato paste. Low-sodium or reduced-sodium canned vegetables. Fruits All fresh, dried, or frozen fruit. Canned fruit in natural juice (without added sugar). Meat and other protein foods Skinless chicken or [place]. Ground chicken or [place]. Pork with fat trimmed off. Fish and seafood. Egg whites. Dried beans, peas, or lentils. Unsalted nuts, nut butters, and seeds. Unsalted canned beans. Lean cuts of beef with fat trimmed off. Low-sodium, lean deli meat. Dairy Low-fat (1%) or fat-free (skim) milk. Fat-free, low-fat, or reduced-fat cheeses. Nonfat, low-sodium ricotta or cottage cheese. Low-fat or nonfat yogurt. Low-fat, low-sodium cheese. Fats and oils Soft margarine without trans fats. Vegetable oil. Low-fat, reduced-fat, or light mayonnaise and salad dressings (reduced-sodium). Canola, safflower, olive, soybean, and sunflower oils. Avocado. Seasoning and other foods Herbs. Spices. Seasoning mixes without salt. Unsalted popcorn and pretzels. Fat-free sweets. What foods are not recommended? The items listed may not be a complete list. Talk with your dietitian about what dietary choices are best for you. Grains Baked goods made with fat, such as croissants, muffins, or some breads. Dry pasta or rice meal packs. Vegetables Creamed or fried vegetables. Vegetables in a cheese sauce. Regular canned vegetables (not low-sodium or reduced-sodium).  Regular canned tomato sauce and paste (not low-sodium or reduced-sodium). Regular tomato and vegetable juice (not low-sodium or reduced-sodium). Angie Fava. Olives. Fruits Canned fruit in a light or heavy syrup. Fried fruit. Fruit in cream or butter sauce. Meat and other protein foods Fatty cuts of meat. Ribs. Fried meat. Berniece Salines. Sausage. Bologna and other processed lunch meats. Salami. Fatback. Hotdogs. Bratwurst. Salted nuts and seeds. Canned beans with added salt. Canned or smoked fish. Whole eggs or egg yolks. Chicken or [place] with skin. Dairy Whole or 2% milk, cream, and half-and-half. Whole or full-fat cream cheese. Whole-fat or sweetened yogurt. Full-fat cheese. Nondairy creamers. Whipped toppings. Processed cheese and cheese spreads. Fats and oils Butter. Stick margarine. Lard. Shortening. Ghee. Bacon fat. Tropical oils, such as coconut, palm kernel, or palm oil. Seasoning and other foods Salted popcorn and pretzels. Onion salt, garlic salt, seasoned salt, table salt, and sea salt. Worcestershire sauce. Tartar sauce. Barbecue sauce. Teriyaki sauce. Soy sauce, including reduced-sodium. Steak sauce. Canned and packaged gravies. Fish sauce. Oyster sauce. Cocktail sauce. Horseradish that you find on the shelf. Ketchup. Mustard. Meat flavorings and tenderizers. Bouillon cubes. Hot sauce and Tabasco sauce. Premade or packaged marinades. Premade or packaged taco seasonings. Relishes. Regular salad dressings. Where to find more information:  National Heart, Lung, and Blood Institute: https://wilson-eaton.com/  American Heart Association: www.heart.org Summary  The DASH eating plan is a healthy eating plan that has been shown to reduce high blood pressure (hypertension). It may also reduce your risk for type 2 diabetes, heart disease, and stroke.  With the DASH eating plan, you should limit salt (sodium) intake to 2,300 mg a day. If you have hypertension, you may need to reduce your sodium intake to 1,500 mg  a day.  When on the DASH eating plan, aim to eat more fresh fruits and vegetables, whole grains, lean proteins, low-fat dairy, and heart-healthy fats.  Work with your health care provider or diet and nutrition specialist (dietitian) to adjust your eating plan to your individual calorie needs. This information is not intended to replace advice given to you by your health care provider. Make sure you discuss any questions you have with your health care provider. Document Released: 07/10/2011 Document Revised: 07/14/2016 Document Reviewed: 07/14/2016 Elsevier Interactive Patient Education  2017 [employer].

## 2017-07-10 NOTE — Addendum Note (Signed)
Addended by: Leonidas Romberg on: 07/10/2017 04:28 PM   Modules accepted: Orders

## 2017-07-16 ENCOUNTER — Telehealth: Payer: Self-pay | Admitting: Physician Assistant

## 2017-07-16 DIAGNOSIS — I10 Essential (primary) hypertension: Secondary | ICD-10-CM

## 2017-07-16 NOTE — Telephone Encounter (Signed)
Called [facility] to clarify about returned order for U/S Renal Artery Duplex, noted that they were not able to schedule. Informed that a general U/S Renal order must also be included for scheduling concurrently.

## 2017-07-17 ENCOUNTER — Encounter: Payer: Self-pay | Admitting: Physician Assistant

## 2017-07-17 ENCOUNTER — Ambulatory Visit: Payer: PRIVATE HEALTH INSURANCE | Admitting: Physician Assistant

## 2017-07-17 VITALS — BP 132/88 | HR 70 | Temp 98.5°F | Resp 14 | Ht 60.0 in | Wt 186.0 lb

## 2017-07-17 DIAGNOSIS — I10 Essential (primary) hypertension: Secondary | ICD-10-CM | POA: Diagnosis not present

## 2017-07-17 NOTE — Progress Notes (Signed)
Patient presents to clinic today for follow-up of hypertension. At last visit patient's metoprolol was stopped and switched to amlodipine 10 mg daily. Was continued on her current dosage of Diovan-HCT. An order for a [place] to assess for renal artery stenosis was placed. Appointment pending. Since visit, patient endorses taking medications as directed. Is tolerating well without noted side effects. Is watching diet and avoiding salt. Is hydrating well. Patient denies chest pain, palpitations, lightheadedness, dizziness, vision changes or frequent headaches. Denies new concerns today.  BP Readings from Last 3 Encounters:  07/17/17 132/88  07/10/17 (!) 180/100  07/01/17 (!) 198/95   Past Medical History:  Diagnosis Date  . Anemia   . Hypertension   . Ovarian cyst     Current Outpatient Medications on File Prior to Visit  Medication Sig Dispense Refill  . amLODipine (NORVASC) 10 MG tablet Take 1 tablet (10 mg total) by mouth daily. 30 tablet 1  . aspirin EC 81 MG tablet Take 81 mg by mouth daily.    . Cholecalciferol (VITAMIN D-3) 1000 units CAPS Take 1 capsule (1,000 Units total) by mouth daily. 30 capsule 0  . Chromium 1000 MCG TABS Take daily by mouth.    . co-enzyme Q-10 30 MG capsule Take 30 mg daily by mouth.    . potassium chloride SA (K-DUR,KLOR-CON) 20 MEQ tablet Take one tablet, twice a day for five days, then one tablet once a day. 35 tablet 0  . valsartan-hydrochlorothiazide (DIOVAN-HCT) 320-12.5 MG tablet Take 1 tablet by mouth daily. No further refills without a Blood pressure follow up. Call [phone] to schedule. 30 tablet 3  . Vitamin D, Ergocalciferol, (DRISDOL) 50000 units CAPS capsule Take 1 capsule once a week for 12 weeks 12 capsule 0   No current facility-administered medications on file prior to visit.     Allergies  Allergen Reactions  . Bee Venom     Family History  Problem Relation Age of Onset  . Heart disease Mother        CHF  . Diabetes Mother   .  Hypertension Father   . Diabetes Father   . Stroke Father   . Breast cancer Maternal Grandmother   . Hypertension Maternal Grandmother   . Stroke Maternal Grandmother   . Stroke Maternal Grandfather   . Hypertension Maternal Grandfather   . Breast cancer Paternal Grandmother   . Stroke Paternal Grandfather   . Hypertension Paternal Grandfather   . Colon cancer Neg Hx   . Colon polyps Neg Hx   . Esophageal cancer Neg Hx   . Rectal cancer Neg Hx   . Stomach cancer Neg Hx     Social History   Socioeconomic History  . Marital status: Married    Spouse name: None  . Number of children: None  . Years of education: None  . Highest education level: None  Social Needs  . Financial resource strain: None  . Food insecurity - worry: None  . Food insecurity - inability: None  . Transportation needs - medical: None  . Transportation needs - non-medical: None  Occupational History  . None  Tobacco Use  . Smoking status: Never Smoker  . Smokeless tobacco: Never Used  Substance and Sexual Activity  . Alcohol use: No  . Drug use: No  . Sexual activity: None  Other Topics Concern  . None  Social History Narrative  . None   Review of Systems - See HPI.  All other ROS are negative.  BP 132/88   Pulse 70   Temp 98.5 F (36.9 C) (Oral)   Resp 14   Ht 5' (1.524 m)   Wt 186 lb (84.4 kg)   SpO2 98%   BMI 36.33 kg/m   Physical Exam  Constitutional: She is oriented to person, place, and time and well-developed, well-nourished, and in no distress.  HENT:  Head: Normocephalic and atraumatic.  Eyes: Conjunctivae are normal.  Neck: Neck supple.  Cardiovascular: Normal rate, regular rhythm, normal heart sounds and intact distal pulses.  Pulmonary/Chest: Effort normal and breath sounds normal. No respiratory distress. She has no wheezes. She has no rales. She exhibits no tenderness.  Neurological: She is alert and oriented to person, place, and time.  Skin: Skin is warm and dry. No  rash noted.  Psychiatric: Affect normal.  Vitals reviewed.   Recent Results (from the past 2160 hour(s))  Comprehensive metabolic panel     Status: None   Collection Time: 05/29/17  8:53 AM  Result Value Ref Range   Sodium 141 135 - 145 mEq/L   Potassium 3.8 3.5 - 5.1 mEq/L   Chloride 105 96 - 112 mEq/L   CO2 30 19 - 32 mEq/L   Glucose, Bld 97 70 - 99 mg/dL   BUN 13 6 - 23 mg/dL   Creatinine, Ser 0.79 0.40 - 1.20 mg/dL   Total Bilirubin 0.5 0.2 - 1.2 mg/dL   Alkaline Phosphatase 64 39 - 117 U/L   AST 12 0 - 37 U/L   ALT 12 0 - 35 U/L   Total Protein 7.6 6.0 - 8.3 g/dL   Albumin 4.4 3.5 - 5.2 g/dL   Calcium 9.5 8.4 - 10.5 mg/dL   GFR 94.37 >60.00 mL/min  CBC with Differential/Platelet     Status: Abnormal   Collection Time: 05/29/17  8:53 AM  Result Value Ref Range   WBC 3.9 (L) 4.0 - 10.5 K/uL   RBC 4.88 3.87 - 5.11 Mil/uL   Hemoglobin 13.9 12.0 - 15.0 g/dL   HCT 42.4 36.0 - 46.0 %   MCV 86.9 78.0 - 100.0 fl   MCHC 32.9 30.0 - 36.0 g/dL   RDW 13.9 11.5 - 15.5 %   Platelets 272.0 150.0 - 400.0 K/uL   Neutrophils Relative % 43.2 43.0 - 77.0 %   Lymphocytes Relative 45.9 12.0 - 46.0 %   Monocytes Relative 8.4 3.0 - 12.0 %   Eosinophils Relative 1.9 0.0 - 5.0 %   Basophils Relative 0.6 0.0 - 3.0 %   Neutro Abs 1.7 1.4 - 7.7 K/uL   Lymphs Abs 1.8 0.7 - 4.0 K/uL   Monocytes Absolute 0.3 0.1 - 1.0 K/uL   Eosinophils Absolute 0.1 0.0 - 0.7 K/uL   Basophils Absolute 0.0 0.0 - 0.1 K/uL  Lipid panel     Status: None   Collection Time: 05/29/17  8:53 AM  Result Value Ref Range   Cholesterol 158 0 - 200 mg/dL    Comment: ATP III Classification       Desirable:  < 200 mg/dL               Borderline High:  200 - 239 mg/dL          High:  > = 240 mg/dL   Triglycerides 147.0 0.0 - 149.0 mg/dL    Comment: Normal:  <150 mg/dLBorderline High:  150 - 199 mg/dL   HDL 47.70 >39.00 mg/dL   VLDL 29.4 0.0 - 40.0 mg/dL   LDL  Cholesterol 81 0 - 99 mg/dL   Total CHOL/HDL Ratio 3      Comment:                Men          Women1/2 Average Risk     3.4          3.3Average Risk          5.0          4.42X Average Risk          9.6          7.13X Average Risk          15.0          11.0                       NonHDL 109.97     Comment: NOTE:  Non-HDL goal should be 30 mg/dL higher than patient's LDL goal (i.e. LDL goal of < 70 mg/dL, would have non-HDL goal of < 100 mg/dL)  Hemoglobin A1c     Status: None   Collection Time: 05/29/17  8:53 AM  Result Value Ref Range   Hgb A1c MFr Bld 5.6 4.6 - 6.5 %    Comment: Glycemic Control Guidelines for People with Diabetes:Non Diabetic:  <6%Goal of Therapy: <7%Additional Action Suggested:  >8%   TSH     Status: None   Collection Time: 05/29/17  8:53 AM  Result Value Ref Range   TSH 2.33 0.35 - 4.50 uIU/mL  Vitamin D (25 hydroxy)     Status: Abnormal   Collection Time: 05/29/17  8:53 AM  Result Value Ref Range   VITD 12.38 (L) 30.00 - 100.00 ng/mL  Basic metabolic panel     Status: Abnormal   Collection Time: 07/01/17  3:36 AM  Result Value Ref Range   Sodium 135 135 - 145 mmol/L   Potassium 2.9 (L) 3.5 - 5.1 mmol/L   Chloride 101 101 - 111 mmol/L   CO2 26 22 - 32 mmol/L   Glucose, Bld 125 (H) 65 - 99 mg/dL   BUN 14 6 - 20 mg/dL   Creatinine, Ser 0.92 0.44 - 1.00 mg/dL   Calcium 9.7 8.9 - 10.3 mg/dL   GFR calc non Af Amer >60 >60 mL/min   GFR calc Af Amer >60 >60 mL/min    Comment: (NOTE) The eGFR has been calculated using the CKD EPI equation. This calculation has not been validated in all clinical situations. eGFR's persistently <60 mL/min signify possible Chronic Kidney Disease.    Anion gap 8 5 - 15  CBC with Differential     Status: None   Collection Time: 07/01/17  3:36 AM  Result Value Ref Range   WBC 4.9 4.0 - 10.5 K/uL   RBC 4.83 3.87 - 5.11 MIL/uL   Hemoglobin 13.5 12.0 - 15.0 g/dL   HCT 41.5 36.0 - 46.0 %   MCV 85.9 78.0 - 100.0 fL   MCH 28.0 26.0 - 34.0 pg   MCHC 32.5 30.0 - 36.0 g/dL   RDW 13.7 11.5 -  15.5 %   Platelets 261 150 - 400 K/uL   Neutrophils Relative % 39 %   Neutro Abs 1.9 1.7 - 7.7 K/uL   Lymphocytes Relative 51 %   Lymphs Abs 2.5 0.7 - 4.0 K/uL   Monocytes Relative 8 %   Monocytes Absolute 0.4 0.1 - 1.0 K/uL   Eosinophils Relative 2 %  Eosinophils Absolute 0.1 0.0 - 0.7 K/uL   Basophils Relative 0 %   Basophils Absolute 0.0 0.0 - 0.1 K/uL  Magnesium     Status: None   Collection Time: 07/01/17  3:36 AM  Result Value Ref Range   Magnesium 1.9 1.7 - 2.4 mg/dL  Basic metabolic panel     Status: None   Collection Time: 07/10/17  3:04 PM  Result Value Ref Range   Sodium 138 135 - 145 mEq/L   Potassium 4.1 3.5 - 5.1 mEq/L   Chloride 102 96 - 112 mEq/L   CO2 31 19 - 32 mEq/L   Glucose, Bld 80 70 - 99 mg/dL   BUN 13 6 - 23 mg/dL   Creatinine, Ser 0.88 0.40 - 1.20 mg/dL   Calcium 9.8 8.4 - 10.5 mg/dL   GFR 83.29 >60.00 mL/min    Assessment/Plan: Essential hypertension BP much improved with change from Metoprolol to Amlodipine. HR within normal limits. States she is feeling much better. Will continue current regimen. Will still proceed with [place] giving history of jumps in BP despite regimens. Continue ambulatory BP monitoring. Follow-up with PCP in 4 weeks. Return sooner if needed.    Leeanne Rio, PA-C

## 2017-07-17 NOTE — Telephone Encounter (Signed)
I have ordered the plain renal US in addition to the renal artery duplex as requested.  Let me know if anything else is needed.

## 2017-07-17 NOTE — Assessment & Plan Note (Signed)
BP much improved with change from Metoprolol to Amlodipine. HR within normal limits. States she is feeling much better. Will continue current regimen. Will still proceed with [place] giving history of jumps in BP despite regimens. Continue ambulatory BP monitoring. Follow-up with PCP in 4 weeks. Return sooner if needed.

## 2017-07-17 NOTE — Patient Instructions (Signed)
Please continue current regimen. It seems to be working very well. I am glad you are feeling much better!  Keep up with the BP checks at home. Let us know if anything is climbing again. You will be contacted for your Ultrasound. I will call you with results.  Follow-up in 1 month with Dr. Birdie Riddle. Return sooner if needed.

## 2017-07-31 ENCOUNTER — Other Ambulatory Visit: Payer: Self-pay | Admitting: Emergency Medicine

## 2017-07-31 MED ORDER — POTASSIUM CHLORIDE CRYS ER 20 MEQ PO TBCR: EXTENDED_RELEASE_TABLET | ORAL | 6 refills | Status: DC

## 2017-07-31 MED FILL — POTASSIUM CL ER 20 MEQ TAB: 20 | 30 days supply | Qty: 30 | Fill #0

## 2017-08-03 MED FILL — AMLODIPINE BESYLATE 10 MG T: 10 | 30 days supply | Qty: 30 | Fill #1

## 2017-08-05 MED FILL — VALSARTAN-HCTZ 320-12.5 MG: 320-12.5 | 30 days supply | Qty: 30 | Fill #3

## 2017-08-21 ENCOUNTER — Ambulatory Visit: Payer: PRIVATE HEALTH INSURANCE | Admitting: Family Medicine

## 2017-08-28 ENCOUNTER — Ambulatory Visit: Payer: PRIVATE HEALTH INSURANCE | Admitting: Family Medicine

## 2017-08-28 ENCOUNTER — Encounter: Payer: Self-pay | Admitting: Family Medicine

## 2017-08-28 ENCOUNTER — Other Ambulatory Visit: Payer: Self-pay

## 2017-08-28 VITALS — BP 130/81 | HR 82 | Temp 98.6°F | Resp 16 | Ht 60.0 in | Wt 188.4 lb

## 2017-08-28 DIAGNOSIS — I10 Essential (primary) hypertension: Secondary | ICD-10-CM

## 2017-08-28 NOTE — Progress Notes (Signed)
   Subjective:    Patient ID: Laura Patrick, female    DOB: 1956/06/07, 62 y.o.   MRN: [id number]  HPI HTN- chronic problem.  Pt saw Elyn Aquas and Metoprolol was switched to Amlodipine 10mg  daily while continuing her Valsartan HCTZ daily.  BP remains adequately controlled today.  Pt reports she is feeling much better since BP has improved.  No CP, SOB, HAs, visual changes, edema.  Pt was having HAs and visual changes but these have resolved.   Review of Systems For ROS see HPI     Objective:   Physical Exam  Constitutional: She is oriented to person, place, and time. She appears well-developed and well-nourished. No distress.  HENT:  Head: Normocephalic and atraumatic.  Eyes: Conjunctivae and EOM are normal. Pupils are equal, round, and reactive to light.  Neck: Normal range of motion. Neck supple. No thyromegaly present.  Cardiovascular: Normal rate, regular rhythm, normal heart sounds and intact distal pulses.  No murmur heard. Pulmonary/Chest: Effort normal and breath sounds normal. No respiratory distress.  Abdominal: Soft. She exhibits no distension. There is no tenderness.  Musculoskeletal: She exhibits no edema.  Lymphadenopathy:    She has no cervical adenopathy.  Neurological: She is alert and oriented to person, place, and time.  Skin: Skin is warm and dry.  Psychiatric: She has a normal mood and affect. Her behavior is normal.  Vitals reviewed.         Assessment & Plan:

## 2017-08-28 NOTE — Assessment & Plan Note (Signed)
Chronic problem.  Adequate control today.  Asymptomatic.  No need for labs.  Applauded her efforts.  Will continue to follow.

## 2017-08-28 NOTE — Patient Instructions (Signed)
Schedule your complete physical for late October No med changes at this time- keep up the good work!!!! Call with any questions or concerns Happy New Year!!!

## 2017-09-03 ENCOUNTER — Ambulatory Visit (AMBULATORY_SURGERY_CENTER): Payer: PRIVATE HEALTH INSURANCE | Admitting: Internal Medicine

## 2017-09-03 ENCOUNTER — Other Ambulatory Visit: Payer: Self-pay

## 2017-09-03 ENCOUNTER — Encounter: Payer: Self-pay | Admitting: Internal Medicine

## 2017-09-03 VITALS — BP 132/74 | HR 57 | Temp 97.3°F | Resp 12 | Ht 60.0 in | Wt 190.0 lb

## 2017-09-03 DIAGNOSIS — Z1212 Encounter for screening for malignant neoplasm of rectum: Secondary | ICD-10-CM

## 2017-09-03 DIAGNOSIS — D120 Benign neoplasm of cecum: Secondary | ICD-10-CM

## 2017-09-03 DIAGNOSIS — K6389 Other specified diseases of intestine: Secondary | ICD-10-CM | POA: Diagnosis not present

## 2017-09-03 DIAGNOSIS — Z1211 Encounter for screening for malignant neoplasm of colon: Secondary | ICD-10-CM

## 2017-09-03 DIAGNOSIS — K635 Polyp of colon: Secondary | ICD-10-CM | POA: Diagnosis not present

## 2017-09-03 MED ORDER — SODIUM CHLORIDE 0.9 % IV SOLN: 500.0000 mL | Freq: Once | INTRAVENOUS | Status: DC

## 2017-09-03 NOTE — Op Note (Signed)
[facility] Patient Name: Laura Patrick Procedure Date: 09/03/2017 7:47 AM MRN: [id number] Endoscopist: Docia Chuck. Henrene Pastor , MD Age: 62 Referring MD:  Date of Birth: [birth year] Gender: Female Account #: [id number] Procedure:                Colonoscopy, With cold snare polypectomy x 1 Indications:              Screening for colorectal malignant neoplasm.                            Negative index exam 2008 Medicines:                Monitored Anesthesia Care Procedure:                Pre-Anesthesia Assessment:                           - Prior to the procedure, a History and Physical                            was performed, and patient medications and                            allergies were reviewed. The patient's tolerance of                            previous anesthesia was also reviewed. The risks                            and benefits of the procedure and the sedation                            options and risks were discussed with the patient.                            All questions were answered, and informed consent                            was obtained. Prior Anticoagulants: The patient has                            taken no previous anticoagulant or antiplatelet                            agents. ASA Grade Assessment: II - A patient with                            mild systemic disease. After reviewing the risks                            and benefits, the patient was deemed in                            satisfactory condition to undergo the procedure.  After obtaining informed consent, the colonoscope                            was passed under direct vision. Throughout the                            procedure, the patient's blood pressure, pulse, and                            oxygen saturations were monitored continuously. The                            Colonoscope was introduced through the anus and                             advanced to the the cecum, identified by                            appendiceal orifice and ileocecal valve. The                            ileocecal valve, appendiceal orifice, and rectum                            were photographed. The quality of the bowel                            preparation was excellent. The colonoscopy was                            performed without difficulty. The patient tolerated                            the procedure well. The bowel preparation used was                            SUPREP. Scope In: 8:31:01 AM Scope Out: 8:47:42 AM Scope Withdrawal Time: 0 hours 11 minutes 32 seconds  Total Procedure Duration: 0 hours 16 minutes 41 seconds  Findings:                 A 3 mm polyp was found in the cecum. The polyp was                            removed with a cold snare. Resection and retrieval                            were complete.                           The exam was otherwise without abnormality on                            direct and retroflexion views. Complications:  No immediate complications. Estimated blood loss:                            None. Estimated Blood Loss:     Estimated blood loss: none. Impression:               - One 3 mm polyp in the cecum, removed with a cold                            snare. Resected and retrieved.                           - The examination was otherwise normal on direct                            and retroflexion views. Recommendation:           - Repeat colonoscopy in 5-10 years for surveillance.                           - Patient has a contact number available for                            emergencies. The signs and symptoms of potential                            delayed complications were discussed with the                            patient. Return to normal activities tomorrow.                            Written discharge instructions were provided to the                             patient.                           - Resume previous diet.                           - Continue present medications.                           - Await pathology results. Docia Chuck. Henrene Pastor, MD 09/03/2017 8:53:18 AM This report has been signed electronically.

## 2017-09-03 NOTE — Patient Instructions (Signed)
YOU HAD AN ENDOSCOPIC PROCEDURE TODAY AT [facility]:   Refer to the procedure report that was given to you for any specific questions about what was found during the examination.  If the procedure report does not answer your questions, please call your gastroenterologist to clarify.  If you requested that your care partner not be given the details of your procedure findings, then the procedure report has been included in a sealed envelope for you to review at your convenience later.  YOU SHOULD EXPECT: Some feelings of bloating in the abdomen. Passage of more gas than usual.  Walking can help get rid of the air that was put into your GI tract during the procedure and reduce the bloating. If you had a lower endoscopy (such as a colonoscopy or flexible sigmoidoscopy) you may notice spotting of blood in your stool or on the toilet paper. If you underwent a bowel prep for your procedure, you may not have a normal bowel movement for a few days.  Please Note:  You might notice some irritation and congestion in your nose or some drainage.  This is from the oxygen used during your procedure.  There is no need for concern and it should clear up in a day or so.  SYMPTOMS TO REPORT IMMEDIATELY:   Following lower endoscopy (colonoscopy or flexible sigmoidoscopy):  Excessive amounts of blood in the stool  Significant tenderness or worsening of abdominal pains  Swelling of the abdomen that is new, acute  Fever of 100F or higher   For urgent or emergent issues, a gastroenterologist can be reached at any hour by calling [phone].   DIET:  We do recommend a small meal at first, but then you may proceed to your regular diet.  Drink plenty of fluids but you should avoid alcoholic beverages for 24 hours.  ACTIVITY:  You should plan to take it easy for the rest of today and you should NOT DRIVE or use heavy machinery until tomorrow (because of the sedation medicines used during the test).     FOLLOW UP: Our staff will call the number listed on your records the next business day following your procedure to check on you and address any questions or concerns that you may have regarding the information given to you following your procedure. If we do not reach you, we will leave a message.  However, if you are feeling well and you are not experiencing any problems, there is no need to return our call.  We will assume that you have returned to your regular daily activities without incident.  If any biopsies were taken you will be contacted by phone or by letter within the next 1-3 weeks.  Please call us at [phone] if you have not heard about the biopsies in 3 weeks.    SIGNATURES/CONFIDENTIALITY: You and/or your care partner have signed paperwork which will be entered into your electronic medical record.  These signatures attest to the fact that that the information above on your After Visit Summary has been reviewed and is understood.  Full responsibility of the confidentiality of this discharge information lies with you and/or your care-partner.  Read all handouts given to you by your recovery room nurse.   Thank-you for choosing [facility] for your healthcare needs today.

## 2017-09-03 NOTE — Progress Notes (Signed)
Called to room to assist during endoscopic procedure.  Patient ID and intended procedure confirmed with present staff. Received instructions for my participation in the procedure from the performing physician.  

## 2017-09-03 NOTE — Progress Notes (Signed)
To PACU, VSS. Report to [email or web address] 

## 2017-09-04 ENCOUNTER — Other Ambulatory Visit: Payer: Self-pay | Admitting: Family Medicine

## 2017-09-04 ENCOUNTER — Telehealth: Payer: Self-pay

## 2017-09-04 ENCOUNTER — Other Ambulatory Visit: Payer: Self-pay | Admitting: Physician Assistant

## 2017-09-04 DIAGNOSIS — I10 Essential (primary) hypertension: Secondary | ICD-10-CM

## 2017-09-04 MED FILL — AMLODIPINE BESYLATE 10 MG T: 10 | 30 days supply | Qty: 30 | Fill #0

## 2017-09-04 MED FILL — POTASSIUM CL ER 20 MEQ TABL: 20 | 30 days supply | Qty: 30 | Fill #1

## 2017-09-04 MED FILL — VALSARTAN-HCTZ 320-12.5 MG: 320-12.5 | 30 days supply | Qty: 30 | Fill #0

## 2017-09-04 NOTE — Telephone Encounter (Signed)
Attempted to reach pt. With follow up call.   LM on pt. Ans. Machine to call if she has any questions or concerns.

## 2017-09-04 NOTE — Telephone Encounter (Signed)
Returned patient's call. She states she is doing well, no complaints. She just wanted to express how pleased she was with her care.

## 2017-09-04 NOTE — Telephone Encounter (Signed)
Attempted to reach patient for post-procedure f/u call. No answer. Left message that we will make another attempt to reach her later today and for her to please not hesitate to call us if she has any questions/concerns regarding her care. 

## 2017-09-04 NOTE — Telephone Encounter (Signed)
Pt said she is returning your call and she is doing okay.  Requesting call back

## 2017-09-04 NOTE — Telephone Encounter (Signed)
  Follow up Call-  Call back number 09/03/2017  Post procedure Call Back phone  # [phone]  Permission to leave phone message Yes  Some recent data might be hidden     Patient questions:  Do you have a fever, pain , or abdominal swelling? No. Pain Score  0 *  Have you tolerated food without any problems? Yes.    Have you been able to return to your normal activities? Yes.    Do you have any questions about your discharge instructions: Diet   No. Medications  No. Follow up visit  No.  Do you have questions or concerns about your Care? No.  Actions: * If pain score is 4 or above: No action needed, pain <4.

## 2017-09-08 ENCOUNTER — Encounter: Payer: Self-pay | Admitting: Internal Medicine

## 2017-10-09 MED FILL — AMLODIPINE BESYLATE 10 MG T: 10 | 30 days supply | Qty: 30 | Fill #1

## 2017-10-09 MED FILL — VALSARTAN-HCTZ 320-12.5 MG: 320-12.5 | 30 days supply | Qty: 30 | Fill #1

## 2017-11-09 MED FILL — AMLODIPINE BESYLATE 10 MG T: 10 | 30 days supply | Qty: 30 | Fill #2

## 2017-11-09 MED FILL — VALSARTAN-HCTZ 320-12.5 MG: 320-12.5 | 30 days supply | Qty: 30 | Fill #2

## 2017-12-13 MED FILL — AMLODIPINE BESYLATE 10 MG T: 10 | 30 days supply | Qty: 30 | Fill #3

## 2018-01-11 MED FILL — AMLODIPINE BESYLATE 10 MG T: 10 | 30 days supply | Qty: 30 | Fill #4

## 2018-02-08 MED FILL — AMLODIPINE BESYLATE 10 MG T: 10 | 30 days supply | Qty: 30 | Fill #5

## 2018-03-18 ENCOUNTER — Other Ambulatory Visit: Payer: Self-pay | Admitting: Family Medicine

## 2018-03-18 DIAGNOSIS — I10 Essential (primary) hypertension: Secondary | ICD-10-CM

## 2018-03-18 MED FILL — AMLODIPINE BESYLATE 10 MG T: 10 | 30 days supply | Qty: 30 | Fill #0

## 2018-04-23 MED FILL — AMLODIPINE BESYLATE 10 MG T: 10 | 30 days supply | Qty: 30 | Fill #1

## 2018-05-03 ENCOUNTER — Other Ambulatory Visit: Payer: Self-pay | Admitting: Family Medicine

## 2018-05-03 DIAGNOSIS — Z1231 Encounter for screening mammogram for malignant neoplasm of breast: Secondary | ICD-10-CM

## 2018-05-05 ENCOUNTER — Ambulatory Visit: Payer: Self-pay | Admitting: Orthopaedic Surgery

## 2018-05-11 ENCOUNTER — Telehealth: Payer: Self-pay | Admitting: Radiology

## 2018-05-11 ENCOUNTER — Ambulatory Visit: Payer: PRIVATE HEALTH INSURANCE | Admitting: Orthopaedic Surgery

## 2018-05-11 ENCOUNTER — Encounter: Payer: Self-pay | Admitting: Orthopaedic Surgery

## 2018-05-11 ENCOUNTER — Ambulatory Visit (HOSPITAL_COMMUNITY)
Admission: RE | Admit: 2018-05-11 | Discharge: 2018-05-11 | Disposition: A | Discharge status: Home / Self Care | Payer: PRIVATE HEALTH INSURANCE | Source: Ambulatory Visit | Attending: Orthopaedic Surgery | Admitting: Orthopaedic Surgery

## 2018-05-11 VITALS — BP 137/82 | HR 79 | Ht 60.0 in | Wt 190.0 lb

## 2018-05-11 DIAGNOSIS — M79671 Pain in right foot: Secondary | ICD-10-CM | POA: Insufficient documentation

## 2018-05-11 NOTE — Telephone Encounter (Signed)
I called patient for Dr Luna Glasgow, he wants her to come in early for [facility] to look at her and order the xray so she can get the xray and come back  She will arrive about 130  To you FYI

## 2018-05-11 NOTE — Telephone Encounter (Signed)
Noted  

## 2018-05-11 NOTE — Progress Notes (Signed)
Subjective:    Patient ID: Laura Patrick, female    DOB: 05-Dec-1955, 62 y.o.   MRN: [id number]  HPI She dropped a large battery charge for her phone on her right foot distally about three weeks ago.  She had swelling and bruising.  She thought the pain would go away but it still hurts.  The bruising is better but she still has pain and decreased motion of the second and third toes on the right foot.  She is concerned she might have a fracture.  She has no redness.  She has been walking on it but has had to decrease her distances.  She has used ice and elevation and Advil which help.   Review of Systems  Constitutional: Positive for activity change.  Musculoskeletal: Positive for gait problem and joint swelling.  All other systems reviewed and are negative.  For Review of Systems, all other systems reviewed and are negative.  The following is a summary of the past history medically, past history surgically, known current medicines, social history and family history.  This information is gathered electronically by the computer from prior information and documentation.  I review this each visit and have found including this information at this point in the chart is beneficial and informative.   Past Medical History:  Diagnosis Date  . Anemia   . Hypertension   . Ovarian cyst     Past Surgical History:  Procedure Laterality Date  . COLONOSCOPY    . OOPHORECTOMY Right 1988  . OVARIAN CYST REMOVAL    . TOE SURGERY      Current Outpatient Medications on File Prior to Visit  Medication Sig Dispense Refill  . amLODipine (NORVASC) 10 MG tablet Take 1 tablet (10 mg total) by mouth daily. Please call [phone] to schedule an appointment. 30 tablet 1  . aspirin EC 81 MG tablet Take 81 mg by mouth daily.    . Cholecalciferol (VITAMIN D-3) 1000 units CAPS Take 1 capsule (1,000 Units total) by mouth daily. 30 capsule 0  . co-enzyme Q-10 30 MG capsule Take 30 mg daily by mouth.    .  potassium chloride SA (K-DUR,KLOR-CON) 20 MEQ tablet Take one tablet, twice a day for five days, then one tablet once a day. 30 tablet 6  . valsartan-hydrochlorothiazide (DIOVAN-HCT) 320-12.5 MG tablet Take 1 tablet by mouth daily. 30 tablet 6   Current Facility-Administered Medications on File Prior to Visit  Medication Dose Route Frequency Provider Last Rate Last Dose  . 0.9 %  sodium chloride infusion  500 mL Intravenous Once Irene Shipper, MD        Social History   Socioeconomic History  . Marital status: Married    Spouse name: Not on file  . Number of children: Not on file  . Years of education: Not on file  . Highest education level: Not on file  Occupational History  . Not on file  Social Needs  . Financial resource strain: Not on file  . Food insecurity:    Worry: Not on file    Inability: Not on file  . Transportation needs:    Medical: Not on file    Non-medical: Not on file  Tobacco Use  . Smoking status: Never Smoker  . Smokeless tobacco: Never Used  Substance and Sexual Activity  . Alcohol use: No  . Drug use: No  . Sexual activity: Not on file  Lifestyle  . Physical activity:    Days per  week: Not on file    Minutes per session: Not on file  . Stress: Not on file  Relationships  . Social connections:    Talks on phone: Not on file    Gets together: Not on file    Attends religious service: Not on file    Active member of club or organization: Not on file    Attends meetings of clubs or organizations: Not on file    Relationship status: Not on file  . Intimate partner violence:    Fear of current or ex partner: Not on file    Emotionally abused: Not on file    Physically abused: Not on file    Forced sexual activity: Not on file  Other Topics Concern  . Not on file  Social History Narrative  . Not on file    Family History  Problem Relation Age of Onset  . Heart disease Mother        CHF  . Diabetes Mother   . Hypertension Father   .  Diabetes Father   . Stroke Father   . Breast cancer Maternal Grandmother   . Hypertension Maternal Grandmother   . Stroke Maternal Grandmother   . Stroke Maternal Grandfather   . Hypertension Maternal Grandfather   . Breast cancer Paternal Grandmother   . Stroke Paternal Grandfather   . Hypertension Paternal Grandfather   . Colon cancer Neg Hx   . Colon polyps Neg Hx   . Esophageal cancer Neg Hx   . Rectal cancer Neg Hx   . Stomach cancer Neg Hx     BP 137/82   Pulse 79   Ht 5' (1.524 m)   Wt 190 lb (86.2 kg)   BMI 37.11 kg/m   Body mass index is 37.11 kg/m.     Objective:   Physical Exam  Constitutional: She is oriented to person, place, and time. She appears well-developed and well-nourished.  HENT:  Head: Normocephalic and atraumatic.  Eyes: Pupils are equal, round, and reactive to light. Conjunctivae and EOM are normal.  Neck: Normal range of motion. Neck supple.  Cardiovascular: Normal rate, regular rhythm and intact distal pulses.  Pulmonary/Chest: Effort normal.  Abdominal: Soft.  Musculoskeletal:       Right foot: There is decreased range of motion and tenderness.       Feet:  Neurological: She is alert and oriented to person, place, and time. She has normal reflexes. She displays normal reflexes. No cranial nerve deficit. She exhibits normal muscle tone. Coordination normal.  Skin: Skin is warm and dry.  Psychiatric: She has a normal mood and affect. Her behavior is normal. Judgment and thought content normal.     She had x-rays done at the hospital, see separate report.     Assessment & Plan:  , Encounter Diagnosis  Name Primary?  . Acute pain of right foot Yes   I have recommended contrast baths, sheet of instructions given.  I have recommended Aspercreme.  Continue the elevation as needed.  Return in one month.  If better, call and cancel.  Call if any problem.  Precautions discussed.   Electronically Signed Sanjuana Kava,  MD 10/8/20193:07 PM

## 2018-05-13 ENCOUNTER — Ambulatory Visit: Payer: Self-pay | Admitting: Orthopaedic Surgery

## 2018-05-20 ENCOUNTER — Other Ambulatory Visit: Payer: Self-pay | Admitting: Family Medicine

## 2018-05-20 ENCOUNTER — Ambulatory Visit
Admission: RE | Admit: 2018-05-20 | Discharge: 2018-05-20 | Disposition: A | Discharge status: Home / Self Care | Payer: PRIVATE HEALTH INSURANCE | Source: Ambulatory Visit | Attending: Family Medicine | Admitting: Family Medicine

## 2018-05-20 DIAGNOSIS — Z1231 Encounter for screening mammogram for malignant neoplasm of breast: Secondary | ICD-10-CM

## 2018-05-20 DIAGNOSIS — I10 Essential (primary) hypertension: Secondary | ICD-10-CM

## 2018-05-21 ENCOUNTER — Other Ambulatory Visit: Payer: Self-pay | Admitting: Family Medicine

## 2018-05-21 DIAGNOSIS — R928 Other abnormal and inconclusive findings on diagnostic imaging of breast: Secondary | ICD-10-CM

## 2018-05-24 ENCOUNTER — Ambulatory Visit
Admission: RE | Admit: 2018-05-24 | Discharge: 2018-05-24 | Disposition: A | Discharge status: Home / Self Care | Payer: PRIVATE HEALTH INSURANCE | Source: Ambulatory Visit | Attending: Family Medicine | Admitting: Family Medicine

## 2018-05-24 ENCOUNTER — Ambulatory Visit: Payer: PRIVATE HEALTH INSURANCE

## 2018-05-24 ENCOUNTER — Other Ambulatory Visit: Payer: Self-pay | Admitting: Family Medicine

## 2018-05-24 DIAGNOSIS — R928 Other abnormal and inconclusive findings on diagnostic imaging of breast: Secondary | ICD-10-CM

## 2018-05-25 ENCOUNTER — Encounter: Payer: Self-pay | Admitting: Family Medicine

## 2018-05-25 ENCOUNTER — Other Ambulatory Visit: Payer: Self-pay

## 2018-05-25 ENCOUNTER — Ambulatory Visit: Payer: PRIVATE HEALTH INSURANCE | Admitting: Family Medicine

## 2018-05-25 VITALS — BP 136/80 | HR 62 | Temp 98.0°F | Resp 16 | Ht 60.0 in | Wt 190.5 lb

## 2018-05-25 DIAGNOSIS — I10 Essential (primary) hypertension: Secondary | ICD-10-CM

## 2018-05-25 DIAGNOSIS — E785 Hyperlipidemia, unspecified: Secondary | ICD-10-CM

## 2018-05-25 LAB — LIPID PANEL
Cholesterol: 184 mg/dL (ref 0–200)
HDL: 51.5 mg/dL (ref >39.00)
LDL Cholesterol: 99 mg/dL (ref 0–99)
NONHDL: 132.86
Total CHOL/HDL Ratio: 4
Triglycerides: 170 mg/dL — ABNORMAL HIGH (ref 0.0–149.0)
VLDL: 34 mg/dL (ref 0.0–40.0)

## 2018-05-25 LAB — CBC WITH DIFFERENTIAL/PLATELET
BASOS PCT: 0.9 % (ref 0.0–3.0)
Basophils Absolute: 0 10*3/uL (ref 0.0–0.1)
EOS PCT: 1.4 % (ref 0.0–5.0)
Eosinophils Absolute: 0.1 10*3/uL (ref 0.0–0.7)
HEMATOCRIT: 38.6 % (ref 36.0–46.0)
HEMOGLOBIN: 13.1 g/dL (ref 12.0–15.0)
LYMPHS PCT: 43.8 % (ref 12.0–46.0)
Lymphs Abs: 1.9 10*3/uL (ref 0.7–4.0)
MCHC: 34 g/dL (ref 30.0–36.0)
MCV: 83.6 fl (ref 78.0–100.0)
MONOS PCT: 8 % (ref 3.0–12.0)
Monocytes Absolute: 0.3 10*3/uL (ref 0.1–1.0)
NEUTROS ABS: 2 10*3/uL (ref 1.4–7.7)
Neutrophils Relative %: 45.9 % (ref 43.0–77.0)
PLATELETS: 307 10*3/uL (ref 150.0–400.0)
RBC: 4.61 Mil/uL (ref 3.87–5.11)
RDW: 13.6 % (ref 11.5–15.5)
WBC: 4.3 10*3/uL (ref 4.0–10.5)

## 2018-05-25 LAB — BASIC METABOLIC PANEL
BUN: 9 mg/dL (ref 6–23)
CHLORIDE: 105 meq/L (ref 96–112)
CO2: 29 meq/L (ref 19–32)
CREATININE: 0.85 mg/dL (ref 0.40–1.20)
Calcium: 9.9 mg/dL (ref 8.4–10.5)
GFR: 86.45 mL/min (ref >60.00)
Glucose, Bld: 88 mg/dL (ref 70–99)
Potassium: 4.1 mEq/L (ref 3.5–5.1)
SODIUM: 140 meq/L (ref 135–145)

## 2018-05-25 LAB — HEPATIC FUNCTION PANEL
ALT: 12 U/L (ref 0–35)
AST: 13 U/L (ref 0–37)
Albumin: 4.7 g/dL (ref 3.5–5.2)
Alkaline Phosphatase: 94 U/L (ref 39–117)
BILIRUBIN DIRECT: 0.1 mg/dL (ref 0.0–0.3)
BILIRUBIN TOTAL: 0.4 mg/dL (ref 0.2–1.2)
Total Protein: 8 g/dL (ref 6.0–8.3)

## 2018-05-25 LAB — TSH: TSH: 2.25 u[IU]/mL (ref 0.35–4.50)

## 2018-05-25 MED ORDER — AMLODIPINE BESYLATE 10 MG PO TABS: 10.0000 mg | ORAL_TABLET | Freq: Every day | ORAL | 1 refills | Status: DC

## 2018-05-25 MED ORDER — VALSARTAN-HYDROCHLOROTHIAZIDE 320-12.5 MG PO TABS: 1.0000 | ORAL_TABLET | Freq: Every day | ORAL | 1 refills | Status: DC

## 2018-05-25 MED ORDER — POTASSIUM CHLORIDE CRYS ER 20 MEQ PO TBCR: EXTENDED_RELEASE_TABLET | ORAL | 1 refills | Status: DC

## 2018-05-25 MED FILL — AMLODIPINE BESYLATE 10 MG T: 10 | 30 days supply | Qty: 30 | Fill #0

## 2018-05-25 MED FILL — VALSARTAN-HCTZ 320-12.5 MG: 320-12.5 | 30 days supply | Qty: 30 | Fill #0

## 2018-05-25 MED FILL — POTASSIUM CL ER 20 MEQ TABL: 20 | 30 days supply | Qty: 30 | Fill #0

## 2018-05-25 NOTE — Assessment & Plan Note (Signed)
Pt has been attempting to control w/ diet and exercise.  Check labs to determine if meds are needed.

## 2018-05-25 NOTE — Patient Instructions (Signed)
Schedule your complete physical in 6 months We'll notify you of your lab results and make any changes if needed Continue to work on healthy diet and regular exercise- you can do it!! Make sure you are taking your Valsartan HCTZ AND Amlodipine daily Call with any questions or concerns Happy Fall!!!

## 2018-05-25 NOTE — Assessment & Plan Note (Signed)
Chronic problem.  BP improved to 136/80 by end of visit.  Currently asymptomatic.  Check labs.  No anticipated med changes.

## 2018-05-25 NOTE — Assessment & Plan Note (Signed)
Ongoing issue for pt.  Her BMI of 37.2 plus HTN and hyperlipidemia qualifies as morbidly obese.  Stressed need for healthy diet and regular exercise.  Check labs to risk stratify.

## 2018-05-25 NOTE — Progress Notes (Signed)
   Subjective:    Patient ID: Laura Patrick, female    DOB: [birth year], 62 y.o.   MRN: [id number]  HPI HTN- chronic problem.  On Amlodipine 10mg  and Valsartan- HCTZ 320/12.5mg  daily but pt's refill data does not support the fact that she is taking this.  BP is elevated today.  Pt reports BPs outside of office are 130s/80s.  No CP, SOB, HAs, visual changes, edema.  Hyperlipidemia- chronic problem, attempting to control w/ diet and exercise.  Walking on treadmill once weekly.  No abd pain, N/V.  Obesity- ongoing issue for pt.  BMI is 37.2  Has comorbidities of HTN, hyperlipidemia which qualifies as morbidly obese   Review of Systems For ROS see HPI     Objective:   Physical Exam  Constitutional: She is oriented to person, place, and time. She appears well-developed and well-nourished. No distress.  HENT:  Head: Normocephalic and atraumatic.  Eyes: Pupils are equal, round, and reactive to light. Conjunctivae and EOM are normal.  Neck: Normal range of motion. Neck supple. No thyromegaly present.  Cardiovascular: Normal rate, regular rhythm, normal heart sounds and intact distal pulses.  No murmur heard. Pulmonary/Chest: Effort normal and breath sounds normal. No respiratory distress.  Abdominal: Soft. She exhibits no distension. There is no tenderness.  Musculoskeletal: She exhibits no edema.  Lymphadenopathy:    She has no cervical adenopathy.  Neurological: She is alert and oriented to person, place, and time.  Skin: Skin is warm and dry.  Psychiatric: She has a normal mood and affect. Her behavior is normal.  Vitals reviewed.         Assessment & Plan:

## 2018-06-02 ENCOUNTER — Ambulatory Visit: Payer: PRIVATE HEALTH INSURANCE

## 2018-06-21 MED FILL — AMLODIPINE BESYLATE 10 MG T: 10 | 30 days supply | Qty: 30 | Fill #1

## 2018-06-21 MED FILL — POTASSIUM CHLORIDE CRYS ER: 20 | 30 days supply | Qty: 30 | Fill #1

## 2018-06-21 MED FILL — VALSARTAN-HCTZ 320-12.5 MG: 320-12.5 | 30 days supply | Qty: 30 | Fill #1

## 2018-08-02 MED FILL — AMLODIPINE BESYLATE 10 MG T: 10 | 30 days supply | Qty: 30 | Fill #2

## 2018-08-02 MED FILL — VALSARTAN-HCTZ 320-12.5 MG: 320-12.5 | 30 days supply | Qty: 30 | Fill #2

## 2018-08-02 MED FILL — POTASSIUM CHLORIDE CRYS ER: 20 | 30 days supply | Qty: 30 | Fill #2

## 2018-09-06 MED FILL — VALSARTAN-HCTZ 320-12.5 MG: 320-12.5 | 30 days supply | Qty: 30 | Fill #0

## 2018-09-06 MED FILL — POTASSIUM CHLORIDE CRYS ER: 20 | 30 days supply | Qty: 30 | Fill #0

## 2018-09-06 MED FILL — AMLODIPINE BESYLATE 10 MG T: 10 | 30 days supply | Qty: 30 | Fill #0

## 2018-10-14 MED FILL — VALSARTAN-HCTZ 320-12.5 MG: 320-12.5 | 30 days supply | Qty: 30 | Fill #1 | Status: TO

## 2018-10-14 MED FILL — AMLODIPINE BESYLATE 10 MG T: 10 | 30 days supply | Qty: 30 | Fill #1 | Status: TO

## 2018-10-14 MED FILL — POTASSIUM CHLORIDE CRYS ER: 20 | 30 days supply | Qty: 30 | Fill #1

## 2018-11-18 MED FILL — AMLODIPINE BESYLATE 10 MG T: 10 | 30 days supply | Qty: 30 | Fill #0

## 2018-11-19 MED FILL — VALSARTAN-HCTZ 320-12.5 MG: 320-12.5 | 30 days supply | Qty: 30 | Fill #0

## 2018-12-15 ENCOUNTER — Other Ambulatory Visit: Payer: Self-pay | Admitting: General Practice

## 2018-12-15 ENCOUNTER — Ambulatory Visit (INDEPENDENT_AMBULATORY_CARE_PROVIDER_SITE_OTHER): Payer: 59 | Admitting: Family Medicine

## 2018-12-15 ENCOUNTER — Encounter: Payer: Self-pay | Admitting: Family Medicine

## 2018-12-15 ENCOUNTER — Other Ambulatory Visit: Payer: Self-pay

## 2018-12-15 ENCOUNTER — Other Ambulatory Visit: Payer: Self-pay | Admitting: Family Medicine

## 2018-12-15 VITALS — BP 136/80 | HR 65 | Temp 97.9°F | Resp 16 | Ht 60.0 in | Wt 189.0 lb

## 2018-12-15 DIAGNOSIS — I10 Essential (primary) hypertension: Secondary | ICD-10-CM

## 2018-12-15 MED ORDER — POTASSIUM CHLORIDE CRYS ER 20 MEQ PO TBCR: EXTENDED_RELEASE_TABLET | ORAL | 1 refills | Status: DC

## 2018-12-15 MED ORDER — VALSARTAN-HYDROCHLOROTHIAZIDE 320-12.5 MG PO TABS: 1.0000 | ORAL_TABLET | Freq: Every day | ORAL | 1 refills | Status: DC

## 2018-12-15 MED FILL — VALSARTAN-HCTZ 320-12.5 MG: 320-12.5 | 30 days supply | Qty: 30 | Fill #0

## 2018-12-15 MED FILL — POTASSIUM CHLORIDE CRYS ER: 20 | 90 days supply | Qty: 90 | Fill #0

## 2018-12-15 MED FILL — AMLODIPINE BESYLATE 10 MG T: 10 | 90 days supply | Qty: 90 | Fill #0

## 2018-12-15 NOTE — Progress Notes (Signed)
   Virtual Visit via Video   I connected with patient on 12/15/18 at  3:20 PM EDT by a video enabled telemedicine application and verified that I am speaking with the correct person using two identifiers.  Location patient: Home Location provider: Acupuncturist, Office Persons participating in the virtual visit: Patient, Provider, [facility] (Jess B)  I discussed the limitations of evaluation and management by telemedicine and the availability of in person appointments. The patient expressed understanding and agreed to proceed.  Subjective:   HPI:   HTN- chronic problem, on Amlodipine 10mg  daily and Valsartan HCTZ 320/12.5mg  daily w/ adequate control.  No CP, SOB, HAs, visual changes, abd pain, N/V.  Obesity- ongoing issue for pt.  Doing the treadmill 1 day/week.  Weight is stable.  ROS:   See pertinent positives and negatives per HPI.  Patient Active Problem List   Diagnosis Date Noted  . Visit for preventive health examination 05/29/2017  . History of vitamin D deficiency 05/29/2017  . Physical exam 08/10/2014  . Encounter for Papanicolaou smear of cervix 08/10/2014  . Morbid obesity ([place]) 10/14/2013  . Pain in limb 05/23/2010  . PES PLANUS 05/23/2010  . PARESTHESIA 05/23/2010  . HYPOPOTASSEMIA 08/30/2008  . Hyperlipidemia 04/12/2007  . SYNDROME, CARPAL TUNNEL 04/12/2007  . Essential hypertension 04/12/2007    Social History   Tobacco Use  . Smoking status: Never Smoker  . Smokeless tobacco: Never Used  Substance Use Topics  . Alcohol use: No    Current Outpatient Medications:  .  aspirin EC 81 MG tablet, Take 81 mg by mouth daily., Disp: , Rfl:  .  Cholecalciferol (VITAMIN D-3) 1000 units CAPS, Take 1 capsule (1,000 Units total) by mouth daily., Disp: 30 capsule, Rfl: 0 .  co-enzyme Q-10 30 MG capsule, Take 30 mg daily by mouth., Disp: , Rfl:  .  potassium chloride SA (K-DUR,KLOR-CON) 20 MEQ tablet, Take one tablet by mouth daily., Disp: 90 tablet, Rfl: 1 .   valsartan-hydrochlorothiazide (DIOVAN-HCT) 320-12.5 MG tablet, Take 1 tablet by mouth daily., Disp: 90 tablet, Rfl: 1 .  amLODipine (NORVASC) 10 MG tablet, TAKE 1 TABLET BY MOUTH ONCE DAILY. PLEASE CALL [phone] TO SCHEDULE APPT, Disp: 90 tablet, Rfl: 0  Allergies  Allergen Reactions  . Bee Venom     Objective:   BP 136/80   Pulse 65   Temp 97.9 F (36.6 C) (Oral)   Resp 16   Ht 5' (1.524 m)   Wt 189 lb (85.7 kg)   SpO2 98%   BMI 36.91 kg/m   AAOx3, NAD NCAT, EOMI No obvious CN deficits Coloring WNL Pt is able to speak clearly, coherently without shortness of breath or increased work of breathing.  Thought process is linear.  Mood is appropriate.   Assessment and Plan:   HTN- chronic problem.  Adequate control.  Asymptomatic.  Check labs.  No anticipated med changes.  Will follow.  Obesity- chronic problem.  Pt has recently started exercising 1 day/week.  Encouraged her to increase to twice weekly and then 3x/week.  Pt is agreeable to this.  Check labs to risk stratify.  Will follow.   Annye Asa, MD 12/15/2018

## 2018-12-15 NOTE — Progress Notes (Signed)
I have discussed the procedure for the virtual visit with the patient who has given consent to proceed with assessment and treatment.   Tayden Nichelson L Norm Wray, CMA     

## 2018-12-17 ENCOUNTER — Telehealth: Payer: Self-pay | Admitting: Family Medicine

## 2018-12-17 NOTE — Telephone Encounter (Signed)
LM asking pt to call back to schedule her cpe in November with Dr. Birdie Riddle

## 2019-01-03 ENCOUNTER — Other Ambulatory Visit: Payer: 59 | Admitting: *Deleted

## 2019-01-03 ENCOUNTER — Other Ambulatory Visit: Payer: Self-pay | Admitting: Nurse Practitioner

## 2019-01-03 ENCOUNTER — Other Ambulatory Visit: Payer: Self-pay

## 2019-01-03 DIAGNOSIS — I10 Essential (primary) hypertension: Secondary | ICD-10-CM

## 2019-01-03 DIAGNOSIS — E785 Hyperlipidemia, unspecified: Secondary | ICD-10-CM | POA: Diagnosis not present

## 2019-01-03 DIAGNOSIS — Z1329 Encounter for screening for other suspected endocrine disorder: Secondary | ICD-10-CM

## 2019-01-03 LAB — LIPID PANEL
Chol/HDL Ratio: 3.7 ratio (ref 0.0–4.4)
Cholesterol, Total: 172 mg/dL (ref 100–199)
HDL: 46 mg/dL (ref >39)
LDL Calculated: 82 mg/dL (ref 0–99)
Triglycerides: 221 mg/dL — ABNORMAL HIGH (ref 0–149)
VLDL Cholesterol Cal: 44 mg/dL — ABNORMAL HIGH (ref 5–40)

## 2019-01-03 LAB — TSH: TSH: 3.63 u[IU]/mL (ref 0.450–4.500)

## 2019-01-03 LAB — CBC
Hematocrit: 38.9 % (ref 34.0–46.6)
Hemoglobin: 13.1 g/dL (ref 11.1–15.9)
MCH: 27.7 pg (ref 26.6–33.0)
MCHC: 33.7 g/dL (ref 31.5–35.7)
MCV: 82 fL (ref 79–97)
Platelets: 320 10*3/uL (ref 150–450)
RBC: 4.73 x10E6/uL (ref 3.77–5.28)
RDW: 13.1 % (ref 11.7–15.4)
WBC: 4.9 10*3/uL (ref 3.4–10.8)

## 2019-01-03 LAB — BASIC METABOLIC PANEL
BUN/Creatinine Ratio: 15 (ref 12–28)
BUN: 15 mg/dL (ref 8–27)
CO2: 23 mmol/L (ref 20–29)
Calcium: 10 mg/dL (ref 8.7–10.3)
Chloride: 102 mmol/L (ref 96–106)
Creatinine, Ser: 0.97 mg/dL (ref 0.57–1.00)
GFR calc Af Amer: 71 mL/min/{1.73_m2} (ref >59)
GFR calc non Af Amer: 61 mL/min/{1.73_m2} (ref >59)
Glucose: 105 mg/dL — ABNORMAL HIGH (ref 65–99)
Potassium: 4.1 mmol/L (ref 3.5–5.2)
Sodium: 141 mmol/L (ref 134–144)

## 2019-01-03 LAB — HEPATIC FUNCTION PANEL
ALT: 15 IU/L (ref 0–32)
AST: 14 IU/L (ref 0–40)
Albumin: 4.8 g/dL (ref 3.8–4.8)
Alkaline Phosphatase: 91 IU/L (ref 39–117)
Bilirubin Total: 0.3 mg/dL (ref 0.0–1.2)
Bilirubin, Direct: 0.08 mg/dL (ref 0.00–0.40)
Total Protein: 8 g/dL (ref 6.0–8.5)

## 2019-01-04 ENCOUNTER — Encounter: Payer: Self-pay | Admitting: General Practice

## 2019-02-10 DIAGNOSIS — M79642 Pain in left hand: Secondary | ICD-10-CM | POA: Insufficient documentation

## 2019-02-10 DIAGNOSIS — M79641 Pain in right hand: Secondary | ICD-10-CM | POA: Diagnosis not present

## 2019-04-04 ENCOUNTER — Other Ambulatory Visit: Payer: Self-pay | Admitting: Family Medicine

## 2019-04-04 DIAGNOSIS — I10 Essential (primary) hypertension: Secondary | ICD-10-CM

## 2019-04-04 MED FILL — AMLODIPINE BESYLATE 10 MG T: 10 | 90 days supply | Qty: 90 | Fill #0

## 2019-04-04 MED FILL — VALSARTAN-HCTZ 320-12.5 MG: 320-12.5 | 30 days supply | Qty: 30 | Fill #1

## 2019-04-04 MED FILL — POTASSIUM CHLORIDE CRYS ER: 20 | 90 days supply | Qty: 90 | Fill #1

## 2019-04-13 DIAGNOSIS — G5603 Carpal tunnel syndrome, bilateral upper limbs: Secondary | ICD-10-CM | POA: Diagnosis not present

## 2019-04-14 DIAGNOSIS — G5603 Carpal tunnel syndrome, bilateral upper limbs: Secondary | ICD-10-CM | POA: Diagnosis not present

## 2019-07-31 MED FILL — VALSARTAN-HCTZ 320-12.5 MG: 320-12.5 | 30 days supply | Qty: 30 | Fill #2

## 2019-08-01 ENCOUNTER — Other Ambulatory Visit: Payer: Self-pay | Admitting: Family Medicine

## 2019-08-01 DIAGNOSIS — I10 Essential (primary) hypertension: Secondary | ICD-10-CM

## 2019-08-01 MED FILL — POTASSIUM CHLORIDE CRYS ER: 20 | 30 days supply | Qty: 30 | Fill #0

## 2019-08-01 MED FILL — AMLODIPINE BESYLATE 10 MG T: 10 | 30 days supply | Qty: 30 | Fill #0

## 2019-09-28 ENCOUNTER — Other Ambulatory Visit: Payer: Self-pay | Admitting: Family Medicine

## 2019-09-28 DIAGNOSIS — I10 Essential (primary) hypertension: Secondary | ICD-10-CM

## 2019-09-28 MED FILL — VALSARTAN-HCTZ 320-12.5 MG: 320-12.5 | 30 days supply | Qty: 30 | Fill #0

## 2019-09-28 MED FILL — POTASSIUM CHLORIDE CRYS ER: 20 | 30 days supply | Qty: 30 | Fill #0

## 2019-10-05 ENCOUNTER — Other Ambulatory Visit: Payer: Self-pay | Admitting: Family Medicine

## 2019-10-05 DIAGNOSIS — I10 Essential (primary) hypertension: Secondary | ICD-10-CM

## 2019-10-11 ENCOUNTER — Encounter: Payer: Self-pay | Admitting: Family Medicine

## 2019-11-08 ENCOUNTER — Encounter: Payer: 59 | Admitting: Family Medicine

## 2019-11-08 ENCOUNTER — Telehealth: Payer: Self-pay | Admitting: Family Medicine

## 2019-11-08 DIAGNOSIS — I10 Essential (primary) hypertension: Secondary | ICD-10-CM

## 2019-11-08 MED ORDER — POTASSIUM CHLORIDE CRYS ER 20 MEQ PO TBCR: 20.0000 meq | EXTENDED_RELEASE_TABLET | Freq: Every day | ORAL | 0 refills | Status: DC

## 2019-11-08 MED ORDER — AMLODIPINE BESYLATE 10 MG PO TABS: 10.0000 mg | ORAL_TABLET | Freq: Every day | ORAL | 0 refills | Status: DC

## 2019-11-08 MED ORDER — VALSARTAN-HYDROCHLOROTHIAZIDE 320-12.5 MG PO TABS: 1.0000 | ORAL_TABLET | Freq: Every day | ORAL | 0 refills | Status: DC

## 2019-11-08 NOTE — Telephone Encounter (Signed)
Pt had to move her cpe because of the water issue. Pt needs refills on the Amlodipine, patassium, and the Valsartan. Pt uses [facility] outpt pharmacy.

## 2019-11-08 NOTE — Telephone Encounter (Signed)
Medication filled to pharmacy as requested.   

## 2019-11-17 MED FILL — POTASSIUM CHLORIDE CRYS ER: 20 | 90 days supply | Qty: 90 | Fill #0 | Status: TO

## 2019-11-17 MED FILL — VALSARTAN-HCTZ 320-12.5 MG: 320-12.5 | 90 days supply | Qty: 90 | Fill #0

## 2019-11-17 MED FILL — AMLODIPINE BESYLATE 10 MG T: 10 | 90 days supply | Qty: 90 | Fill #0

## 2019-11-17 MED FILL — POTASSIUM CHLORIDE CRYS ER: 20 | 90 days supply | Qty: 90 | Fill #0

## 2019-11-17 MED FILL — AMLODIPINE BESYLATE 10 MG T: 10 | 90 days supply | Qty: 90 | Fill #0 | Status: TO

## 2019-11-17 MED FILL — VALSARTAN-HCTZ 320-12.5 MG: 320-12.5 | 90 days supply | Qty: 90 | Fill #0 | Status: TO

## 2019-12-28 ENCOUNTER — Encounter: Payer: Self-pay | Admitting: Family Medicine

## 2019-12-28 ENCOUNTER — Other Ambulatory Visit: Payer: Self-pay

## 2019-12-28 ENCOUNTER — Ambulatory Visit (INDEPENDENT_AMBULATORY_CARE_PROVIDER_SITE_OTHER): Payer: 59 | Admitting: Family Medicine

## 2019-12-28 ENCOUNTER — Ambulatory Visit: Payer: 59 | Admitting: Family Medicine

## 2019-12-28 VITALS — BP 134/85 | HR 83 | Temp 97.9°F | Resp 16 | Ht 60.0 in | Wt 191.0 lb

## 2019-12-28 DIAGNOSIS — Z780 Asymptomatic menopausal state: Secondary | ICD-10-CM | POA: Diagnosis not present

## 2019-12-28 DIAGNOSIS — Z1231 Encounter for screening mammogram for malignant neoplasm of breast: Secondary | ICD-10-CM | POA: Diagnosis not present

## 2019-12-28 DIAGNOSIS — E559 Vitamin D deficiency, unspecified: Secondary | ICD-10-CM

## 2019-12-28 DIAGNOSIS — Z0000 Encounter for general adult medical examination without abnormal findings: Secondary | ICD-10-CM | POA: Diagnosis not present

## 2019-12-28 LAB — BASIC METABOLIC PANEL
BUN: 13 mg/dL (ref 6–23)
CO2: 28 mEq/L (ref 19–32)
Calcium: 10.3 mg/dL (ref 8.4–10.5)
Chloride: 102 mEq/L (ref 96–112)
Creatinine, Ser: 0.89 mg/dL (ref 0.40–1.20)
GFR: 76.75 mL/min (ref >60.00)
Glucose, Bld: 87 mg/dL (ref 70–99)
Potassium: 3.9 mEq/L (ref 3.5–5.1)
Sodium: 140 mEq/L (ref 135–145)

## 2019-12-28 LAB — HEPATIC FUNCTION PANEL
ALT: 15 U/L (ref 0–35)
AST: 18 U/L (ref 0–37)
Albumin: 4.9 g/dL (ref 3.5–5.2)
Alkaline Phosphatase: 63 U/L (ref 39–117)
Bilirubin, Direct: 0.1 mg/dL (ref 0.0–0.3)
Total Bilirubin: 0.6 mg/dL (ref 0.2–1.2)
Total Protein: 8.4 g/dL — ABNORMAL HIGH (ref 6.0–8.3)

## 2019-12-28 LAB — CBC WITH DIFFERENTIAL/PLATELET
Basophils Absolute: 0.1 10*3/uL (ref 0.0–0.1)
Basophils Relative: 1.4 % (ref 0.0–3.0)
Eosinophils Absolute: 0.1 10*3/uL (ref 0.0–0.7)
Eosinophils Relative: 1.4 % (ref 0.0–5.0)
HCT: 42.6 % (ref 36.0–46.0)
Hemoglobin: 14.1 g/dL (ref 12.0–15.0)
Lymphocytes Relative: 38.3 % (ref 12.0–46.0)
Lymphs Abs: 1.6 10*3/uL (ref 0.7–4.0)
MCHC: 33 g/dL (ref 30.0–36.0)
MCV: 84.1 fl (ref 78.0–100.0)
Monocytes Absolute: 0.4 10*3/uL (ref 0.1–1.0)
Monocytes Relative: 9.4 % (ref 3.0–12.0)
Neutro Abs: 2.1 10*3/uL (ref 1.4–7.7)
Neutrophils Relative %: 49.5 % (ref 43.0–77.0)
Platelets: 282 10*3/uL (ref 150.0–400.0)
RBC: 5.06 Mil/uL (ref 3.87–5.11)
RDW: 14 % (ref 11.5–15.5)
WBC: 4.3 10*3/uL (ref 4.0–10.5)

## 2019-12-28 LAB — LIPID PANEL
Cholesterol: 180 mg/dL (ref 0–200)
HDL: 58.1 mg/dL (ref >39.00)
LDL Cholesterol: 101 mg/dL — ABNORMAL HIGH (ref 0–99)
NonHDL: 121.7
Total CHOL/HDL Ratio: 3
Triglycerides: 105 mg/dL (ref 0.0–149.0)
VLDL: 21 mg/dL (ref 0.0–40.0)

## 2019-12-28 LAB — VITAMIN D 25 HYDROXY (VIT D DEFICIENCY, FRACTURES): VITD: 34.62 ng/mL (ref 30.00–100.00)

## 2019-12-28 LAB — TSH: TSH: 2.17 u[IU]/mL (ref 0.35–4.50)

## 2019-12-28 NOTE — Progress Notes (Signed)
   Subjective:    Patient ID: Laura Patrick, female    DOB: 1956/05/12, 64 y.o.   MRN: [id number]  HPI CPE- UTD on Tdap, colonoscopy.  Due for mammo and DEXA.  UTD on COVID vaccines   Review of Systems Patient reports no vision/ hearing changes, adenopathy,fever, weight change,  persistant/recurrent hoarseness , swallowing issues, chest pain, palpitations, edema, persistant/recurrent cough, hemoptysis, dyspnea (rest/exertional/paroxysmal nocturnal), gastrointestinal bleeding (melena, rectal bleeding), abdominal pain, significant heartburn, bowel changes, GU symptoms (dysuria, hematuria, incontinence), Gyn symptoms (abnormal  bleeding, pain),  syncope, focal weakness, memory loss, numbness & tingling, skin/hair/nail changes, abnormal bruising or bleeding, anxiety, or depression.   This visit occurred during the SARS-CoV-2 public health emergency.  Safety protocols were in place, including screening questions prior to the visit, additional usage of staff PPE, and extensive cleaning of exam room while observing appropriate contact time as indicated for disinfecting solutions.       Objective:   Physical Exam General Appearance:    Alert, cooperative, no distress, appears stated age  Head:    Normocephalic, without obvious abnormality, atraumatic  Eyes:    PERRL, conjunctiva/corneas clear, EOM's intact, fundi    benign, both eyes  Ears:    Normal TM's and external ear canals, both ears  Nose:   Deferred due to COVID  Throat:   Neck:   Supple, symmetrical, trachea midline, no adenopathy;    Thyroid: no enlargement/tenderness/nodules  Back:     Symmetric, no curvature, ROM normal, no CVA tenderness  Lungs:     Clear to auscultation bilaterally, respirations unlabored  Chest Wall:    No tenderness or deformity   Heart:    Regular rate and rhythm, S1 and S2 normal, no murmur, rub   or gallop  Breast Exam:    Deferred to mammo  Abdomen:     Soft, non-tender, bowel sounds active all four  quadrants,    no masses, no organomegaly  Genitalia:    Deferred  Rectal:    Extremities:   Extremities normal, atraumatic, no cyanosis or edema  Pulses:   2+ and symmetric all extremities  Skin:   Skin color, texture, turgor normal, no rashes or lesions  Lymph nodes:   Cervical, supraclavicular, and axillary nodes normal  Neurologic:   CNII-XII intact, normal strength, sensation and reflexes    throughout          Assessment & Plan:

## 2019-12-28 NOTE — Assessment & Plan Note (Signed)
Ongoing issue for pt.  BMI is 37.3 but given her HTN and hyperlipidemia this qualifies as morbidly obese.  Stressed need for healthy diet and regular exercise.  Check labs to risk stratify.  Will follow.

## 2019-12-28 NOTE — Assessment & Plan Note (Signed)
Pt's PE WNL w/ exception of obesity.  UTD on Tdap, COVID, colonoscopy.  Due for mammo and DEXA- ordered.  Check labs.  Anticipatory guidance provided.

## 2019-12-28 NOTE — Patient Instructions (Signed)
Follow up in 6 months to recheck BP We'll notify you of your lab results and make any changes if needed Your order is in for both the mammogram and bone density- if you don't hear from them, call [facility] Continue to work on healthy diet and regular exercise- you can do it! Call with any questions or concerns Have a great summer!!!

## 2019-12-28 NOTE — Assessment & Plan Note (Signed)
Pt has hx of this.  Check labs and replete prn. 

## 2019-12-29 ENCOUNTER — Encounter: Payer: Self-pay | Admitting: General Practice

## 2019-12-29 NOTE — Progress Notes (Signed)
Called pt and lmovm to return call.

## 2020-04-16 ENCOUNTER — Other Ambulatory Visit: Payer: Self-pay | Admitting: Family Medicine

## 2020-04-16 DIAGNOSIS — I10 Essential (primary) hypertension: Secondary | ICD-10-CM

## 2020-04-16 MED FILL — POTASSIUM CHLORIDE CRYS ER: 20 | 90 days supply | Qty: 90 | Fill #0

## 2020-04-16 MED FILL — VALSARTAN-HCTZ 320-12.5 MG: 320-12.5 | 90 days supply | Qty: 90 | Fill #0

## 2020-04-16 MED FILL — AMLODIPINE BESYLATE 10 MG T: 10 | 90 days supply | Qty: 90 | Fill #0

## 2020-10-15 ENCOUNTER — Other Ambulatory Visit: Payer: Self-pay | Admitting: Family Medicine

## 2020-10-15 DIAGNOSIS — I10 Essential (primary) hypertension: Secondary | ICD-10-CM

## 2020-11-03 ENCOUNTER — Other Ambulatory Visit (HOSPITAL_COMMUNITY): Payer: Self-pay

## 2020-11-13 ENCOUNTER — Ambulatory Visit: Payer: 59 | Admitting: Family Medicine

## 2020-11-13 ENCOUNTER — Other Ambulatory Visit (HOSPITAL_COMMUNITY): Payer: Self-pay

## 2020-11-13 ENCOUNTER — Encounter: Payer: Self-pay | Admitting: Family Medicine

## 2020-11-13 ENCOUNTER — Other Ambulatory Visit: Payer: Self-pay

## 2020-11-13 VITALS — BP 130/80 | HR 72 | Temp 97.5°F | Resp 17 | Ht 60.0 in | Wt 187.0 lb

## 2020-11-13 DIAGNOSIS — H3509 Other intraretinal microvascular abnormalities: Secondary | ICD-10-CM | POA: Diagnosis not present

## 2020-11-13 DIAGNOSIS — I10 Essential (primary) hypertension: Secondary | ICD-10-CM

## 2020-11-13 LAB — BASIC METABOLIC PANEL
BUN: 13 mg/dL (ref 6–23)
CO2: 27 mEq/L (ref 19–32)
Calcium: 10.1 mg/dL (ref 8.4–10.5)
Chloride: 102 mEq/L (ref 96–112)
Creatinine, Ser: 0.93 mg/dL (ref 0.40–1.20)
GFR: 63.83 mL/min (ref >60.00)
Glucose, Bld: 85 mg/dL (ref 70–99)
Potassium: 3.8 mEq/L (ref 3.5–5.1)
Sodium: 138 mEq/L (ref 135–145)

## 2020-11-13 LAB — HEPATIC FUNCTION PANEL
ALT: 17 U/L (ref 0–35)
AST: 17 U/L (ref 0–37)
Albumin: 4.5 g/dL (ref 3.5–5.2)
Alkaline Phosphatase: 65 U/L (ref 39–117)
Bilirubin, Direct: 0.1 mg/dL (ref 0.0–0.3)
Total Bilirubin: 0.5 mg/dL (ref 0.2–1.2)
Total Protein: 8.6 g/dL — ABNORMAL HIGH (ref 6.0–8.3)

## 2020-11-13 LAB — LIPID PANEL
Cholesterol: 160 mg/dL (ref 0–200)
HDL: 52.9 mg/dL (ref >39.00)
LDL Cholesterol: 85 mg/dL (ref 0–99)
NonHDL: 106.79
Total CHOL/HDL Ratio: 3
Triglycerides: 111 mg/dL (ref 0.0–149.0)
VLDL: 22.2 mg/dL (ref 0.0–40.0)

## 2020-11-13 NOTE — Assessment & Plan Note (Signed)
New.  Reviewed ophthalmology report.  'arteriolar narrowing' was seen L>R and it indicated pt was at risk for an optic stroke.  BP is already controlled today so will check cholesterol to determine if we need to be aggressive w/ risk factor modification.  Will also get carotid dopplers to assess.  Pt expressed understanding and is in agreement w/ plan.

## 2020-11-13 NOTE — Patient Instructions (Addendum)
Follow up as scheduled next month for your physical We'll notify you of your lab results and make any changes if needed Continue the blood pressure medicine as is.  No changes at this time We'll get your carotid doppler set up Call with any questions or concerns Hang in there!!!

## 2020-11-13 NOTE — Assessment & Plan Note (Signed)
Chronic problem.  Well controlled today on Amlodipine and Valsartan HCTZ.  She has a history of noncompliance but has been doing very well recently.  She is aware that her very high pressures likely did some of the damage now seen in her eyes, but wants to know next steps.  Since BP looks good today, no med changes at this time.  Will follow closely.

## 2020-11-13 NOTE — Progress Notes (Signed)
   Subjective:    Patient ID: Laura Patrick, female    DOB: 1955-11-25, 65 y.o.   MRN: [id number]  HPI HTN- chronic problem, on Amlodipine 10mg  daily, Valsartan HCTZ 320/12.5mg  daily w/ good control today.  Pt had eye exam last week and new provider told her that she was at high risk for a optic stroke due to arteriolar narrowing.   Review of Systems For ROS see HPI   This visit occurred during the SARS-CoV-2 public health emergency.  Safety protocols were in place, including screening questions prior to the visit, additional usage of staff PPE, and extensive cleaning of exam room while observing appropriate contact time as indicated for disinfecting solutions.       Objective:   Physical Exam Vitals reviewed.  Constitutional:      General: She is not in acute distress.    Appearance: Normal appearance. She is well-developed. She is not ill-appearing.  HENT:     Head: Normocephalic and atraumatic.  Eyes:     Conjunctiva/sclera: Conjunctivae normal.     Pupils: Pupils are equal, round, and reactive to light.  Neck:     Thyroid: No thyromegaly.  Cardiovascular:     Rate and Rhythm: Normal rate and regular rhythm.     Pulses: Normal pulses.     Heart sounds: Normal heart sounds. No murmur heard.   Pulmonary:     Effort: Pulmonary effort is normal. No respiratory distress.     Breath sounds: Normal breath sounds.  Abdominal:     General: There is no distension.     Palpations: Abdomen is soft.     Tenderness: There is no abdominal tenderness.  Musculoskeletal:     Cervical back: Normal range of motion and neck supple.     Right lower leg: No edema.     Left lower leg: No edema.  Lymphadenopathy:     Cervical: No cervical adenopathy.  Skin:    General: Skin is warm and dry.  Neurological:     Mental Status: She is alert and oriented to person, place, and time.  Psychiatric:        Behavior: Behavior normal.           Assessment & Plan:

## 2020-12-14 ENCOUNTER — Ambulatory Visit: Payer: 59 | Attending: Internal Medicine

## 2020-12-14 DIAGNOSIS — Z23 Encounter for immunization: Secondary | ICD-10-CM

## 2020-12-14 NOTE — Progress Notes (Signed)
   Covid-19 Vaccination Clinic  Name:  LONITA DEBES    MRN: [id number] DOB: 05-Oct-1955  12/14/2020  Ms. Schwartzkopf was observed post Covid-19 immunization for 15 minutes without incident. She was provided with Vaccine Information Sheet and instruction to access the V-Safe system.   Ms. Sinor was instructed to call 911 with any severe reactions post vaccine: Marland Kitchen Difficulty breathing  . Swelling of face and throat  . A fast heartbeat  . A bad rash all over body  . Dizziness and weakness   Immunizations Administered    Name Date Dose VIS Date Route   Moderna Covid-19 Booster Vaccine 12/14/2020  1:41 PM 0.25 mL 05/23/2020 Intramuscular   Manufacturer: Moderna   Lot: [id number]   [place]: [id number]

## 2020-12-17 ENCOUNTER — Other Ambulatory Visit (HOSPITAL_BASED_OUTPATIENT_CLINIC_OR_DEPARTMENT_OTHER): Payer: Self-pay

## 2020-12-17 MED ORDER — MODERNA COVID-19 VACCINE 100 MCG/0.5ML IM SUSP
INTRAMUSCULAR | 0 refills | Status: DC
  Filled 2020-12-17: qty 0.25, 1d supply, fill #0

## 2020-12-18 ENCOUNTER — Other Ambulatory Visit (HOSPITAL_BASED_OUTPATIENT_CLINIC_OR_DEPARTMENT_OTHER): Payer: Self-pay

## 2020-12-28 ENCOUNTER — Encounter: Payer: 59 | Admitting: Family Medicine

## 2021-01-24 ENCOUNTER — Other Ambulatory Visit: Payer: Self-pay

## 2021-01-24 ENCOUNTER — Encounter: Payer: Self-pay | Admitting: Family Medicine

## 2021-01-24 ENCOUNTER — Ambulatory Visit (INDEPENDENT_AMBULATORY_CARE_PROVIDER_SITE_OTHER): Payer: 59 | Admitting: Family Medicine

## 2021-01-24 VITALS — BP 121/78 | HR 59 | Temp 97.3°F | Resp 18 | Ht 59.5 in | Wt 186.6 lb

## 2021-01-24 DIAGNOSIS — Z0000 Encounter for general adult medical examination without abnormal findings: Secondary | ICD-10-CM | POA: Diagnosis not present

## 2021-01-24 DIAGNOSIS — Z23 Encounter for immunization: Secondary | ICD-10-CM | POA: Diagnosis not present

## 2021-01-24 DIAGNOSIS — E559 Vitamin D deficiency, unspecified: Secondary | ICD-10-CM | POA: Diagnosis not present

## 2021-01-24 LAB — CBC WITH DIFFERENTIAL/PLATELET
Basophils Absolute: 0 10*3/uL (ref 0.0–0.1)
Basophils Relative: 1.1 % (ref 0.0–3.0)
Eosinophils Absolute: 0.1 10*3/uL (ref 0.0–0.7)
Eosinophils Relative: 1.8 % (ref 0.0–5.0)
HCT: 40.5 % (ref 36.0–46.0)
Hemoglobin: 13.6 g/dL (ref 12.0–15.0)
Lymphocytes Relative: 40.4 % (ref 12.0–46.0)
Lymphs Abs: 1.7 10*3/uL (ref 0.7–4.0)
MCHC: 33.7 g/dL (ref 30.0–36.0)
MCV: 85.3 fl (ref 78.0–100.0)
Monocytes Absolute: 0.4 10*3/uL (ref 0.1–1.0)
Monocytes Relative: 8.8 % (ref 3.0–12.0)
Neutro Abs: 2 10*3/uL (ref 1.4–7.7)
Neutrophils Relative %: 47.9 % (ref 43.0–77.0)
Platelets: 315 10*3/uL (ref 150.0–400.0)
RBC: 4.75 Mil/uL (ref 3.87–5.11)
RDW: 14.6 % (ref 11.5–15.5)
WBC: 4.2 10*3/uL (ref 4.0–10.5)

## 2021-01-24 LAB — VITAMIN D 25 HYDROXY (VIT D DEFICIENCY, FRACTURES): VITD: 24.05 ng/mL — ABNORMAL LOW (ref 30.00–100.00)

## 2021-01-24 LAB — LIPID PANEL
Cholesterol: 175 mg/dL (ref 0–200)
HDL: 54 mg/dL (ref >39.00)
LDL Cholesterol: 96 mg/dL (ref 0–99)
NonHDL: 121.11
Total CHOL/HDL Ratio: 3
Triglycerides: 124 mg/dL (ref 0.0–149.0)
VLDL: 24.8 mg/dL (ref 0.0–40.0)

## 2021-01-24 LAB — HEPATIC FUNCTION PANEL
ALT: 12 U/L (ref 0–35)
AST: 7 U/L (ref 0–37)
Albumin: 4.6 g/dL (ref 3.5–5.2)
Alkaline Phosphatase: 66 U/L (ref 39–117)
Bilirubin, Direct: 0.1 mg/dL (ref 0.0–0.3)
Total Bilirubin: 0.5 mg/dL (ref 0.2–1.2)
Total Protein: 8 g/dL (ref 6.0–8.3)

## 2021-01-24 LAB — BASIC METABOLIC PANEL
BUN: 13 mg/dL (ref 6–23)
CO2: 26 mEq/L (ref 19–32)
Calcium: 9.6 mg/dL (ref 8.4–10.5)
Chloride: 102 mEq/L (ref 96–112)
Creatinine, Ser: 0.84 mg/dL (ref 0.40–1.20)
GFR: 72.02 mL/min (ref >60.00)
Glucose, Bld: 88 mg/dL (ref 70–99)
Potassium: 3.7 mEq/L (ref 3.5–5.1)
Sodium: 139 mEq/L (ref 135–145)

## 2021-01-24 LAB — TSH: TSH: 1.76 u[IU]/mL (ref 0.35–4.50)

## 2021-01-24 NOTE — Assessment & Plan Note (Signed)
Pt's PE WNL w/ exception of obesity.  UTD on colonoscopy, COVID.  Tdap done today.  Pt to schedule mammo.  Check labs.  Anticipatory guidance provided.

## 2021-01-24 NOTE — Progress Notes (Signed)
   Subjective:    Patient ID: Laura Patrick, female    DOB: 1956-02-10, 65 y.o.   MRN: [id number]  HPI CPE- UTD on colonoscopy.  Due for mammo.  Due for Prevnar, Tdap.  UTD on COVID  Reviewed past medical, surgical, family and social histories.   Health Maintenance  Topic Date Due   Zoster Vaccines- Shingrix (1 of 2) Never done   DEXA SCAN  Never done   PNA vac Low Risk Adult (1 of 2 - PCV13) Never done   MAMMOGRAM  05/21/2019   Hepatitis C Screening  01/24/2022 (Originally 11/14/1973)   INFLUENZA VACCINE  03/04/2021   COVID-19 Vaccine (4 - Booster for Moderna series) 04/16/2021   TETANUS/TDAP  10/14/2021   COLONOSCOPY (Pts 45-66yrs Insurance coverage will need to be confirmed)  09/04/2027   HPV VACCINES  Aged Out      Review of Systems Patient reports no vision/ hearing changes, adenopathy,fever, weight change,  persistant/recurrent hoarseness , swallowing issues, chest pain, palpitations, edema, persistant/recurrent cough, hemoptysis, dyspnea (rest/exertional/paroxysmal nocturnal), gastrointestinal bleeding (melena, rectal bleeding), abdominal pain, significant heartburn, bowel changes, GU symptoms (dysuria, hematuria, incontinence), Gyn symptoms (abnormal  bleeding, pain),  syncope, focal weakness, memory loss, numbness & tingling, skin/hair/nail changes, abnormal bruising or bleeding, anxiety, or depression.   This visit occurred during the SARS-CoV-2 public health emergency.  Safety protocols were in place, including screening questions prior to the visit, additional usage of staff PPE, and extensive cleaning of exam room while observing appropriate contact time as indicated for disinfecting solutions.      Objective:   Physical Exam General Appearance:    Alert, cooperative, no distress, appears stated age, obese  Head:    Normocephalic, without obvious abnormality, atraumatic  Eyes:    PERRL, conjunctiva/corneas clear, EOM's intact, fundi    benign, both eyes  Ears:     Normal TM's and external ear canals, both ears  Nose:   Deferred due to COVID  Throat:   Neck:   Supple, symmetrical, trachea midline, no adenopathy;    Thyroid: no enlargement/tenderness/nodules  Back:     Symmetric, no curvature, ROM normal, no CVA tenderness  Lungs:     Clear to auscultation bilaterally, respirations unlabored  Chest Wall:    No tenderness or deformity   Heart:    Regular rate and rhythm, S1 and S2 normal, no murmur, rub   or gallop  Breast Exam:    Deferred to mammo  Abdomen:     Soft, non-tender, bowel sounds active all four quadrants,    no masses, no organomegaly  Genitalia:    Deferred  Rectal:    Extremities:   Extremities normal, atraumatic, no cyanosis or edema  Pulses:   2+ and symmetric all extremities  Skin:   Skin color, texture, turgor normal, no rashes or lesions  Lymph nodes:   Cervical, supraclavicular, and axillary nodes normal  Neurologic:   CNII-XII intact, normal strength, sensation and reflexes    throughout          Assessment & Plan:

## 2021-01-24 NOTE — Assessment & Plan Note (Signed)
Ongoing issue for pt.  BMI is 37 but given other medical issues qualifies as morbidly obese.  Encouraged healthy diet and regular exercise.  Will check labs to risk stratify.

## 2021-01-24 NOTE — Assessment & Plan Note (Signed)
Check labs and replete prn. 

## 2021-01-24 NOTE — Patient Instructions (Signed)
Follow up in 6 months to recheck BP and weight loss progress We'll notify you of your lab results and make any changes if needed Continue to work on healthy diet and regular exercise- you can do it!

## 2021-01-25 ENCOUNTER — Other Ambulatory Visit (HOSPITAL_BASED_OUTPATIENT_CLINIC_OR_DEPARTMENT_OTHER): Payer: Self-pay

## 2021-01-25 ENCOUNTER — Other Ambulatory Visit (HOSPITAL_COMMUNITY): Payer: Self-pay

## 2021-01-25 ENCOUNTER — Other Ambulatory Visit: Payer: Self-pay

## 2021-01-25 MED ORDER — VITAMIN D (ERGOCALCIFEROL) 1.25 MG (50000 UNIT) PO CAPS
50000.0000 [IU] | ORAL_CAPSULE | ORAL | 0 refills | Status: DC
  Filled 2021-01-25: qty 12, 84d supply, fill #0

## 2021-01-25 MED ORDER — VITAMIN D (ERGOCALCIFEROL) 1.25 MG (50000 UNIT) PO CAPS
50000.0000 [IU] | ORAL_CAPSULE | ORAL | 0 refills | Status: AC
  Filled 2021-01-25 – 2021-01-28 (×3): qty 12, 84d supply, fill #0

## 2021-01-28 ENCOUNTER — Other Ambulatory Visit (HOSPITAL_BASED_OUTPATIENT_CLINIC_OR_DEPARTMENT_OTHER): Payer: Self-pay

## 2021-01-28 ENCOUNTER — Other Ambulatory Visit (HOSPITAL_COMMUNITY): Payer: Self-pay

## 2021-01-28 ENCOUNTER — Other Ambulatory Visit: Payer: Self-pay | Admitting: Family Medicine

## 2021-01-28 DIAGNOSIS — I10 Essential (primary) hypertension: Secondary | ICD-10-CM

## 2021-01-28 MED ORDER — POTASSIUM CHLORIDE CRYS ER 20 MEQ PO TBCR
EXTENDED_RELEASE_TABLET | Freq: Every day | ORAL | 0 refills | Status: DC
  Filled 2021-01-28: qty 90, 90d supply, fill #0

## 2021-01-28 MED ORDER — AMLODIPINE BESYLATE 10 MG PO TABS
ORAL_TABLET | Freq: Every day | ORAL | 0 refills | Status: DC
  Filled 2021-01-28: qty 90, 90d supply, fill #0

## 2021-01-28 MED ORDER — VALSARTAN-HYDROCHLOROTHIAZIDE 320-12.5 MG PO TABS
1.0000 | ORAL_TABLET | Freq: Every day | ORAL | 0 refills | Status: DC
  Filled 2021-01-28: qty 90, 90d supply, fill #0

## 2021-01-29 ENCOUNTER — Ambulatory Visit (HOSPITAL_COMMUNITY)
Admission: RE | Admit: 2021-01-29 | Discharge: 2021-01-29 | Disposition: A | Discharge status: Home / Self Care | Payer: 59 | Source: Ambulatory Visit | Attending: Family Medicine | Admitting: Family Medicine

## 2021-01-29 ENCOUNTER — Other Ambulatory Visit (HOSPITAL_BASED_OUTPATIENT_CLINIC_OR_DEPARTMENT_OTHER): Payer: Self-pay

## 2021-01-29 ENCOUNTER — Other Ambulatory Visit: Payer: Self-pay

## 2021-01-29 DIAGNOSIS — I10 Essential (primary) hypertension: Secondary | ICD-10-CM | POA: Insufficient documentation

## 2021-01-29 DIAGNOSIS — H3509 Other intraretinal microvascular abnormalities: Secondary | ICD-10-CM | POA: Insufficient documentation

## 2021-01-29 DIAGNOSIS — I6523 Occlusion and stenosis of bilateral carotid arteries: Secondary | ICD-10-CM | POA: Diagnosis not present

## 2021-01-30 ENCOUNTER — Encounter: Payer: Self-pay | Admitting: *Deleted

## 2021-01-30 ENCOUNTER — Other Ambulatory Visit (HOSPITAL_BASED_OUTPATIENT_CLINIC_OR_DEPARTMENT_OTHER): Payer: Self-pay

## 2021-02-03 ENCOUNTER — Encounter: Payer: Self-pay | Admitting: Family Medicine

## 2021-05-03 ENCOUNTER — Other Ambulatory Visit (HOSPITAL_BASED_OUTPATIENT_CLINIC_OR_DEPARTMENT_OTHER): Payer: Self-pay

## 2021-05-03 ENCOUNTER — Ambulatory Visit: Payer: 59 | Attending: Internal Medicine

## 2021-05-03 DIAGNOSIS — Z23 Encounter for immunization: Secondary | ICD-10-CM

## 2021-05-03 MED ORDER — INFLUENZA VAC A&B SA ADJ QUAD 0.5 ML IM PRSY
PREFILLED_SYRINGE | INTRAMUSCULAR | 0 refills | Status: DC
  Filled 2021-05-03: qty 0.5, 1d supply, fill #0

## 2021-05-03 NOTE — Progress Notes (Signed)
   Covid-19 Vaccination Clinic  Name:  DESTONY PREVOST    MRN: [id number] DOB: January 03, 1956  05/03/2021  Ms. Rieth was observed post Covid-19 immunization for 15 minutes without incident. She was provided with Vaccine Information Sheet and instruction to access the V-Safe system.   Ms. Sloan was instructed to call 911 with any severe reactions post vaccine: Difficulty breathing  Swelling of face and throat  A fast heartbeat  A bad rash all over body  Dizziness and weakness

## 2021-05-14 ENCOUNTER — Other Ambulatory Visit (HOSPITAL_BASED_OUTPATIENT_CLINIC_OR_DEPARTMENT_OTHER): Payer: Self-pay

## 2021-05-14 MED ORDER — MODERNA COVID-19 BIVAL BOOSTER 50 MCG/0.5ML IM SUSP
INTRAMUSCULAR | 0 refills | Status: DC
  Filled 2021-05-14: qty 0.5, 1d supply, fill #0

## 2021-07-30 ENCOUNTER — Ambulatory Visit: Payer: 59 | Admitting: Family Medicine

## 2021-08-13 ENCOUNTER — Other Ambulatory Visit (HOSPITAL_BASED_OUTPATIENT_CLINIC_OR_DEPARTMENT_OTHER): Payer: Self-pay

## 2021-08-13 ENCOUNTER — Other Ambulatory Visit: Payer: Self-pay | Admitting: Family Medicine

## 2021-08-13 DIAGNOSIS — I10 Essential (primary) hypertension: Secondary | ICD-10-CM

## 2021-08-13 MED ORDER — AMLODIPINE BESYLATE 10 MG PO TABS
ORAL_TABLET | Freq: Every day | ORAL | 0 refills | Status: DC
  Filled 2021-08-13: qty 90, 90d supply, fill #0

## 2021-08-13 MED ORDER — POTASSIUM CHLORIDE CRYS ER 20 MEQ PO TBCR
EXTENDED_RELEASE_TABLET | Freq: Every day | ORAL | 0 refills | Status: DC
  Filled 2021-08-13: qty 90, 90d supply, fill #0

## 2021-08-13 MED ORDER — VALSARTAN-HYDROCHLOROTHIAZIDE 320-12.5 MG PO TABS
1.0000 | ORAL_TABLET | Freq: Every day | ORAL | 0 refills | Status: DC
  Filled 2021-08-13: qty 90, 90d supply, fill #0

## 2021-08-14 ENCOUNTER — Other Ambulatory Visit (HOSPITAL_BASED_OUTPATIENT_CLINIC_OR_DEPARTMENT_OTHER): Payer: Self-pay

## 2021-08-16 ENCOUNTER — Other Ambulatory Visit (HOSPITAL_BASED_OUTPATIENT_CLINIC_OR_DEPARTMENT_OTHER): Payer: Self-pay

## 2021-08-30 ENCOUNTER — Ambulatory Visit: Payer: 59 | Admitting: Family Medicine

## 2021-09-03 ENCOUNTER — Ambulatory Visit: Payer: 59 | Admitting: Family Medicine

## 2021-09-13 ENCOUNTER — Encounter: Payer: Self-pay | Admitting: Family Medicine

## 2021-09-13 ENCOUNTER — Ambulatory Visit: Payer: 59 | Admitting: Family Medicine

## 2021-09-13 VITALS — BP 132/82 | HR 68 | Temp 98.1°F | Resp 16 | Wt 188.8 lb

## 2021-09-13 DIAGNOSIS — I10 Essential (primary) hypertension: Secondary | ICD-10-CM | POA: Diagnosis not present

## 2021-09-13 DIAGNOSIS — Z1231 Encounter for screening mammogram for malignant neoplasm of breast: Secondary | ICD-10-CM

## 2021-09-13 DIAGNOSIS — E785 Hyperlipidemia, unspecified: Secondary | ICD-10-CM

## 2021-09-13 DIAGNOSIS — Z780 Asymptomatic menopausal state: Secondary | ICD-10-CM

## 2021-09-13 LAB — CBC WITH DIFFERENTIAL/PLATELET
Basophils Absolute: 0 10*3/uL (ref 0.0–0.1)
Basophils Relative: 0.8 % (ref 0.0–3.0)
Eosinophils Absolute: 0.1 10*3/uL (ref 0.0–0.7)
Eosinophils Relative: 1.6 % (ref 0.0–5.0)
HCT: 40.9 % (ref 36.0–46.0)
Hemoglobin: 13.4 g/dL (ref 12.0–15.0)
Lymphocytes Relative: 35.2 % (ref 12.0–46.0)
Lymphs Abs: 1.4 10*3/uL (ref 0.7–4.0)
MCHC: 32.7 g/dL (ref 30.0–36.0)
MCV: 84.7 fl (ref 78.0–100.0)
Monocytes Absolute: 0.4 10*3/uL (ref 0.1–1.0)
Monocytes Relative: 8.7 % (ref 3.0–12.0)
Neutro Abs: 2.2 10*3/uL (ref 1.4–7.7)
Neutrophils Relative %: 53.7 % (ref 43.0–77.0)
Platelets: 300 10*3/uL (ref 150.0–400.0)
RBC: 4.83 Mil/uL (ref 3.87–5.11)
RDW: 13.6 % (ref 11.5–15.5)
WBC: 4.1 10*3/uL (ref 4.0–10.5)

## 2021-09-13 LAB — HEPATIC FUNCTION PANEL
ALT: 15 U/L (ref 0–35)
AST: 13 U/L (ref 0–37)
Albumin: 4.7 g/dL (ref 3.5–5.2)
Alkaline Phosphatase: 71 U/L (ref 39–117)
Bilirubin, Direct: 0.1 mg/dL (ref 0.0–0.3)
Total Bilirubin: 0.5 mg/dL (ref 0.2–1.2)
Total Protein: 7.4 g/dL (ref 6.0–8.3)

## 2021-09-13 LAB — LIPID PANEL
Cholesterol: 154 mg/dL (ref 0–200)
HDL: 55.6 mg/dL (ref >39.00)
LDL Cholesterol: 77 mg/dL (ref 0–99)
NonHDL: 98.28
Total CHOL/HDL Ratio: 3
Triglycerides: 106 mg/dL (ref 0.0–149.0)
VLDL: 21.2 mg/dL (ref 0.0–40.0)

## 2021-09-13 LAB — TSH: TSH: 1.97 u[IU]/mL (ref 0.35–5.50)

## 2021-09-13 LAB — BASIC METABOLIC PANEL
BUN: 14 mg/dL (ref 6–23)
CO2: 30 mEq/L (ref 19–32)
Calcium: 10.2 mg/dL (ref 8.4–10.5)
Chloride: 103 mEq/L (ref 96–112)
Creatinine, Ser: 0.87 mg/dL (ref 0.40–1.20)
GFR: 68.74 mL/min (ref >60.00)
Glucose, Bld: 99 mg/dL (ref 70–99)
Potassium: 4.1 mEq/L (ref 3.5–5.1)
Sodium: 139 mEq/L (ref 135–145)

## 2021-09-13 NOTE — Assessment & Plan Note (Signed)
Pt has hx of this.  Recently has been able to control w/ healthy diet and regular exercise.  Check labs to determine if meds are needed.  Will follow.

## 2021-09-13 NOTE — Progress Notes (Signed)
° °  Subjective:    Patient ID: Laura Patrick, female    DOB: 02-05-1956, 66 y.o.   MRN: [id number]  HPI HTN- chronic problem, on Amlodipine 10mg  daily, Valsartan HCTZ 320/12.5mg  daily w/ adequate control.  Denies CP, SOB, HAs, visual changes, edema.    Hyperlipidemia- attempting to control w/ diet and exercise.  Last LDL 96.  Obesity- ongoing issue for pt.  Is attempting to walk ~10 minutes/day at work.  Is trying to take the stairs.  No formal exercise.  Weight is stable.  BMI 37.49.   Review of Systems For ROS see HPI   This visit occurred during the SARS-CoV-2 public health emergency.  Safety protocols were in place, including screening questions prior to the visit, additional usage of staff PPE, and extensive cleaning of exam room while observing appropriate contact time as indicated for disinfecting solutions.      Objective:   Physical Exam Vitals reviewed.  Constitutional:      General: She is not in acute distress.    Appearance: Normal appearance. She is well-developed. She is obese. She is not ill-appearing.  HENT:     Head: Normocephalic and atraumatic.  Eyes:     Conjunctiva/sclera: Conjunctivae normal.     Pupils: Pupils are equal, round, and reactive to light.  Neck:     Thyroid: No thyromegaly.  Cardiovascular:     Rate and Rhythm: Normal rate and regular rhythm.     Pulses: Normal pulses.     Heart sounds: Normal heart sounds. No murmur heard. Pulmonary:     Effort: Pulmonary effort is normal. No respiratory distress.     Breath sounds: Normal breath sounds.  Abdominal:     General: There is no distension.     Palpations: Abdomen is soft.     Tenderness: There is no abdominal tenderness.  Musculoskeletal:     Cervical back: Normal range of motion and neck supple.     Right lower leg: No edema.     Left lower leg: No edema.  Lymphadenopathy:     Cervical: No cervical adenopathy.  Skin:    General: Skin is warm and dry.  Neurological:     Mental  Status: She is alert and oriented to person, place, and time.  Psychiatric:        Behavior: Behavior normal.          Assessment & Plan:

## 2021-09-13 NOTE — Assessment & Plan Note (Signed)
Ongoing issue for pt.  BMI 37.49 but coupled w/ HTN and hyperlipidemia this qualifies as morbidly obese.  Encouraged low carb diet and regular exercise.  Will follow.

## 2021-09-13 NOTE — Assessment & Plan Note (Signed)
Chronic problem.  Well controlled today on Amlodipine and Valsartan HCTZ.  Currently asymptomatic.  Check labs due to ARB and diuretic.  No anticipated med changes.

## 2021-09-13 NOTE — Patient Instructions (Addendum)
Schedule your complete physical in 6 months We'll notify you of your lab results and make any changes if needed Continue to work on healthy diet and regular exercise- you can do it!! Schedule your mammo (and bone density) at your convenience.  Or now. Call with any questions or concerns Stay Safe!  Stay Healthy! HAPPY Piacentini'S DAY!!!

## 2021-09-17 ENCOUNTER — Telehealth: Payer: Self-pay

## 2021-09-17 NOTE — Telephone Encounter (Signed)
-----   Message from Midge Minium, MD sent at 09/15/2021  5:13 PM EST ----- Labs look great!  No changes at this time

## 2021-09-18 NOTE — Telephone Encounter (Signed)
Pt aware of labs  

## 2021-09-20 ENCOUNTER — Other Ambulatory Visit: Payer: Self-pay

## 2021-09-20 ENCOUNTER — Ambulatory Visit (HOSPITAL_COMMUNITY)
Admission: RE | Admit: 2021-09-20 | Discharge: 2021-09-20 | Disposition: A | Discharge status: Home / Self Care | Payer: 59 | Source: Ambulatory Visit | Attending: Family Medicine | Admitting: Family Medicine

## 2021-09-20 DIAGNOSIS — Z780 Asymptomatic menopausal state: Secondary | ICD-10-CM | POA: Diagnosis not present

## 2021-09-20 DIAGNOSIS — Z1231 Encounter for screening mammogram for malignant neoplasm of breast: Secondary | ICD-10-CM | POA: Diagnosis not present

## 2021-09-23 ENCOUNTER — Other Ambulatory Visit (HOSPITAL_COMMUNITY): Payer: Self-pay | Admitting: Family Medicine

## 2021-09-23 DIAGNOSIS — R928 Other abnormal and inconclusive findings on diagnostic imaging of breast: Secondary | ICD-10-CM

## 2021-10-01 ENCOUNTER — Other Ambulatory Visit: Payer: Self-pay

## 2021-10-01 ENCOUNTER — Other Ambulatory Visit (HOSPITAL_COMMUNITY): Payer: Self-pay | Admitting: Family Medicine

## 2021-10-01 ENCOUNTER — Ambulatory Visit (HOSPITAL_COMMUNITY)
Admission: RE | Admit: 2021-10-01 | Discharge: 2021-10-01 | Disposition: A | Discharge status: Home / Self Care | Payer: 59 | Source: Ambulatory Visit | Attending: Family Medicine | Admitting: Family Medicine

## 2021-10-01 DIAGNOSIS — R928 Other abnormal and inconclusive findings on diagnostic imaging of breast: Secondary | ICD-10-CM

## 2021-10-01 DIAGNOSIS — N6315 Unspecified lump in the right breast, overlapping quadrants: Secondary | ICD-10-CM | POA: Diagnosis not present

## 2021-10-01 DIAGNOSIS — R922 Inconclusive mammogram: Secondary | ICD-10-CM | POA: Diagnosis not present

## 2021-10-02 DIAGNOSIS — C801 Malignant (primary) neoplasm, unspecified: Secondary | ICD-10-CM

## 2021-10-02 HISTORY — DX: Malignant (primary) neoplasm, unspecified: C80.1

## 2021-10-08 ENCOUNTER — Other Ambulatory Visit: Payer: Self-pay | Admitting: Diagnostic Radiology

## 2021-10-08 ENCOUNTER — Ambulatory Visit (HOSPITAL_COMMUNITY)
Admission: RE | Admit: 2021-10-08 | Discharge: 2021-10-08 | Disposition: A | Discharge status: Home / Self Care | Payer: 59 | Source: Ambulatory Visit | Attending: Family Medicine | Admitting: Family Medicine

## 2021-10-08 ENCOUNTER — Other Ambulatory Visit (HOSPITAL_COMMUNITY): Payer: Self-pay | Admitting: Family Medicine

## 2021-10-08 ENCOUNTER — Other Ambulatory Visit: Payer: Self-pay

## 2021-10-08 ENCOUNTER — Encounter (HOSPITAL_COMMUNITY): Payer: Self-pay

## 2021-10-08 DIAGNOSIS — C50212 Malignant neoplasm of upper-inner quadrant of left female breast: Secondary | ICD-10-CM | POA: Diagnosis not present

## 2021-10-08 DIAGNOSIS — N6489 Other specified disorders of breast: Secondary | ICD-10-CM | POA: Diagnosis not present

## 2021-10-08 DIAGNOSIS — C50812 Malignant neoplasm of overlapping sites of left female breast: Secondary | ICD-10-CM | POA: Diagnosis not present

## 2021-10-08 DIAGNOSIS — R928 Other abnormal and inconclusive findings on diagnostic imaging of breast: Secondary | ICD-10-CM | POA: Insufficient documentation

## 2021-10-08 DIAGNOSIS — D242 Benign neoplasm of left breast: Secondary | ICD-10-CM | POA: Diagnosis not present

## 2021-10-08 DIAGNOSIS — R921 Mammographic calcification found on diagnostic imaging of breast: Secondary | ICD-10-CM | POA: Diagnosis not present

## 2021-10-08 DIAGNOSIS — N6012 Diffuse cystic mastopathy of left breast: Secondary | ICD-10-CM | POA: Diagnosis not present

## 2021-10-08 MED ORDER — LIDOCAINE-EPINEPHRINE 1 %-1:200000 IJ SOLN
INTRAMUSCULAR | Status: AC
Start: 2021-10-08 — End: 2021-10-08
  Administered 2021-10-08: 10 mL
  Filled 2021-10-08: qty 30

## 2021-10-08 MED ORDER — LIDOCAINE HCL (PF) 2 % IJ SOLN
INTRAMUSCULAR | Status: AC
  Administered 2021-10-08: 10 mL
  Filled 2021-10-08: qty 20

## 2021-10-11 ENCOUNTER — Encounter: Payer: Self-pay | Admitting: Family Medicine

## 2021-10-11 ENCOUNTER — Encounter: Payer: Self-pay | Admitting: *Deleted

## 2021-10-11 DIAGNOSIS — C50919 Malignant neoplasm of unspecified site of unspecified female breast: Secondary | ICD-10-CM

## 2021-10-11 NOTE — Progress Notes (Signed)
Reached out to Holley Dexter to introduce myself as the office RN Navigator and explain our new patient process. Reviewed the reason for their referral and scheduled their new patient appointment along with labs. Provided address and directions to the office including call back phone number. Reviewed with patient any concerns they may have or any possible barriers to attending their appointment.  ? ?Informed patient about my role as a navigator and that I will meet with them prior to their New Patient appointment and more fully discuss what services I can provide. At this time patient has no further questions or needs.   ? ?Oncology Nurse Navigator Documentation ? ?Oncology Nurse Navigator Flowsheets 10/11/2021  ?Abnormal Finding Date 09/20/2021  ?Confirmed Diagnosis Date 10/08/2021  ?Diagnosis Status Additional Work Up  ?Navigator Follow Up Date: 10/18/2021  ?Navigator Follow Up Reason: New Patient Appointment  ?Navigator Location [facility]  ?Referral Date to RadOnc/MedOnc 10/11/2021  ?Navigator Encounter Type Introductory Phone Call  ?Treatment Phase Pre-Tx/Tx Discussion  ?Barriers/Navigation Needs Coordination of Care;Education  ?Education Other  ?Interventions Coordination of Care;Education  ?Acuity Level 2-Minimal Needs (1-2 Barriers Identified)  ?Coordination of Care Appts  ?Education Method Verbal  ?Time Spent with Patient 45  ?  ?

## 2021-10-12 LAB — SURGICAL PATHOLOGY

## 2021-10-18 ENCOUNTER — Encounter: Payer: Self-pay | Admitting: Hematology & Oncology

## 2021-10-18 ENCOUNTER — Other Ambulatory Visit: Payer: Self-pay

## 2021-10-18 ENCOUNTER — Inpatient Hospital Stay: Payer: 59 | Admitting: Hematology & Oncology

## 2021-10-18 ENCOUNTER — Inpatient Hospital Stay: Payer: 59 | Attending: Hematology & Oncology

## 2021-10-18 ENCOUNTER — Encounter: Payer: Self-pay | Admitting: *Deleted

## 2021-10-18 VITALS — BP 134/78 | HR 60 | Temp 98.4°F | Resp 18 | Ht 60.0 in | Wt 186.0 lb

## 2021-10-18 DIAGNOSIS — Z803 Family history of malignant neoplasm of breast: Secondary | ICD-10-CM | POA: Diagnosis not present

## 2021-10-18 DIAGNOSIS — I10 Essential (primary) hypertension: Secondary | ICD-10-CM | POA: Diagnosis not present

## 2021-10-18 DIAGNOSIS — Z880 Allergy status to penicillin: Secondary | ICD-10-CM | POA: Insufficient documentation

## 2021-10-18 DIAGNOSIS — C50212 Malignant neoplasm of upper-inner quadrant of left female breast: Secondary | ICD-10-CM | POA: Insufficient documentation

## 2021-10-18 DIAGNOSIS — Z8249 Family history of ischemic heart disease and other diseases of the circulatory system: Secondary | ICD-10-CM | POA: Diagnosis not present

## 2021-10-18 DIAGNOSIS — Z91030 Bee allergy status: Secondary | ICD-10-CM | POA: Diagnosis not present

## 2021-10-18 DIAGNOSIS — Z90721 Acquired absence of ovaries, unilateral: Secondary | ICD-10-CM

## 2021-10-18 DIAGNOSIS — Z833 Family history of diabetes mellitus: Secondary | ICD-10-CM | POA: Insufficient documentation

## 2021-10-18 DIAGNOSIS — Z823 Family history of stroke: Secondary | ICD-10-CM | POA: Insufficient documentation

## 2021-10-18 DIAGNOSIS — Z170 Estrogen receptor positive status [ER+]: Secondary | ICD-10-CM | POA: Insufficient documentation

## 2021-10-18 DIAGNOSIS — Z79899 Other long term (current) drug therapy: Secondary | ICD-10-CM

## 2021-10-18 LAB — CMP (CANCER CENTER ONLY)
ALT: 10 U/L (ref 0–44)
AST: 11 U/L — ABNORMAL LOW (ref 15–41)
Albumin: 4.9 g/dL (ref 3.5–5.0)
Alkaline Phosphatase: 72 U/L (ref 38–126)
Anion gap: 8 (ref 5–15)
BUN: 12 mg/dL (ref 8–23)
CO2: 29 mmol/L (ref 22–32)
Calcium: 10.6 mg/dL — ABNORMAL HIGH (ref 8.9–10.3)
Chloride: 105 mmol/L (ref 98–111)
Creatinine: 0.94 mg/dL (ref 0.44–1.00)
GFR, Estimated: >60 mL/min (ref >60)
Glucose, Bld: 98 mg/dL (ref 70–99)
Potassium: 3.9 mmol/L (ref 3.5–5.1)
Sodium: 142 mmol/L (ref 135–145)
Total Bilirubin: 0.5 mg/dL (ref 0.3–1.2)
Total Protein: 8.7 g/dL — ABNORMAL HIGH (ref 6.5–8.1)

## 2021-10-18 LAB — CBC WITH DIFFERENTIAL (CANCER CENTER ONLY)
Abs Immature Granulocytes: 0.01 10*3/uL (ref 0.00–0.07)
Basophils Absolute: 0.1 10*3/uL (ref 0.0–0.1)
Basophils Relative: 1 %
Eosinophils Absolute: 0.1 10*3/uL (ref 0.0–0.5)
Eosinophils Relative: 1 %
HCT: 41.3 % (ref 36.0–46.0)
Hemoglobin: 13.6 g/dL (ref 12.0–15.0)
Immature Granulocytes: 0 %
Lymphocytes Relative: 45 %
Lymphs Abs: 2.2 10*3/uL (ref 0.7–4.0)
MCH: 28.1 pg (ref 26.0–34.0)
MCHC: 32.9 g/dL (ref 30.0–36.0)
MCV: 85.3 fL (ref 80.0–100.0)
Monocytes Absolute: 0.5 10*3/uL (ref 0.1–1.0)
Monocytes Relative: 10 %
Neutro Abs: 2.1 10*3/uL (ref 1.7–7.7)
Neutrophils Relative %: 43 %
Platelet Count: 312 10*3/uL (ref 150–400)
RBC: 4.84 MIL/uL (ref 3.87–5.11)
RDW: 13.3 % (ref 11.5–15.5)
WBC Count: 4.9 10*3/uL (ref 4.0–10.5)
nRBC: 0 % (ref 0.0–0.2)

## 2021-10-18 NOTE — Progress Notes (Signed)
Initial RN Navigator Patient Visit ? ?Name: Laura Patrick ?Date of Referral : 10/11/21 ?Diagnosis: Invasive ductal carcinoma of the left breast-likely stage I ? ?Met with patient prior to their visit with MD. Hanley Seamen patient "Your Patient Navigator" handout which explains my role, areas in which I am able to help, and all the contact information for myself and the office. Also gave patient MD and Navigator business card. Reviewed with patient the general overview of expected course after initial diagnosis and time frame for all steps to be completed. ? ?New patient packet given to patient which includes: orientation to office and staff; campus directory; education on My Chart and Advance Directives; and patient centered education on breast cancer. ? ?Patient completed visit with Dr. Marin Olp. ? ?She is already scheduled to see Dr Brantley Stage on 11/18/21. She will likely proceed with lumpectomy followed by radiation and AI.  ? ?She will need an oncotype score when her path results.  ? ?Message sent to genetics to assess for appropriateness of referral. She has + family history. ? ?Patient understands all follow up procedures and expectations. They have my number to reach out for any further clarification or additional needs.  ? ?Oncology Nurse Navigator Documentation ? ?Oncology Nurse Navigator Flowsheets 10/18/2021  ?Abnormal Finding Date -  ?Confirmed Diagnosis Date -  ?Diagnosis Status -  ?Planned Course of Treatment Surgery;Radiation;AI  ?Phase of Treatment Surgery  ?Surgery Pending- Reason: Surgeon or Oncologist Initiated  ?Navigator Follow Up Date: 11/18/2021  ?Navigator Follow Up Reason: Review Note  ?Navigator Location [facility]  ?Referral Date to RadOnc/MedOnc -  ?Navigator Encounter Type Initial MedOnc  ?Patient Visit Type MedOnc  ?Treatment Phase Pre-Tx/Tx Discussion  ?Barriers/Navigation Needs Coordination of Care;Education  ?Education Newly Diagnosed Cancer Education;Preparing for Upcoming Surgery/  Treatment  ?Interventions Education  ?Acuity Level 2-Minimal Needs (1-2 Barriers Identified)  ?Coordination of Care Other  ?Education Method Written  ?Support Groups/Services Friends and Family  ?Time Spent with Patient 30  ?  ?

## 2021-10-18 NOTE — Progress Notes (Signed)
Referral MD ? ?Reason for Referral: Invasive ductal carcinoma of the left breast-likely stage I ? ?Chief Complaint  ?Patient presents with  ? New Patient (Initial Visit)  ?: I have breast cancer. ? ?HPI: Laura Patrick is well-known to all of [place] in the office.  She is a very charming 66 year old African-American female.  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  She is always somewhat fun to talk to on the phone.  It is always somewhat fun seeing her in person. ? ?She has been feeling well.  She is postmenopausal.  She went through the change of life at age 44 years old.  She really had no menopausal symptoms..  She has 1 child.  He was born when she was 37 years old. ? ?She been getting routine mammograms.  She had a routine mammogram that was done in February.  This showed some distortion in the left breast.  A diagnostic mammogram was done on 10/01/2021.  This showed 3 nodules in the left breast.  The largest was I think 1.2 cm. ? ?She subsequently underwent a ultrasound guided biopsy.  This was done on 10/08/2021.  The pathology report ([id number]) showed an invasive ductal carcinoma in the nodule at the 11 o'clock position.  This was intermediate grade.  It was ER positive and PR positive.  It was HER2 negative.  The proliferation index was less than 5%. ? ?The other 2 lesions in the left breast were both negative for malignancy. ? ?There is a history of breast cancer in the family.  Some aunts have had breast cancer.  There is no immediate family members breast cancer. ? ?She does not have any problems with diabetes.  There is no heart issues.  She has had no problems with bowels or bladder. ? ?There is been no weight loss or weight gain.  There is no bony pain. ? ?She does not smoke.  She does not drink. ? ?Overall, I would say performance status is ECOG 0. ? ? ? ?Past Medical History:  ?Diagnosis Date  ? Anemia   ? Hypertension   ? Ovarian cyst   ?: ? ? ?Past Surgical  History:  ?Procedure Laterality Date  ? COLONOSCOPY    ? OOPHORECTOMY Right 1988  ? OVARIAN CYST REMOVAL    ? TOE SURGERY    ?: ? ? ?Current Outpatient Medications:  ?  amLODipine (NORVASC) 10 MG tablet, TAKE 1 TABLET BY MOUTH ONCE A DAY, Disp: 90 tablet, Rfl: 0 ?  potassium chloride SA (KLOR-CON M) 20 MEQ tablet, TAKE 1 TABLET BY MOUTH ONCE A DAY, Disp: 90 tablet, Rfl: 0 ?  valsartan-hydrochlorothiazide (DIOVAN-HCT) 320-12.5 MG tablet, TAKE 1 TABLET BY MOUTH ONCE A DAY, Disp: 90 tablet, Rfl: 0: ? ?: ? ? ?Allergies  ?Allergen Reactions  ? Bee Venom Swelling and Other (See Comments)  ?  Both elbows to the hands, swells, and red water like blisters appear.   ? Amoxicillin Rash  ?: ? ? ?Family History  ?Problem Relation Age of Onset  ? Heart disease Mother   ?     CHF  ? Diabetes Mother   ? Hypertension Father   ? Diabetes Father   ? Stroke Father   ? Breast cancer Maternal Grandmother   ? Hypertension Maternal Grandmother   ? Stroke Maternal Grandmother   ? Stroke Maternal Grandfather   ? Hypertension Maternal Grandfather   ? Breast cancer Paternal Grandmother   ?  Stroke Paternal Grandfather   ? Hypertension Paternal Grandfather   ? Colon cancer Neg Hx   ? Colon polyps Neg Hx   ? Esophageal cancer Neg Hx   ? Rectal cancer Neg Hx   ? Stomach cancer Neg Hx   ?: ? ? ?Social History  ? ?Socioeconomic History  ? Marital status: Married  ?  Spouse name: Not on file  ? Number of children: Not on file  ? Years of education: Not on file  ? Highest education level: Not on file  ?Occupational History  ? Not on file  ?Tobacco Use  ? Smoking status: Never  ? Smokeless tobacco: Never  ?Vaping Use  ? Vaping Use: Never used  ?Substance and Sexual Activity  ? Alcohol use: No  ? Drug use: No  ? Sexual activity: Not on file  ?Other Topics Concern  ? Not on file  ?Social History Narrative  ? Not on file  ? ?Social Determinants of Health  ? ?Financial Resource Strain: Not on file  ?Food Insecurity: Not on file  ?Transportation Needs: Not  on file  ?Physical Activity: Not on file  ?Stress: Not on file  ?Social Connections: Not on file  ?Intimate Partner Violence: Not on file  ?: ? ?Review of Systems  ?Constitutional: Negative.   ?HENT: Negative.    ?Eyes: Negative.   ?Respiratory: Negative.    ?Cardiovascular: Negative.   ?Gastrointestinal: Negative.   ?Genitourinary: Negative.   ?Musculoskeletal: Negative.   ?Skin: Negative.   ?Neurological: Negative.   ?Endo/Heme/Allergies: Negative.   ?Psychiatric/Behavioral: Negative.    ? ? ?Exam: ?_0 @ ?Physical Exam ?Vitals reviewed.  ?Constitutional:   ?   Comments: Breast exam shows right breast with no masses, edema or erythema.  There is no right axillary adenopathy.  The left breast does show the biopsy scar at about the 11 o'clock position.  She has some ecchymoses on the left breast.  There is no left nipple discharge.  There is no left axillary adenopathy.  ?HENT:  ?   Head: Normocephalic and atraumatic.  ?Eyes:  ?   Pupils: Pupils are equal, round, and reactive to light.  ?Cardiovascular:  ?   Rate and Rhythm: Normal rate and regular rhythm.  ?   Heart sounds: Normal heart sounds.  ?Pulmonary:  ?   Effort: Pulmonary effort is normal.  ?   Breath sounds: Normal breath sounds.  ?Abdominal:  ?   General: Bowel sounds are normal.  ?   Palpations: Abdomen is soft.  ?Musculoskeletal:     ?   General: No tenderness or deformity. Normal range of motion.  ?   Cervical back: Normal range of motion.  ?Lymphadenopathy:  ?   Cervical: No cervical adenopathy.  ?Skin: ?   General: Skin is warm and dry.  ?   Findings: No erythema or rash.  ?Neurological:  ?   Mental Status: She is alert and oriented to person, place, and time.  ?Psychiatric:     ?   Behavior: Behavior normal.     ?   Thought Content: Thought content normal.     ?   Judgment: Judgment normal.  ? ? ?Recent Labs  ?  10/18/21 ?1029  ?WBC 4.9  ?HGB 13.6  ?HCT 41.3  ?PLT 312  ? ? ?Recent Labs  ?  10/18/21 ?1029  ?NA 142  ?K 3.9  ?CL 105  ?CO2 29   ?GLUCOSE 98  ?BUN 12  ?CREATININE 0.94  ?CALCIUM 10.6*  ? ? ?  Blood smear review: None ? ?Pathology: See above ? ? ? ?Assessment and Plan: Laura Patrick is a very charming 66 year old postmenopausal Afro-American female.  She has a ductal carcinoma of the left breast.  I would have to think that this is going to be stage I bile the information we have so far. ? ?She has not seen surgery yet.  We will see about having her see surgery in a couple weeks. ? ?I think she would be doing okay with a lumpectomy.  I would think that there is to be negative axillary lymph nodes just from the pathology that we have back. ? ?We will need to send the pathology specimen off for Oncotype assay.  Again I would think that her Oncotype score should be less than 15. ? ?I told her that if she has a lumpectomy, she will need radiation therapy.  I think that nowadays, radiation is not as extensive as it used to be with early stage breast cancer. ? ?I would think that all she was going to need would be an aromatase inhibitor.  Again, the Oncotype score will help [place] out. ? ?At this point, we probably will plan to see her back in a couple months.  By then, she should have had her surgery and we will know what the Oncotype score is. ? ?I know the outcome here is going to be excellent.  I would think that, again, given the information that we have, that her risk of recurrence of this malignancy should be easily less than 10%. ?  ?

## 2021-10-22 ENCOUNTER — Encounter: Payer: Self-pay | Admitting: *Deleted

## 2021-10-22 ENCOUNTER — Encounter (HOSPITAL_COMMUNITY): Payer: 59

## 2021-10-22 ENCOUNTER — Ambulatory Visit (HOSPITAL_COMMUNITY): Payer: 59

## 2021-10-24 ENCOUNTER — Encounter: Payer: Self-pay | Admitting: *Deleted

## 2021-10-24 NOTE — Progress Notes (Signed)
Patient has not yet read her MyChart message regarding the genetic referral. Called and left message on patient's voicemail informing her of the qualification for genetics and to determine her interest.  ? ?Oncology Nurse Navigator Documentation ? ? ?  10/24/2021  ?  1:30 PM  ?Oncology Nurse Navigator Flowsheets  ?Navigator Follow Up Date: 11/18/2021  ?Navigator Follow Up Reason: Review Note  ?Navigator Location [facility]  ?Navigator Encounter Type Telephone  ?Telephone Education  ?Patient Visit Type MedOnc  ?Treatment Phase Pre-Tx/Tx Discussion  ?Barriers/Navigation Needs Coordination of Care;Education  ?Education Other  ?Interventions Education  ?Acuity Level 2-Minimal Needs (1-2 Barriers Identified)  ?Education Method Verbal  ?Support Groups/Services Friends and Family  ?Time Spent with Patient 15  ?  ? ?

## 2021-10-29 ENCOUNTER — Ambulatory Visit: Payer: Self-pay | Admitting: Surgery

## 2021-10-29 DIAGNOSIS — C50112 Malignant neoplasm of central portion of left female breast: Secondary | ICD-10-CM | POA: Diagnosis not present

## 2021-10-29 DIAGNOSIS — Z170 Estrogen receptor positive status [ER+]: Secondary | ICD-10-CM | POA: Diagnosis not present

## 2021-10-29 DIAGNOSIS — C50912 Malignant neoplasm of unspecified site of left female breast: Secondary | ICD-10-CM

## 2021-10-30 ENCOUNTER — Other Ambulatory Visit: Payer: Self-pay | Admitting: *Deleted

## 2021-10-30 ENCOUNTER — Encounter: Payer: Self-pay | Admitting: *Deleted

## 2021-10-30 DIAGNOSIS — Z170 Estrogen receptor positive status [ER+]: Secondary | ICD-10-CM

## 2021-10-30 NOTE — Progress Notes (Signed)
This pt is scheduled for sx on 4/26 at 1:00 pm at [facility].  ? ?LT BR RSL(BCG) LUMP X3 & SLNB  ? ?Oncology Nurse Navigator Documentation ? ? ?  10/30/2021  ?  2:00 PM  ?Oncology Nurse Navigator Flowsheets  ?Navigator Follow Up Date: 11/27/2021  ?Navigator Follow Up Reason: Surgery  ?Navigator Location [facility]  ?Navigator Encounter Type Appt/Treatment Plan Review  ?Patient Visit Type MedOnc  ?Treatment Phase Pre-Tx/Tx Discussion  ?Barriers/Navigation Needs Coordination of Care;Education  ?Interventions None Required  ?Acuity Level 2-Minimal Needs (1-2 Barriers Identified)  ?Support Groups/Services Friends and Family  ?Time Spent with Patient 15  ?  ?

## 2021-10-31 ENCOUNTER — Ambulatory Visit: Payer: Self-pay | Admitting: Surgery

## 2021-11-01 ENCOUNTER — Encounter: Payer: Self-pay | Admitting: *Deleted

## 2021-11-01 ENCOUNTER — Other Ambulatory Visit: Payer: Self-pay | Admitting: Surgery

## 2021-11-01 DIAGNOSIS — C50912 Malignant neoplasm of unspecified site of left female breast: Secondary | ICD-10-CM

## 2021-11-01 NOTE — Progress Notes (Signed)
Followed up with patient regarding a referral to genetics. At this time she doesn't want genetic testing. She states we can revisit after surgery.  ? ?Oncology Nurse Navigator Documentation ? ? ?  11/01/2021  ? 10:00 AM  ?Oncology Nurse Navigator Flowsheets  ?Navigator Follow Up Date: 11/27/2021  ?Navigator Follow Up Reason: Surgery  ?Navigator Location [facility]  ?Navigator Encounter Type Other:  ?Telephone Patient Update  ?Patient Visit Type MedOnc  ?Treatment Phase Pre-Tx/Tx Discussion  ?Barriers/Navigation Needs Coordination of Care;Education  ?Education Other  ?Interventions Education  ?Acuity Level 2-Minimal Needs (1-2 Barriers Identified)  ?Education Method Verbal  ?Support Groups/Services Friends and Family  ?Time Spent with Patient 15  ?  ?

## 2021-11-05 ENCOUNTER — Ambulatory Visit: Payer: 59 | Attending: Surgery | Admitting: Physical Therapy

## 2021-11-05 ENCOUNTER — Encounter: Payer: Self-pay | Admitting: Physical Therapy

## 2021-11-05 DIAGNOSIS — R293 Abnormal posture: Secondary | ICD-10-CM | POA: Diagnosis not present

## 2021-11-05 DIAGNOSIS — C50212 Malignant neoplasm of upper-inner quadrant of left female breast: Secondary | ICD-10-CM | POA: Diagnosis not present

## 2021-11-05 DIAGNOSIS — Z170 Estrogen receptor positive status [ER+]: Secondary | ICD-10-CM | POA: Insufficient documentation

## 2021-11-05 NOTE — Therapy (Signed)
?OUTPATIENT PHYSICAL THERAPY BREAST CANCER BASELINE EVALUATION ? ? ?Patient Name: Laura Patrick ?MRN: [id number] ?DOB:May 09, 1956, 66 y.o., female ?Today's Date: 11/05/2021 ? ? PT End of Session - 11/05/21 0851   ? ? Visit Number 1   ? Number of Visits 2   ? Date for PT Re-Evaluation 12/31/21   ? PT Start Time [phone]   ? PT Stop Time 0840   ? PT Time Calculation (min) 34 min   ? Activity Tolerance Patient tolerated treatment well   ? Behavior During Therapy [facility] for tasks assessed/performed   ? ?  ?  ? ?  ? ? ?Past Medical History:  ?Diagnosis Date  ? Anemia   ? Hypertension   ? Ovarian cyst   ? ?Past Surgical History:  ?Procedure Laterality Date  ? COLONOSCOPY    ? OOPHORECTOMY Right 1988  ? OVARIAN CYST REMOVAL    ? TOE SURGERY    ? ?Patient Active Problem List  ? Diagnosis Date Noted  ? Retinal arteriolar sclerosis 11/13/2020  ? Vitamin D deficiency 12/28/2019  ? History of vitamin D deficiency 05/29/2017  ? Physical exam 08/10/2014  ? Encounter for Papanicolaou smear of cervix 08/10/2014  ? Morbid obesity ([place]) 10/14/2013  ? Pain in limb 05/23/2010  ? PES PLANUS 05/23/2010  ? PARESTHESIA 05/23/2010  ? HYPOPOTASSEMIA 08/30/2008  ? Hyperlipidemia 04/12/2007  ? SYNDROME, CARPAL TUNNEL 04/12/2007  ? Essential hypertension 04/12/2007  ? ? ?PCP: Midge Minium, MD ? ?REFERRING PROVIDER: Erroll Luna, MD ? ?REFERRING DIAG: C50.212,Z17.0 (ICD-10-CM) - Malignant neoplasm of upper-inner quadrant of left breast in female, estrogen receptor positive ([place])  ? ?THERAPY DIAG:  ?Abnormal posture ? ?Malignant neoplasm of upper-inner quadrant of left breast in female, estrogen receptor positive ([place]) ? ?ONSET DATE: 10/11/21 ? ?SUBJECTIVE                                                                                                                                                                                          ? ?SUBJECTIVE STATEMENT: ?Patient reports she is here today to be seen by her medical team for her newly  diagnosed left breast cancer.  ? ?PERTINENT HISTORY:  ?Patient was diagnosed on 10/11/21 with left grade 2. It measures 0.2 cm and is located in the upper outer quadrant. It is ER/PR+, HER2- with a Ki67 of 5%.  ? ?PATIENT GOALS   reduce lymphedema risk and learn post op HEP.  ? ?PAIN:  ?Are you having pain? No ? ? ?PRECAUTIONS: Active CA  ? ?HAND DOMINANCE: right ? ?WEIGHT BEARING RESTRICTIONS No ? ?FALLS:  ?Has patient fallen in last 6 months? No ? ?  LIVING ENVIRONMENT: ?Patient lives with: husband, Louie Casa ?Lives in: House/apartment ?Has following equipment at home: None ? ?OCCUPATION: scheduler for [employer] ? ?LEISURE: pt does not currently exercise ? ?PRIOR LEVEL OF FUNCTION: Independent ? ? ?OBJECTIVE ? ?COGNITION: ? Overall cognitive status: Within functional limits for tasks assessed   ? ?POSTURE:  ?Forward head and rounded shoulders posture ? ?UPPER EXTREMITY AROM/PROM: ? ?A/PROM RIGHT  11/05/2021 ?  ?Shoulder extension 68  ?Shoulder flexion 164  ?Shoulder abduction 165  ?Shoulder internal rotation 54  ?Shoulder external rotation 95  ?  (Blank rows = not tested) ? ?A/PROM LEFT  11/05/2021  ?Shoulder extension 59  ?Shoulder flexion 163  ?Shoulder abduction 174  ?Shoulder internal rotation 67  ?Shoulder external rotation 72  ?  (Blank rows = not tested) ? ? ?CERVICAL AROM: ?All within normal limits:  ? ? Percent limited  ?Flexion WFL  ?Extension WFL  ?Right lateral flexion WFL  ?Left lateral flexion WFL  ?Right rotation WFL  ?Left rotation WFL  ? ? ? ?UPPER EXTREMITY STRENGTH: 5/5 ? ? ?LYMPHEDEMA ASSESSMENTS:  ? ?LANDMARK RIGHT  11/05/2021  ?10 cm proximal to olecranon process 33.8  ?Olecranon process 27.5  ?10 cm proximal to ulnar styloid process 23.5  ?Just proximal to ulnar styloid process 16.7  ?Across hand at thumb web space 20.3  ?At base of 2nd digit 6.6  ?(Blank rows = not tested) ? ?LANDMARK LEFT  11/05/2021  ?10 cm proximal to olecranon process 34.5  ?Olecranon process 27.7  ?10 cm proximal to ulnar styloid process  23  ?Just proximal to ulnar styloid process 16.5  ?Across hand at thumb web space 20.5  ?At base of 2nd digit 6.8  ?(Blank rows = not tested) ? ? ?L-DEX LYMPHEDEMA SCREENING: ? ?The patient was assessed using the L-Dex machine today to produce a lymphedema index baseline score. The patient will be reassessed on a regular basis (typically every 3 months) to obtain new L-Dex scores. If the score is > 6.5 points away from his/her baseline score indicating onset of subclinical lymphedema, it will be recommended to wear a compression garment for 4 weeks, 12 hours per day and then be reassessed. If the score continues to be > 6.5 points from baseline at reassessment, we will initiate lymphedema treatment. Assessing in this manner has a 95% rate of preventing clinically significant lymphedema. ? ? L-DEX FLOWSHEETS - 11/05/21 0800   ? ?  ? L-DEX LYMPHEDEMA SCREENING  ? Measurement Type Unilateral   ? L-DEX MEASUREMENT EXTREMITY Upper Extremity   ? POSITION  Standing   ? DOMINANT SIDE Right   ? At Risk Side Left   ? BASELINE SCORE (UNILATERAL) 0.5   ? ?  ?  ? ?  ? ? ? ?QUICK DASH SURVEY:  ? Katina Dung - 11/05/21 0001   ? ? Open a tight or new jar No difficulty   ? Do heavy household chores (wash walls, wash floors) No difficulty   ? Carry a shopping bag or briefcase No difficulty   ? Wash your back Moderate difficulty   ? Use a knife to cut food No difficulty   ? Recreational activities in which you take some force or impact through your arm, shoulder, or hand (golf, hammering, tennis) No difficulty   ? During the past week, to what extent has your arm, shoulder or hand problem interfered with your normal social activities with family, friends, neighbors, or groups? Not at all   ? During the past week,  to what extent has your arm, shoulder or hand problem limited your work or other regular daily activities Not at all   ? Arm, shoulder, or hand pain. None   ? Tingling (pins and needles) in your arm, shoulder, or hand None   ?  Difficulty Sleeping No difficulty   ? DASH Score 4.55 %   ? ?  ?  ? ?  ? ? ? ? ?PATIENT EDUCATION:  ?Education details: Lymphedema risk reduction and post op shoulder/posture HEP ?Person educated: Patient ?Education method: Explanation, Demonstration, Handout ?Education comprehension: Patient verbalized understanding and returned demonstration ? ? ?HOME EXERCISE PROGRAM: ?Patient was instructed today in a home exercise program today for post op shoulder range of motion. These included active assist shoulder flexion in sitting, scapular retraction, wall walking with shoulder abduction, and hands behind head external rotation.  She was encouraged to do these twice a day, holding 3 seconds and repeating 5 times when permitted by her physician. ? ? ?ASSESSMENT: ? ?CLINICAL IMPRESSION: ?Pt was recently diagnosed with L breast cancer. The plan is for her to undergo a L lumpectomy and SLNB on 11/27/21. She is not sure if she will require radiation. Her shoulder ROM and strength is WFL. She does report some pain in her LUE that happens during the day. She has a tendency to hike her L shoulder. Her multidisciplinary medical team met prior to her assessments to determine a recommended treatment plan. She will benefit from a post op PT reassessment to determine needs and from L-Dex screens every 3 months for 2 years to detect subclinical lymphedema. ? ?Pt will benefit from skilled therapeutic intervention to improve on the following deficits: Decreased knowledge of precautions, impaired UE functional use, pain, decreased ROM, postural dysfunction.  ? ?PT treatment/interventions: ADL/self-care home management, pt/family education, therapeutic exercise ? ?REHAB POTENTIAL: Excellent ? ?CLINICAL DECISION MAKING: Stable/uncomplicated ? ?EVALUATION COMPLEXITY: Low ? ? ?GOALS: ?Goals reviewed with patient? YES ? ?LONG TERM GOALS: (STG=LTG) ? ? Name Target Date Goal status  ?1 Pt will be able to verbalize understanding of pertinent  lymphedema risk reduction practices relevant to her dx specifically related to skin care.  ?Baseline:  No knowledge 11/05/2021 Achieved at eval  ?2 Pt will be able to return demo and/or verbalize [place]

## 2021-11-19 ENCOUNTER — Other Ambulatory Visit: Payer: Self-pay

## 2021-11-19 ENCOUNTER — Encounter (HOSPITAL_BASED_OUTPATIENT_CLINIC_OR_DEPARTMENT_OTHER): Payer: Self-pay | Admitting: Surgery

## 2021-11-19 DIAGNOSIS — C50912 Malignant neoplasm of unspecified site of left female breast: Secondary | ICD-10-CM | POA: Diagnosis not present

## 2021-11-20 ENCOUNTER — Encounter (HOSPITAL_BASED_OUTPATIENT_CLINIC_OR_DEPARTMENT_OTHER)
Admission: RE | Admit: 2021-11-20 | Discharge: 2021-11-20 | Disposition: A | Discharge status: Home / Self Care | Payer: 59 | Source: Ambulatory Visit | Attending: Surgery | Admitting: Surgery

## 2021-11-20 DIAGNOSIS — I10 Essential (primary) hypertension: Secondary | ICD-10-CM | POA: Insufficient documentation

## 2021-11-20 DIAGNOSIS — E785 Hyperlipidemia, unspecified: Secondary | ICD-10-CM | POA: Insufficient documentation

## 2021-11-20 DIAGNOSIS — Z01812 Encounter for preprocedural laboratory examination: Secondary | ICD-10-CM | POA: Insufficient documentation

## 2021-11-20 LAB — BASIC METABOLIC PANEL
Anion gap: 7 (ref 5–15)
BUN: 10 mg/dL (ref 8–23)
CO2: 25 mmol/L (ref 22–32)
Calcium: 9.5 mg/dL (ref 8.9–10.3)
Chloride: 108 mmol/L (ref 98–111)
Creatinine, Ser: 0.8 mg/dL (ref 0.44–1.00)
GFR, Estimated: >60 mL/min (ref >60)
Glucose, Bld: 92 mg/dL (ref 70–99)
Potassium: 4.2 mmol/L (ref 3.5–5.1)
Sodium: 140 mmol/L (ref 135–145)

## 2021-11-20 MED ORDER — CHLORHEXIDINE GLUCONATE CLOTH 2 % EX PADS: 6.0000 | MEDICATED_PAD | Freq: Once | CUTANEOUS | Status: DC

## 2021-11-20 NOTE — Progress Notes (Signed)
? ? ? ? ?  Enhanced Recovery after Surgery for Orthopedics ?Enhanced Recovery after Surgery is a protocol used to improve the stress on your body and your recovery after surgery. ? ?Patient Instructions ? ?The night before surgery:  ?No food after midnight. ONLY clear liquids after midnight ? ?The day of surgery (if you do NOT have diabetes):  ?Drink ONE (1) Pre-Surgery Clear Ensure as directed.   ?This drink was given to you during your hospital  ?pre-op appointment visit. ?The pre-op nurse will instruct you on the time to drink the  ?Pre-Surgery Ensure depending on your surgery time. ?Finish the drink at the designated time by the pre-op nurse.  ?Nothing else to drink after completing the  ?Pre-Surgery Clear Ensure. ? ?The day of surgery (if you have diabetes): ?Drink ONE (1) Gatorade 2 (G2) as directed. ?This drink was given to you during your hospital  ?pre-op appointment visit.  ?The pre-op nurse will instruct you on the time to drink the  ? Gatorade 2 (G2) depending on your surgery time. ?Color of the Gatorade may vary. Red is not allowed. ?Nothing else to drink after completing the  ?Gatorade 2 (G2). ? ?       If you have questions, please contact your surgeon?s office. ? ?Patient received surgical soap with instructions, patient verbalized understanding. ?

## 2021-11-26 ENCOUNTER — Ambulatory Visit: Admission: RE | Admit: 2021-11-26 | Payer: 59 | Source: Ambulatory Visit

## 2021-11-26 ENCOUNTER — Ambulatory Visit
Admission: RE | Admit: 2021-11-26 | Discharge: 2021-11-26 | Disposition: A | Discharge status: Home / Self Care | Payer: 59 | Source: Ambulatory Visit | Attending: Surgery | Admitting: Surgery

## 2021-11-26 ENCOUNTER — Ambulatory Visit
Admission: RE | Admit: 2021-11-26 | Discharge: 2021-11-26 | Disposition: A | Discharge status: Home / Self Care | Payer: 59 | Source: Ambulatory Visit

## 2021-11-26 DIAGNOSIS — C50912 Malignant neoplasm of unspecified site of left female breast: Secondary | ICD-10-CM

## 2021-11-26 DIAGNOSIS — C50212 Malignant neoplasm of upper-inner quadrant of left female breast: Secondary | ICD-10-CM | POA: Diagnosis not present

## 2021-11-27 ENCOUNTER — Ambulatory Visit
Admission: RE | Admit: 2021-11-27 | Discharge: 2021-11-27 | Disposition: A | Discharge status: Home / Self Care | Payer: 59 | Source: Ambulatory Visit | Attending: Surgery | Admitting: Surgery

## 2021-11-27 ENCOUNTER — Ambulatory Visit (HOSPITAL_BASED_OUTPATIENT_CLINIC_OR_DEPARTMENT_OTHER): Payer: 59 | Admitting: Anesthesiology

## 2021-11-27 ENCOUNTER — Ambulatory Visit (HOSPITAL_BASED_OUTPATIENT_CLINIC_OR_DEPARTMENT_OTHER)
Admission: RE | Admit: 2021-11-27 | Discharge: 2021-11-27 | Disposition: A | Discharge status: Home / Self Care | Payer: 59 | Attending: Surgery | Admitting: Surgery

## 2021-11-27 ENCOUNTER — Encounter (HOSPITAL_BASED_OUTPATIENT_CLINIC_OR_DEPARTMENT_OTHER)
Admission: RE | Disposition: A | Discharge status: Home / Self Care | Payer: Self-pay | Source: Home / Self Care | Attending: Surgery

## 2021-11-27 ENCOUNTER — Other Ambulatory Visit: Payer: Self-pay

## 2021-11-27 ENCOUNTER — Encounter (HOSPITAL_BASED_OUTPATIENT_CLINIC_OR_DEPARTMENT_OTHER): Payer: Self-pay | Admitting: Surgery

## 2021-11-27 ENCOUNTER — Other Ambulatory Visit (HOSPITAL_COMMUNITY): Payer: Self-pay

## 2021-11-27 DIAGNOSIS — C50912 Malignant neoplasm of unspecified site of left female breast: Secondary | ICD-10-CM

## 2021-11-27 DIAGNOSIS — R928 Other abnormal and inconclusive findings on diagnostic imaging of breast: Secondary | ICD-10-CM | POA: Diagnosis not present

## 2021-11-27 DIAGNOSIS — I10 Essential (primary) hypertension: Secondary | ICD-10-CM | POA: Insufficient documentation

## 2021-11-27 DIAGNOSIS — N6489 Other specified disorders of breast: Secondary | ICD-10-CM | POA: Insufficient documentation

## 2021-11-27 DIAGNOSIS — R921 Mammographic calcification found on diagnostic imaging of breast: Secondary | ICD-10-CM | POA: Diagnosis not present

## 2021-11-27 DIAGNOSIS — D242 Benign neoplasm of left breast: Secondary | ICD-10-CM | POA: Diagnosis not present

## 2021-11-27 DIAGNOSIS — N6012 Diffuse cystic mastopathy of left breast: Secondary | ICD-10-CM | POA: Diagnosis not present

## 2021-11-27 DIAGNOSIS — Z170 Estrogen receptor positive status [ER+]: Secondary | ICD-10-CM | POA: Insufficient documentation

## 2021-11-27 DIAGNOSIS — C50412 Malignant neoplasm of upper-outer quadrant of left female breast: Secondary | ICD-10-CM | POA: Insufficient documentation

## 2021-11-27 DIAGNOSIS — C773 Secondary and unspecified malignant neoplasm of axilla and upper limb lymph nodes: Secondary | ICD-10-CM | POA: Diagnosis not present

## 2021-11-27 DIAGNOSIS — G8918 Other acute postprocedural pain: Secondary | ICD-10-CM | POA: Diagnosis not present

## 2021-11-27 DIAGNOSIS — N6082 Other benign mammary dysplasias of left breast: Secondary | ICD-10-CM | POA: Diagnosis not present

## 2021-11-27 DIAGNOSIS — C50112 Malignant neoplasm of central portion of left female breast: Secondary | ICD-10-CM | POA: Diagnosis not present

## 2021-11-27 HISTORY — PX: BREAST LUMPECTOMY WITH RADIOACTIVE SEED AND SENTINEL LYMPH NODE BIOPSY: SHX6550

## 2021-11-27 SURGERY — BREAST LUMPECTOMY WITH RADIOACTIVE SEED AND SENTINEL LYMPH NODE BIOPSY
Anesthesia: General | Site: Breast | Laterality: Left

## 2021-11-27 MED ORDER — BUPIVACAINE-EPINEPHRINE (PF) 0.5% -1:200000 IJ SOLN
INTRAMUSCULAR | Status: DC | PRN
  Administered 2021-11-27: 30 mL

## 2021-11-27 MED ORDER — FENTANYL CITRATE (PF) 100 MCG/2ML IJ SOLN
INTRAMUSCULAR | Status: DC | PRN
Start: 2021-11-27 — End: 2021-11-27
  Administered 2021-11-27 (×2): 25 ug via INTRAVENOUS
  Administered 2021-11-27: 50 ug via INTRAVENOUS

## 2021-11-27 MED ORDER — GABAPENTIN 300 MG PO CAPS
ORAL_CAPSULE | ORAL | Status: AC
  Filled 2021-11-27: qty 1

## 2021-11-27 MED ORDER — OXYCODONE HCL 5 MG PO TABS
5.0000 mg | ORAL_TABLET | Freq: Four times a day (QID) | ORAL | 0 refills | Status: DC | PRN
  Filled 2021-11-27: qty 15, 4d supply, fill #0

## 2021-11-27 MED ORDER — ACETAMINOPHEN 160 MG/5ML PO SOLN: 325.0000 mg | ORAL | Status: DC | PRN

## 2021-11-27 MED ORDER — AMISULPRIDE (ANTIEMETIC) 5 MG/2ML IV SOLN: 10.0000 mg | Freq: Once | INTRAVENOUS | Status: DC | PRN

## 2021-11-27 MED ORDER — ONDANSETRON HCL 4 MG/2ML IJ SOLN
INTRAMUSCULAR | Status: AC
Start: 2021-11-27 — End: ?
  Filled 2021-11-27: qty 2

## 2021-11-27 MED ORDER — EPHEDRINE SULFATE (PRESSORS) 50 MG/ML IJ SOLN
INTRAMUSCULAR | Status: DC | PRN
  Administered 2021-11-27: 10 mg via INTRAVENOUS

## 2021-11-27 MED ORDER — SODIUM CHLORIDE 0.9 % IV SOLN
INTRAVENOUS | Status: AC
  Filled 2021-11-27 (×2): qty 10

## 2021-11-27 MED ORDER — ACETAMINOPHEN 325 MG PO TABS: 325.0000 mg | ORAL_TABLET | ORAL | Status: DC | PRN

## 2021-11-27 MED ORDER — CLINDAMYCIN PHOSPHATE 900 MG/50ML IV SOLN
900.0000 mg | INTRAVENOUS | Status: AC
  Administered 2021-11-27: 900 mg via INTRAVENOUS

## 2021-11-27 MED ORDER — MIDAZOLAM HCL 5 MG/5ML IJ SOLN
INTRAMUSCULAR | Status: DC | PRN
  Administered 2021-11-27: 2 mg via INTRAVENOUS

## 2021-11-27 MED ORDER — MAGTRACE LYMPHATIC TRACER
INTRAMUSCULAR | Status: DC | PRN
  Administered 2021-11-27: 2 mL via INTRAMUSCULAR

## 2021-11-27 MED ORDER — CLINDAMYCIN PHOSPHATE 900 MG/50ML IV SOLN
INTRAVENOUS | Status: AC
  Filled 2021-11-27: qty 50

## 2021-11-27 MED ORDER — PHENYLEPHRINE 80 MCG/ML (10ML) SYRINGE FOR IV PUSH (FOR BLOOD PRESSURE SUPPORT)
PREFILLED_SYRINGE | INTRAVENOUS | Status: AC
  Filled 2021-11-27: qty 10

## 2021-11-27 MED ORDER — LIDOCAINE 2% (20 MG/ML) 5 ML SYRINGE
INTRAMUSCULAR | Status: AC
  Filled 2021-11-27: qty 5

## 2021-11-27 MED ORDER — BUPIVACAINE-EPINEPHRINE (PF) 0.25% -1:200000 IJ SOLN
INTRAMUSCULAR | Status: AC
  Filled 2021-11-27: qty 30

## 2021-11-27 MED ORDER — OXYCODONE HCL 5 MG/5ML PO SOLN: 5.0000 mg | Freq: Once | ORAL | Status: DC | PRN

## 2021-11-27 MED ORDER — EPHEDRINE 5 MG/ML INJ
INTRAVENOUS | Status: AC
  Filled 2021-11-27: qty 5

## 2021-11-27 MED ORDER — MIDAZOLAM HCL 2 MG/2ML IJ SOLN
INTRAMUSCULAR | Status: AC
  Filled 2021-11-27: qty 2

## 2021-11-27 MED ORDER — MIDAZOLAM HCL 2 MG/2ML IJ SOLN
2.0000 mg | Freq: Once | INTRAMUSCULAR | Status: AC
  Administered 2021-11-27: 2 mg via INTRAVENOUS

## 2021-11-27 MED ORDER — DEXAMETHASONE SODIUM PHOSPHATE 10 MG/ML IJ SOLN
INTRAMUSCULAR | Status: AC
  Filled 2021-11-27: qty 1

## 2021-11-27 MED ORDER — ACETAMINOPHEN 10 MG/ML IV SOLN: 1000.0000 mg | Freq: Once | INTRAVENOUS | Status: DC | PRN

## 2021-11-27 MED ORDER — ACETAMINOPHEN 500 MG PO TABS
1000.0000 mg | ORAL_TABLET | ORAL | Status: AC
  Administered 2021-11-27: 1000 mg via ORAL

## 2021-11-27 MED ORDER — ACETAMINOPHEN 500 MG PO TABS
ORAL_TABLET | ORAL | Status: AC
  Filled 2021-11-27: qty 2

## 2021-11-27 MED ORDER — SODIUM CHLORIDE 0.9 % IV SOLN
INTRAVENOUS | Status: DC | PRN
  Administered 2021-11-27: 500 mL

## 2021-11-27 MED ORDER — ATROPINE SULFATE 0.4 MG/ML IV SOLN
INTRAVENOUS | Status: AC
  Filled 2021-11-27: qty 1

## 2021-11-27 MED ORDER — LIDOCAINE 2% (20 MG/ML) 5 ML SYRINGE
INTRAMUSCULAR | Status: DC | PRN
  Administered 2021-11-27: 60 mg via INTRAVENOUS

## 2021-11-27 MED ORDER — SUCCINYLCHOLINE CHLORIDE 200 MG/10ML IV SOSY
PREFILLED_SYRINGE | INTRAVENOUS | Status: AC
  Filled 2021-11-27: qty 10

## 2021-11-27 MED ORDER — DEXAMETHASONE SODIUM PHOSPHATE 4 MG/ML IJ SOLN
INTRAMUSCULAR | Status: DC | PRN
  Administered 2021-11-27: 5 mg via INTRAVENOUS

## 2021-11-27 MED ORDER — ONDANSETRON HCL 4 MG/2ML IJ SOLN
INTRAMUSCULAR | Status: DC | PRN
  Administered 2021-11-27: 4 mg via INTRAVENOUS

## 2021-11-27 MED ORDER — PHENYLEPHRINE HCL (PRESSORS) 10 MG/ML IV SOLN
INTRAVENOUS | Status: DC | PRN
  Administered 2021-11-27 (×2): 40 ug via INTRAVENOUS

## 2021-11-27 MED ORDER — GABAPENTIN 300 MG PO CAPS
300.0000 mg | ORAL_CAPSULE | ORAL | Status: AC
  Administered 2021-11-27: 300 mg via ORAL

## 2021-11-27 MED ORDER — FENTANYL CITRATE (PF) 100 MCG/2ML IJ SOLN
INTRAMUSCULAR | Status: AC
  Filled 2021-11-27: qty 2

## 2021-11-27 MED ORDER — LACTATED RINGERS IV SOLN: INTRAVENOUS | Status: DC

## 2021-11-27 MED ORDER — FENTANYL CITRATE (PF) 100 MCG/2ML IJ SOLN
100.0000 ug | Freq: Once | INTRAMUSCULAR | Status: AC
  Administered 2021-11-27: 50 ug via INTRAVENOUS

## 2021-11-27 MED ORDER — PROPOFOL 10 MG/ML IV BOLUS
INTRAVENOUS | Status: DC | PRN
  Administered 2021-11-27: 200 mg via INTRAVENOUS

## 2021-11-27 MED ORDER — FENTANYL CITRATE (PF) 100 MCG/2ML IJ SOLN: 25.0000 ug | INTRAMUSCULAR | Status: DC | PRN

## 2021-11-27 MED ORDER — BUPIVACAINE-EPINEPHRINE (PF) 0.25% -1:200000 IJ SOLN
INTRAMUSCULAR | Status: DC | PRN
  Administered 2021-11-27: 17 mL

## 2021-11-27 MED ORDER — OXYCODONE HCL 5 MG PO TABS: 5.0000 mg | ORAL_TABLET | Freq: Once | ORAL | Status: DC | PRN

## 2021-11-27 SURGICAL SUPPLY — 59 items
ADH SKN CLS APL DERMABOND .7 (GAUZE/BANDAGES/DRESSINGS) ×1
APL PRP STRL LF DISP 70% ISPRP (MISCELLANEOUS)
APPLIER CLIP 9.375 MED OPEN (MISCELLANEOUS) ×2
APR CLP MED 9.3 20 MLT OPN (MISCELLANEOUS) ×1
BINDER BREAST LRG (GAUZE/BANDAGES/DRESSINGS) IMPLANT
BINDER BREAST MEDIUM (GAUZE/BANDAGES/DRESSINGS) IMPLANT
BINDER BREAST XLRG (GAUZE/BANDAGES/DRESSINGS) ×1 IMPLANT
BINDER BREAST XXLRG (GAUZE/BANDAGES/DRESSINGS) IMPLANT
BLADE SURG 15 STRL LF DISP TIS (BLADE) ×1 IMPLANT
BLADE SURG 15 STRL SS (BLADE) ×2
CANISTER SUC SOCK COL 7IN (MISCELLANEOUS) IMPLANT
CANISTER SUCT 1200ML W/VALVE (MISCELLANEOUS) ×2 IMPLANT
CHLORAPREP W/TINT 26 (MISCELLANEOUS) ×1 IMPLANT
CLIP APPLIE 9.375 MED OPEN (MISCELLANEOUS) ×1 IMPLANT
COVER BACK TABLE 60X90IN (DRAPES) ×2 IMPLANT
COVER MAYO STAND STRL (DRAPES) ×2 IMPLANT
COVER PROBE W GEL 5X96 (DRAPES) ×3 IMPLANT
DERMABOND ADVANCED (GAUZE/BANDAGES/DRESSINGS) ×1
DERMABOND ADVANCED .7 DNX12 (GAUZE/BANDAGES/DRESSINGS) ×1 IMPLANT
DRAPE LAPAROSCOPIC ABDOMINAL (DRAPES) ×2 IMPLANT
DRAPE UTILITY XL STRL (DRAPES) ×2 IMPLANT
DURAPREP 26ML APPLICATOR (WOUND CARE) ×1 IMPLANT
ELECT COATED BLADE 2.86 ST (ELECTRODE) ×2 IMPLANT
ELECT REM PT RETURN 9FT ADLT (ELECTROSURGICAL) ×2
ELECTRODE REM PT RTRN 9FT ADLT (ELECTROSURGICAL) ×1 IMPLANT
GLOVE BIOGEL PI IND STRL 7.0 (GLOVE) IMPLANT
GLOVE BIOGEL PI IND STRL 8 (GLOVE) ×1 IMPLANT
GLOVE BIOGEL PI INDICATOR 7.0 (GLOVE) ×3
GLOVE BIOGEL PI INDICATOR 8 (GLOVE) ×1
GLOVE ECLIPSE 8.0 STRL XLNG CF (GLOVE) ×2 IMPLANT
GLOVE SURG SS PI 6.5 STRL IVOR (GLOVE) ×1 IMPLANT
GLOVE SURG SYN 7.5  E (GLOVE) ×2
GLOVE SURG SYN 7.5 E (GLOVE) ×1 IMPLANT
GLOVE SURG SYN 7.5 PF PI (GLOVE) IMPLANT
GOWN STRL REUS W/ TWL LRG LVL3 (GOWN DISPOSABLE) ×2 IMPLANT
GOWN STRL REUS W/ TWL XL LVL3 (GOWN DISPOSABLE) ×1 IMPLANT
GOWN STRL REUS W/TWL LRG LVL3 (GOWN DISPOSABLE) ×6
GOWN STRL REUS W/TWL XL LVL3 (GOWN DISPOSABLE) ×4
HEMOSTAT ARISTA ABSORB 3G PWDR (HEMOSTASIS) IMPLANT
HEMOSTAT SNOW SURGICEL 2X4 (HEMOSTASIS) IMPLANT
KIT MARKER MARGIN INK (KITS) ×2 IMPLANT
NDL HYPO 25X1 1.5 SAFETY (NEEDLE) ×1 IMPLANT
NDL SAFETY ECLIPSE 18X1.5 (NEEDLE) IMPLANT
NEEDLE HYPO 18GX1.5 SHARP (NEEDLE)
NEEDLE HYPO 25X1 1.5 SAFETY (NEEDLE) ×2 IMPLANT
NS IRRIG 1000ML POUR BTL (IV SOLUTION) ×1 IMPLANT
PACK BASIN DAY SURGERY FS (CUSTOM PROCEDURE TRAY) ×2 IMPLANT
PENCIL SMOKE EVACUATOR (MISCELLANEOUS) ×2 IMPLANT
SLEEVE SCD COMPRESS KNEE MED (STOCKING) ×2 IMPLANT
SPIKE FLUID TRANSFER (MISCELLANEOUS) IMPLANT
SPONGE T-LAP 4X18 ~~LOC~~+RFID (SPONGE) ×2 IMPLANT
SUT MNCRL AB 4-0 PS2 18 (SUTURE) ×2 IMPLANT
SUT VICRYL 3-0 CR8 SH (SUTURE) ×2 IMPLANT
SYR CONTROL 10ML LL (SYRINGE) ×2 IMPLANT
TOWEL GREEN STERILE FF (TOWEL DISPOSABLE) ×2 IMPLANT
TRACER MAGTRACE VIAL (MISCELLANEOUS) ×1 IMPLANT
TRAY FAXITRON CT DISP (TRAY / TRAY PROCEDURE) ×2 IMPLANT
TUBE CONNECTING 20X1/4 (TUBING) ×2 IMPLANT
YANKAUER SUCT BULB TIP NO VENT (SUCTIONS) ×2 IMPLANT

## 2021-11-27 NOTE — Transfer of Care (Signed)
Immediate Anesthesia Transfer of Care Note ? ?Patient: Laura Patrick ? ?Procedure(s) Performed: LEFT BREAST LUMPECTOMY WITH RADIOACTIVE SEEDS X3 AND SENTINEL LYMPH NODE BIOPSY (Left: Breast) ? ?Patient Location: PACU ? ?Anesthesia Type:GA combined with regional for post-op pain ? ?Level of Consciousness: drowsy and patient cooperative ? ?Airway & Oxygen Therapy: Patient Spontanous Breathing and Patient connected to face mask oxygen ? ?Post-op Assessment: Report given to RN and Post -op Vital signs reviewed and stable ? ?Post vital signs: Reviewed and stable ? ?Last Vitals:  ?Vitals Value Taken Time  ?BP    ?Temp    ?Pulse 69 11/27/21 1523  ?Resp    ?SpO2 97 % 11/27/21 1523  ?Vitals shown include unvalidated device data. ? ?Last Pain:  ?Vitals:  ? 11/27/21 1050  ?TempSrc: Oral  ?PainSc: 0-No pain  ?   ? ?Patients Stated Pain Goal: 3 (11/27/21 1050) ? ?Complications: No notable events documented. ?

## 2021-11-27 NOTE — Discharge Instructions (Addendum)
[facility] Office Phone Number [phone]  BREAST BIOPSY/ PARTIAL MASTECTOMY: POST OP INSTRUCTIONS  Always review your discharge instruction sheet given to you by the facility where your surgery was performed.  IF YOU HAVE DISABILITY OR FAMILY LEAVE FORMS, YOU MUST BRING THEM TO THE OFFICE FOR PROCESSING.  DO NOT GIVE THEM TO YOUR DOCTOR.  A prescription for pain medication may be given to you upon discharge.  Take your pain medication as prescribed, if needed.  If narcotic pain medicine is not needed, then you may take acetaminophen (Tylenol) or ibuprofen (Advil) as needed. Take your usually prescribed medications unless otherwise directed If you need a refill on your pain medication, please contact your pharmacy.  They will contact our office to request authorization.  Prescriptions will not be filled after 5pm or on week-ends. You should eat very light the first 24 hours after surgery, such as soup, crackers, pudding, etc.  Resume your normal diet the day after surgery. Most patients will experience some swelling and bruising in the breast.  Ice packs and a good support bra will help.  Swelling and bruising can take several days to resolve.  It is common to experience some constipation if taking pain medication after surgery.  Increasing fluid intake and taking a stool softener will usually help or prevent this problem from occurring.  A mild laxative (Milk of Magnesia or Miralax) should be taken according to package directions if there are no bowel movements after 48 hours. Unless discharge instructions indicate otherwise, you may remove your bandages 24-48 hours after surgery, and you may shower at that time.  You may have steri-strips (small skin tapes) in place directly over the incision.  These strips should be left on the skin for 7-10 days.  If your surgeon used skin glue on the incision, you may shower in 24 hours.  The glue will flake off over the next 2-3 weeks.  Any  sutures or staples will be removed at the office during your follow-up visit. ACTIVITIES:  You may resume regular daily activities (gradually increasing) beginning the next day.  Wearing a good support bra or sports bra minimizes pain and swelling.  You may have sexual intercourse when it is comfortable. You may drive when you no longer are taking prescription pain medication, you can comfortably wear a seatbelt, and you can safely maneuver your car and apply brakes. RETURN TO WORK:  ______________________________________________________________________________________ You should see your doctor in the office for a follow-up appointment approximately two weeks after your surgery.  Your doctor's nurse will typically make your follow-up appointment when she calls you with your pathology report.  Expect your pathology report 2-3 business days after your surgery.  You may call to check if you do not hear from us after three days. OTHER INSTRUCTIONS: _______________________________________________________________________________________________ _____________________________________________________________________________________________________________________________________ _____________________________________________________________________________________________________________________________________ _____________________________________________________________________________________________________________________________________  WHEN TO CALL YOUR DOCTOR: Fever over 101.0 Nausea and/or vomiting. Extreme swelling or bruising. Continued bleeding from incision. Increased pain, redness, or drainage from the incision.  The clinic staff is available to answer your questions during regular business hours.  Please don't hesitate to call and ask to speak to one of the nurses for clinical concerns.  If you have a medical emergency, go to the nearest emergency room or call 911.  A surgeon from [facility] is always on call at the hospital.  For further questions, please visit [email or web address]    Post Anesthesia Home Care Instructions  Activity: Get plenty of rest for the remainder of   the day. A responsible individual must stay with you for 24 hours following the procedure.  For the next 24 hours, DO NOT: -Drive a car -Operate machinery -Drink alcoholic beverages -Take any medication unless instructed by your physician -Make any legal decisions or sign important papers.  Meals: Start with liquid foods such as gelatin or soup. Progress to regular foods as tolerated. Avoid greasy, spicy, heavy foods. If nausea and/or vomiting occur, drink only clear liquids until the nausea and/or vomiting subsides. Call your physician if vomiting continues.  Special Instructions/Symptoms: Your throat may feel dry or sore from the anesthesia or the breathing tube placed in your throat during surgery. If this causes discomfort, gargle with warm salt water. The discomfort should disappear within 24 hours.  If you had a scopolamine patch placed behind your ear for the management of post- operative nausea and/or vomiting:  1. The medication in the patch is effective for 72 hours, after which it should be removed.  Wrap patch in a tissue and discard in the trash. Wash hands thoroughly with soap and water. 2. You may remove the patch earlier than 72 hours if you experience unpleasant side effects which may include dry mouth, dizziness or visual disturbances. 3. Avoid touching the patch. Wash your hands with soap and water after contact with the patch.      

## 2021-11-27 NOTE — Op Note (Signed)
Preoperative diagnosis: Left breast cancer stage I upper outer quadrant with sclerosing papillary lesion ? ?Postoperative diagnosis: Same ? ?Procedure: Left breast seed localized lumpectomy utilizing 2 seeds for both cancer and papilloma and left axillary sentinel lymph node mapping with mag trace ? ?Surgeon: Erroll Luna, MD ? ?Assistant Dr. Cameron Sprang, MD ? ?Anesthesia: General with left pectoral block local ? ?EBL: Minimal ? ?Specimen: Left breast tissue they contain both masses consisting of the cancer with 2 clips and a single Hydra clip for the papilloma.  Additional margins were excised around the tumor bed as well and sent separately.  2 left axillary sentinel nodes noted ? ?Drains: None ? ?Indications for procedure: The patient presents for breast conserving surgery for stage I left breast cancer.  She also had a papilloma that was excised going to be excised same time.  These were both marked separately she presents today for breast conserving surgery and sentinel lymph node mapping for stage I left breast cancer.The procedure has been discussed with the patient. Alternatives to surgery have been discussed with the patient.  Risks of surgery include bleeding,  Infection,  Seroma formation, death,  and the need for further surgery.   The patient understands and wishes to proceed. Sentinel lymph node mapping and dissection has been discussed with the patient.  Risk of bleeding,  Infection,  Seroma formation,  Additional procedures,,  Shoulder weakness ,  Shoulder stiffness,  Nerve and blood vessel injury and reaction to the mapping dyes have been discussed.  Alternatives to surgery have been discussed with the patient.  The patient agrees to proceed.  ? ? ?Description of procedure: The patient was met in the holding area and questions were answered.  Films were available for review.  Left breast was marked as correct site and seeds were placed by the radiologist preoperatively.  All questions were  answered.  She underwent a pectoral block in the holding area.  She was then taken back to the operative room.  She was placed upon upon the OR table.  After induction of general anesthesia, left breast was prepped and draped in sterile fashion and timeout performed.  Proper patient, site and procedure verified.  Under sterile conditions 2 cc of mag tracer were injected into the nipple and massaged for 5 minutes.  Once this was performed the neoprobe was used to identify the seed locations in the central upper breast and outer quadrant of the left breast.  These were marked.  A transverse incision was made between both signals.  Dissection was carried superiorly to get the 6:00 lesion and then carried down toward the left upper outer quadrant and nipple to get the cancer itself.  This was taken as 1 specimen.  We then excised a margin around the cancer itself and sent the separately.  The Faxitron image revealed the seed and clip to be in the same specimen.  Irrigation was used and hemostasis achieved with cautery. ? ?The mag trace probe was used to identify a hotspot in the left axilla.  A 4 cm incision was made in the left axilla.  Dissection was carried down through the fascia into the left axillary 1 basin.  2 hot nodes were identified and removed.  Background counts approached baseline.  There is no palpable abnormality or adenopathy.  The long thoracic nerve, thoracodorsal trunk and extra vein were preserved.  Irrigation was used.  Hemostasis was achieved.  Both wounds were closed with deep layers of 3-0 Vicryl.  4 Monocryl  was used to close the skin in a subcuticular fashion.  Dermabond was applied to both.  Breast binder placed.  The patient was awoke extubated taken to recovery in satisfactory condition. ?

## 2021-11-27 NOTE — Progress Notes (Signed)
Assisted Dr. Smith Robert with left, pectoralis, ultrasound guided block. Side rails up, monitors on throughout procedure. See vital signs in flow sheet. Tolerated Procedure well. ?

## 2021-11-27 NOTE — Anesthesia Preprocedure Evaluation (Signed)
Anesthesia Evaluation  ? ? ?Reviewed: ?Allergy & Precautions, Patient's Chart, lab work & pertinent test results ? ?Airway ?Mallampati: I ? ? ? ? ? ? Dental ?no notable dental hx. ? ?  ?Pulmonary ?neg pulmonary ROS,  ?  ?Pulmonary exam normal ? ? ? ? ? ? ? Cardiovascular ?hypertension, Pt. on medications ?Normal cardiovascular exam ? ? ?  ?Neuro/Psych ?negative psych ROS  ? GI/Hepatic ?negative GI ROS, Neg liver ROS,   ?Endo/Other  ?negative endocrine ROS ? Renal/GU ?negative Renal ROS  ? ?  ?Musculoskeletal ?negative musculoskeletal ROS ?(+)  ? Abdominal ?Normal abdominal exam  (+)   ?Peds ? Hematology ?negative hematology ROS ?(+)   ?Anesthesia Other Findings ? ? Reproductive/Obstetrics ? ?  ? ? ? ? ? ? ? ? ? ? ? ? ? ?  ?  ? ? ? ? ? ? ? ? ?Anesthesia Physical ?Anesthesia Plan ? ?ASA: 2 ? ?Anesthesia Plan: General  ? ?Post-op Pain Management: Regional block*  ? ?Induction: Intravenous ? ?PONV Risk Score and Plan: 4 or greater and Ondansetron, Dexamethasone, Midazolam and Treatment may vary due to age or medical condition ? ?Airway Management Planned: LMA ? ?Additional Equipment: None ? ?Intra-op Plan:  ? ?Post-operative Plan: Extubation in OR ? ?Informed Consent: I have reviewed the patients History and Physical, chart, labs and discussed the procedure including the risks, benefits and alternatives for the proposed anesthesia with the patient or authorized representative who has indicated his/her understanding and acceptance.  ? ? ? ?Dental advisory given ? ?Plan Discussed with: CRNA ? ?Anesthesia Plan Comments:   ? ? ? ? ? ? ?Anesthesia Quick Evaluation ? ?

## 2021-11-27 NOTE — Anesthesia Procedure Notes (Signed)
Procedure Name: LMA Insertion ?Date/Time: 11/27/2021 1:20 PM ?Performed by: Ezequiel Kayser, CRNA ?Pre-anesthesia Checklist: Patient identified, Emergency Drugs available, Suction available and Patient being monitored ?Patient Re-evaluated:Patient Re-evaluated prior to induction ?Oxygen Delivery Method: Circle System Utilized ?Preoxygenation: Pre-oxygenation with 100% oxygen ?Induction Type: IV induction ?Ventilation: Mask ventilation without difficulty ?LMA: LMA inserted ?LMA Size: 4.0 ?Number of attempts: 1 ?Airway Equipment and Method: Bite block ?Placement Confirmation: positive ETCO2 ?Tube secured with: Tape ?Dental Injury: Teeth and Oropharynx as per pre-operative assessment  ? ? ? ? ?

## 2021-11-27 NOTE — Anesthesia Procedure Notes (Signed)
Anesthesia Regional Block: Pectoralis block  ? ?Pre-Anesthetic Checklist: , timeout performed,  Correct Patient, Correct Site, Correct Laterality,  Correct Procedure, Correct Position, site marked,  Risks and benefits discussed,  Surgical consent,  Pre-op evaluation,  At surgeon's request and post-op pain management ? ?Laterality: Left ? ?Prep: chloraprep     ?  ?Needles:  ?Injection technique: Single-shot ? ?Needle Type: Echogenic Stimulator Needle   ? ? ?Needle Length: 9cm  ?Needle Gauge: 21  ? ? ? ?Additional Needles: ? ? ?Procedures:,,,, ultrasound used (permanent image in chart),,    ?Narrative:  ?Start time: 11/27/2021 11:10 AM ?End time: 11/27/2021 11:15 AM ?Injection made incrementally with aspirations every 5 mL. ? ?Performed by: Personally  ?Anesthesiologist: Effie Berkshire, MD ? ?Additional Notes: ?Patient tolerated the procedure well. Local anesthetic introduced in an incremental fashion under minimal resistance after negative aspirations. No paresthesias were elicited. After completion of the procedure, no acute issues were identified and patient continued to be monitored by RN.  ? ? ? ? ? ?

## 2021-11-27 NOTE — H&P (Signed)
History of Present Illness: ?Laura Patrick is a 66 y.o. female who is seen today as an office consultation for evaluation of new breast ?.  ? ?Patient presents for evaluation of newly diagnosed left breast cancer. She had an area in her upper central to upper outer quadrant left breast noted on recent mammogram. Core biopsy showed invasive ductal carcinoma ER positive PR positive HER2/neu negative with a low Ki 67 5% no history of breast pain, breast mass or nipple discharge. She also had a small intraductal papilloma adjacent to this. ? ?Review of Systems: ?A complete review of systems was obtained from the patient. I have reviewed this information and discussed as appropriate with the patient. See HPI as well for other ROS. ? ? ? ?Medical History: ?Past Medical History:  ?Diagnosis Date  ? Hypertension  ? ?There is no problem list on file for this patient. ? ?Past Surgical History:  ?Procedure Laterality Date  ? REMOVAL OVARIAN CYST N/A  ? ? ?Allergies  ?Allergen Reactions  ? Venom-Honey Bee Other (See Comments) and Swelling  ?Both elbows to the hands, swells, and red water like blisters appear.  ? ? Amoxicillin Rash  ? ?Current Outpatient Medications on File Prior to Visit  ?Medication Sig Dispense Refill  ? amLODIPine (NORVASC) 10 MG tablet Take 1 tablet by mouth once daily  ? potassium chloride (KLOR-CON) 20 MEQ ER tablet Take 1 tablet by mouth once daily  ? valsartan-hydroCHLOROthiazide (DIOVAN-HCT) 320-12.5 mg tablet Take 1 tablet by mouth once daily  ? ?No current facility-administered medications on file prior to visit.  ? ?Family History  ?Problem Relation Age of Onset  ? Diabetes Mother  ? Stroke Father  ? High blood pressure (Hypertension) Father  ? ? ?Social History  ? ?Tobacco Use  ?Smoking Status Never  ?Smokeless Tobacco Never  ? ? ?Social History  ? ?Socioeconomic History  ? Marital status: Married  ?Tobacco Use  ? Smoking status: Never  ? Smokeless tobacco: Never  ?Vaping Use  ? Vaping Use:  Never used  ?Substance and Sexual Activity  ? Alcohol use: Never  ? Drug use: Never  ? ?Objective:  ? ?Vitals:  ?10/29/21 1413  ?BP: (!) 132/90  ?Pulse: 105  ?Temp: 37.1 ?C (98.7 ?F)  ?SpO2: 98%  ?Weight: 84.7 kg (186 lb 12.8 oz)  ?Height: 152.4 cm (5')  ? ?Body mass index is 36.48 kg/m?. ? ?Physical Exam ?HENT:  ?Head: Normocephalic.  ?Eyes:  ?Pupils: Pupils are equal, round, and reactive to light.  ?Cardiovascular:  ?Rate and Rhythm: Normal rate.  ?Pulmonary:  ?Effort: Pulmonary effort is normal.  ?Breath sounds: No stridor.  ?Chest:  ?Breasts: ?Right: Normal.  ? ?Musculoskeletal:  ?General: Normal range of motion.  ?Cervical back: Normal range of motion and neck supple.  ?Lymphadenopathy:  ?Upper Body:  ?Right upper body: No supraclavicular or axillary adenopathy.  ?Left upper body: No supraclavicular or axillary adenopathy.  ?Skin: ?General: Skin is warm and dry.  ?Neurological:  ?General: No focal deficit present.  ?Mental Status: She is alert.  ?Psychiatric:  ?Mood and Affect: Mood normal.  ?Behavior: Behavior normal.  ? ? ? ?Labs, Imaging and Diagnostic Testing: ? ? ? ? ? ?FINAL MICROSCOPIC DIAGNOSIS:  ? ?A. BREAST, LEFT, 11:00 6 CMFN MASS, BIOPSY:  ?- Invasive mammary carcinoma, see comment  ?- Intermediate grade ductal carcinoma in situ with calcifications  ? ?B. BREAST, LEFT, 12:00 4 CMFN MASS, BIOPSY:  ?- Fibrocystic change  ?- Small incidental intraductal papilloma  ? ?C.  BREAST, LEFT, 12:00 6 CMFN MASS BIOPSY:  ?- Sclerosing papillary lesion  ?- Negative for carcinoma  ? ?COMMENT:  ? ?Carcinoma in part A measures 0.2 cm in greatest linear dimension and  ?appears grade 2.  A breast prognostic profile (ER, PR, Ki-67 and HER2)  ?and immunohistochemical stain for E-cadherin are pending and will be  ?reported in an addendum.  Dr. Saralyn Pilar reviewed all parts from the case  ?and concurs with the diagnosis.  Ms. Electa Sniff was notified on  ?10/09/2021.  ? ?GROSS DESCRIPTION:  ? ?Specimen A: In formalin labeled  "Steffie, Waggoner" and "left 11:00 6  ?cm from nipple" (TIF 0825 and CIT <1 minute) are 4 cores of gray-white  ?to yellow-red soft to firm tissue which range from 0.8 x 0.1 x 0.1 cm to  ?1.4 x 0.1 x 0.1 cm, submitted 1 block.  ? ?Specimen B: In formalin labeled "Emaree, Chiu" and "left 12:00 4  ?cm from nipple" (TIF 0835 and CIT <1 minute) are several cores and  ?irregular pieces of pink-white to yellow, soft to firm tissue, 0.7 x 0.5  ?x 0.1 cm in aggregate.  1 block.  ? ?Specimen C: In formalin labeled "Consuella, [place]" and "left 12:00 6  ?cm from nipple" (TIF 0845 and CIT <1 minute) are 3 cores of pink-white  ?to yellow-red soft to firm tissue which range from 0.5 x 0.1 x 0.1 cm to  ?1.5 x 0.1 x 0.1 cm, submitted 1 block.  ? ?SW 10/08/2021  ? ?Final Diagnosis performed by Jaquita Folds, MD.   Electronically  ?signed 10/09/2021  ?Technical component performed at [facility], [place]  ?[address] [place], [place] [address].  ? Professional component performed at [facility],  ?[place] [address], [place], [place] [address].  ? Immunohistochemistry Technical component (if applicable) was performed  ?at [facility]. [place], [address],  ?[place], [place] [address].   IMMUNOHISTOCHEMISTRY DISCLAIMER (if applicable):  ?Some of these immunohistochemical stains may have been developed and the  ?performance characteristics determine by [facility]. Some  ?may not have been cleared or approved by the U.S. Food and Drug  ?Administration. The FDA has determined that such clearance or approval  ?is not necessary. This test is used for clinical purposes. It should not  ?be regarded as investigational or for research. This laboratory is  ?certified under the Clinical Laboratory Improvement Amendments of 1988  ?(CLIA-88) as qualified to perform high complexity clinical laboratory  ?testing.  The controls stained appropriately.  ? ?ADDENDUM:  ? ?A.   Immunohistochemical stain for E-cadherin is positive in the tumor  ?cells, consistent with a ductal phenotype.  ? ?Addendum #1 performed by Jaquita Folds, MD.   Electronically signed  ?10/10/2021  ?Technical component performed at [facility], [place]  ?[address] [place], [place] [address].  ? Professional component performed at [facility],  ?[place] [address], [place], [place] [address].  ? Immunohistochemistry Technical component (if applicable) was performed  ?at [facility]. [place], [address],  ?[place], [place] [address].   IMMUNOHISTOCHEMISTRY DISCLAIMER (if applicable):  ?Some of these immunohistochemical stains may have been developed and the  ?performance characteristics determine by [facility]. Some  ?may not have been cleared or approved by the U.S. Food and Drug  ?Administration. The FDA has determined that such clearance or approval  ?is not necessary. This test is used for clinical purposes. It should not  ?  be regarded as investigational or for research. This laboratory is  ?certified under the Clinical Laboratory Improvement Amendments of 1988  ?(CLIA-88) as qualified to perform high complexity clinical laboratory  ?testing.  The controls stained appropriately.  ? ?ADDENDUM:  ? ?PROGNOSTIC INDICATOR RESULTS:  ? ?Immunohistochemical and morphometric analysis performed manually  ? ?The tumor cells are NEGATIVE for Her2 (1+).  ? ?Estrogen Receptor: POSITIVE, 100% STRONG STAINING INTENSITY  ?Progesterone Receptor: POSITIVE, 60% STRONG STAINING INTENSITY  ?Proliferation Marker Ki-67: <5%  ? ?Reference Range Estrogen and Progesterone Receptor  ?     Negative  0%  ?     Positive  >1%  ? ?All controls stained appropriately.  ? ?Addendum #2 performed by Thressa Sheller, MD.   Electronically signed  ?10/12/2021  ?Technical component performed at [facility], [place]  ?[address] [place], [place] [address].  ? Professional  component performed at [facility]. [place] [address] [place],  [address].  ? Immunohistochemistry Technical component (if applicable) was performed  ?at [place]

## 2021-11-27 NOTE — Interval H&P Note (Signed)
History and Physical Interval Note: ? ?11/27/2021 ?12:45 PM ? ?Laura Patrick  has presented today for surgery, with the diagnosis of LEFT BREAST CANCER.  The various methods of treatment have been discussed with the patient and family. After consideration of risks, benefits and other options for treatment, the patient has consented to  Procedure(s): ?LEFT BREAST LUMPECTOMY WITH RADIOACTIVE SEED X3 AND SENTINEL LYMPH NODE BIOPSY (Left) as a surgical intervention.  The patient's history has been reviewed, patient examined, no change in status, stable for surgery.  I have reviewed the patient's chart and labs.  Questions were answered to the patient's satisfaction.   ? ? ?Arloa Prak A Domenique Southers ? ? ?

## 2021-11-28 ENCOUNTER — Encounter: Payer: Self-pay | Admitting: *Deleted

## 2021-11-28 ENCOUNTER — Encounter (HOSPITAL_BASED_OUTPATIENT_CLINIC_OR_DEPARTMENT_OTHER): Payer: Self-pay | Admitting: Surgery

## 2021-11-28 ENCOUNTER — Other Ambulatory Visit (HOSPITAL_COMMUNITY): Payer: Self-pay

## 2021-11-28 NOTE — Anesthesia Postprocedure Evaluation (Signed)
Anesthesia Post Note ? ?Patient: Laura Patrick ? ?Procedure(s) Performed: LEFT BREAST LUMPECTOMY WITH RADIOACTIVE SEEDS X3 AND SENTINEL LYMPH NODE BIOPSY (Left: Breast) ? ?  ? ?Patient location during evaluation: PACU ?Anesthesia Type: General ?Level of consciousness: awake and alert ?Pain management: pain level controlled ?Vital Signs Assessment: post-procedure vital signs reviewed and stable ?Respiratory status: spontaneous breathing, nonlabored ventilation, respiratory function stable and patient connected to nasal cannula oxygen ?Cardiovascular status: blood pressure returned to baseline and stable ?Postop Assessment: no apparent nausea or vomiting ?Anesthetic complications: no ? ? ?No notable events documented. ? ?Last Vitals:  ?Vitals:  ? 11/27/21 1615 11/27/21 1652  ?BP: 124/72 (!) 152/78  ?Pulse: 63 70  ?Resp: 17 16  ?Temp:  36.7 ?C  ?SpO2: 93% 95%  ?  ?Last Pain:  ?Vitals:  ? 11/28/21 0938  ?TempSrc:   ?PainSc: 0-No pain  ? ? ?  ?  ?  ?  ?  ?  ? ?Abou Sterkel ? ? ? ? ?

## 2021-11-28 NOTE — Progress Notes (Signed)
Patient had her lumpectomy on 11/27/2021. Will follow for path results.  ? ?Oncology Nurse Navigator Documentation ? ? ?  11/28/2021  ?  9:00 AM  ?Oncology Nurse Navigator Flowsheets  ?Phase of Treatment Surgery  ?Surgery Actual Start Date: 11/27/2021  ?Navigator Follow Up Date: 12/02/2021  ?Navigator Follow Up Reason: Pathology  ?Navigator Location [facility]  ?Navigator Encounter Type Appt/Treatment Plan Review  ?Patient Visit Type MedOnc  ?Treatment Phase Active Tx  ?Barriers/Navigation Needs Coordination of Care;Education  ?Interventions None Required  ?Acuity Level 2-Minimal Needs (1-2 Barriers Identified)  ?Support Groups/Services Friends and Family  ?Time Spent with Patient 15  ?  ?

## 2021-12-02 LAB — SURGICAL PATHOLOGY

## 2021-12-03 ENCOUNTER — Other Ambulatory Visit: Payer: Self-pay | Admitting: Family Medicine

## 2021-12-03 ENCOUNTER — Other Ambulatory Visit (HOSPITAL_COMMUNITY): Payer: Self-pay

## 2021-12-03 DIAGNOSIS — I10 Essential (primary) hypertension: Secondary | ICD-10-CM

## 2021-12-03 MED ORDER — AMLODIPINE BESYLATE 10 MG PO TABS
10.0000 mg | ORAL_TABLET | Freq: Every day | ORAL | 0 refills | Status: DC
  Filled 2021-12-03: qty 90, 90d supply, fill #0

## 2021-12-03 MED ORDER — VALSARTAN-HYDROCHLOROTHIAZIDE 320-12.5 MG PO TABS
1.0000 | ORAL_TABLET | Freq: Every day | ORAL | 0 refills | Status: DC
  Filled 2021-12-03: qty 90, 90d supply, fill #0

## 2021-12-03 MED ORDER — POTASSIUM CHLORIDE CRYS ER 20 MEQ PO TBCR
20.0000 meq | EXTENDED_RELEASE_TABLET | Freq: Every day | ORAL | 0 refills | Status: DC
  Filled 2021-12-03: qty 90, 90d supply, fill #0

## 2021-12-04 ENCOUNTER — Encounter: Payer: Self-pay | Admitting: *Deleted

## 2021-12-04 NOTE — Progress Notes (Signed)
Per Dr Marin Olp, Oncotype Score testing sent on specimen [id number] DOS 11/27/21. ? ?Patient will need to be seen in about 3 weeks. Message sent to scheduling.  ? ?Oncology Nurse Navigator Documentation ? ? ?  12/04/2021  ?  9:00 AM  ?Oncology Nurse Navigator Flowsheets  ?Navigator Follow Up Date: 12/27/2021  ?Navigator Follow Up Reason: Follow-up Appointment  ?Navigator Location [facility]  ?Navigator Encounter Type Pathology Review  ?Patient Visit Type MedOnc  ?Treatment Phase Active Tx  ?Barriers/Navigation Needs Coordination of Care;Education  ?Interventions Coordination of Care  ?Acuity Level 2-Minimal Needs (1-2 Barriers Identified)  ?Coordination of Care Appts;Pathology  ?Support Groups/Services Friends and Family  ?Time Spent with Patient 30  ?  ?

## 2021-12-09 ENCOUNTER — Other Ambulatory Visit (HOSPITAL_BASED_OUTPATIENT_CLINIC_OR_DEPARTMENT_OTHER): Payer: Self-pay

## 2021-12-10 ENCOUNTER — Encounter (HOSPITAL_COMMUNITY): Payer: Self-pay

## 2021-12-11 DIAGNOSIS — Z170 Estrogen receptor positive status [ER+]: Secondary | ICD-10-CM | POA: Diagnosis not present

## 2021-12-11 DIAGNOSIS — C50912 Malignant neoplasm of unspecified site of left female breast: Secondary | ICD-10-CM | POA: Diagnosis not present

## 2021-12-12 ENCOUNTER — Encounter: Payer: Self-pay | Admitting: *Deleted

## 2021-12-12 ENCOUNTER — Encounter (HOSPITAL_COMMUNITY): Payer: Self-pay

## 2021-12-12 NOTE — Progress Notes (Signed)
Oncotype DX Breast RS received for this patient. Result 15. Provided Dr Marin Olp with report.  ? ?Oncology Nurse Navigator Documentation ? ? ?  12/12/2021  ?  8:00 AM  ?Oncology Nurse Navigator Flowsheets  ?Navigator Follow Up Date: 12/27/2021  ?Navigator Follow Up Reason: Follow-up Appointment  ?Navigator Location [facility]  ?Navigator Encounter Type Pathology Review  ?Patient Visit Type MedOnc  ?Treatment Phase Active Tx  ?Barriers/Navigation Needs Coordination of Care;Education  ?Interventions None Required  ?Acuity Level 2-Minimal Needs (1-2 Barriers Identified)  ?Coordination of Care Pathology  ?Support Groups/Services Friends and Family  ?Time Spent with Patient 15  ?  ?

## 2021-12-19 ENCOUNTER — Ambulatory Visit: Payer: 59 | Attending: Surgery | Admitting: Physical Therapy

## 2021-12-19 DIAGNOSIS — C50212 Malignant neoplasm of upper-inner quadrant of left female breast: Secondary | ICD-10-CM | POA: Insufficient documentation

## 2021-12-19 DIAGNOSIS — R293 Abnormal posture: Secondary | ICD-10-CM | POA: Insufficient documentation

## 2021-12-19 DIAGNOSIS — Z170 Estrogen receptor positive status [ER+]: Secondary | ICD-10-CM | POA: Insufficient documentation

## 2021-12-26 ENCOUNTER — Other Ambulatory Visit: Payer: Self-pay | Admitting: *Deleted

## 2021-12-26 DIAGNOSIS — C50212 Malignant neoplasm of upper-inner quadrant of left female breast: Secondary | ICD-10-CM

## 2021-12-27 ENCOUNTER — Encounter: Payer: Self-pay | Admitting: *Deleted

## 2021-12-27 ENCOUNTER — Telehealth: Payer: Self-pay | Admitting: Radiation Oncology

## 2021-12-27 ENCOUNTER — Inpatient Hospital Stay: Payer: 59 | Admitting: Hematology & Oncology

## 2021-12-27 ENCOUNTER — Inpatient Hospital Stay: Payer: 59 | Attending: Hematology & Oncology

## 2021-12-27 ENCOUNTER — Encounter: Payer: Self-pay | Admitting: Hematology & Oncology

## 2021-12-27 VITALS — BP 149/72 | HR 61 | Temp 98.6°F | Resp 20 | Wt 186.4 lb

## 2021-12-27 DIAGNOSIS — C50212 Malignant neoplasm of upper-inner quadrant of left female breast: Secondary | ICD-10-CM | POA: Diagnosis not present

## 2021-12-27 DIAGNOSIS — Z91030 Bee allergy status: Secondary | ICD-10-CM | POA: Diagnosis not present

## 2021-12-27 DIAGNOSIS — Z79899 Other long term (current) drug therapy: Secondary | ICD-10-CM | POA: Insufficient documentation

## 2021-12-27 DIAGNOSIS — Z170 Estrogen receptor positive status [ER+]: Secondary | ICD-10-CM

## 2021-12-27 DIAGNOSIS — C50919 Malignant neoplasm of unspecified site of unspecified female breast: Secondary | ICD-10-CM | POA: Diagnosis not present

## 2021-12-27 DIAGNOSIS — Z880 Allergy status to penicillin: Secondary | ICD-10-CM | POA: Insufficient documentation

## 2021-12-27 DIAGNOSIS — C50019 Malignant neoplasm of nipple and areola, unspecified female breast: Secondary | ICD-10-CM

## 2021-12-27 DIAGNOSIS — Z79811 Long term (current) use of aromatase inhibitors: Secondary | ICD-10-CM | POA: Insufficient documentation

## 2021-12-27 HISTORY — DX: Malignant neoplasm of unspecified site of unspecified female breast: C50.919

## 2021-12-27 LAB — CMP (CANCER CENTER ONLY)
ALT: 11 U/L (ref 0–44)
AST: 8 U/L — ABNORMAL LOW (ref 15–41)
Albumin: 4.8 g/dL (ref 3.5–5.0)
Alkaline Phosphatase: 86 U/L (ref 38–126)
Anion gap: 8 (ref 5–15)
BUN: 11 mg/dL (ref 8–23)
CO2: 30 mmol/L (ref 22–32)
Calcium: 10.4 mg/dL — ABNORMAL HIGH (ref 8.9–10.3)
Chloride: 102 mmol/L (ref 98–111)
Creatinine: 0.9 mg/dL (ref 0.44–1.00)
GFR, Estimated: >60 mL/min (ref >60)
Glucose, Bld: 101 mg/dL — ABNORMAL HIGH (ref 70–99)
Potassium: 4.2 mmol/L (ref 3.5–5.1)
Sodium: 140 mmol/L (ref 135–145)
Total Bilirubin: 0.4 mg/dL (ref 0.3–1.2)
Total Protein: 8.5 g/dL — ABNORMAL HIGH (ref 6.5–8.1)

## 2021-12-27 LAB — CBC WITH DIFFERENTIAL (CANCER CENTER ONLY)
Abs Immature Granulocytes: 0.01 10*3/uL (ref 0.00–0.07)
Basophils Absolute: 0 10*3/uL (ref 0.0–0.1)
Basophils Relative: 1 %
Eosinophils Absolute: 0.1 10*3/uL (ref 0.0–0.5)
Eosinophils Relative: 3 %
HCT: 42.4 % (ref 36.0–46.0)
Hemoglobin: 13.9 g/dL (ref 12.0–15.0)
Immature Granulocytes: 0 %
Lymphocytes Relative: 38 %
Lymphs Abs: 1.6 10*3/uL (ref 0.7–4.0)
MCH: 28.5 pg (ref 26.0–34.0)
MCHC: 32.8 g/dL (ref 30.0–36.0)
MCV: 86.9 fL (ref 80.0–100.0)
Monocytes Absolute: 0.4 10*3/uL (ref 0.1–1.0)
Monocytes Relative: 9 %
Neutro Abs: 2 10*3/uL (ref 1.7–7.7)
Neutrophils Relative %: 49 %
Platelet Count: 265 10*3/uL (ref 150–400)
RBC: 4.88 MIL/uL (ref 3.87–5.11)
RDW: 14.8 % (ref 11.5–15.5)
WBC Count: 4.2 10*3/uL (ref 4.0–10.5)
nRBC: 0 % (ref 0.0–0.2)

## 2021-12-27 NOTE — Telephone Encounter (Signed)
LVM to schedule consultation with Dr. Sondra Come

## 2021-12-27 NOTE — Progress Notes (Signed)
Hematology and Oncology Follow Up Visit  Laura Patrick [id number] 1956/07/01 66 y.o. 12/27/2021   Principle Diagnosis:  Stage IA (T1aN0M0) infiltrating ductal carcinoma of the LEFT breast   --ER+/PR+/HER2- -- Oncotype =15  Current Therapy:   Status post left lumpectomy on 11/27/2021 Radiation therapy --start in 01/21/2022 Femara 2.5 mg p.o. daily -start after XRT     Interim History:  Laura Patrick is back for her second office visit.  We first saw her back in March.  Since then, she did have a lumpectomy.  This was on 11/27/2021.  The pathology report ([id number]) showed an invasive ductal carcinoma that was grade 1.  It was 0.5 cm.  All margins were negative.  2 lymph nodes were negative.  The tumor is ER positive/PR positive and HER2 negative.  The Oncotype score is low at 15.  She looks great.  As always, Dr. Brantley Stage has done a fantastic job with her surgery.  She is healing quite nicely.  We will have to get her over to Radiation Oncology for radiation therapy.  She has had no problems with nausea or vomiting.  She has had no change in bowel or bladder habits.  There is been no hot flashes.  She has had no leg swelling.  There is been no lymphedema of the left arm.  She has had no headache.  There is been no bleeding.  Overall, I would say performance status is probably ECOG 0.  Medications:  Current Outpatient Medications:    amLODipine (NORVASC) 10 MG tablet, Take 1 tablet (10 mg total) by mouth daily., Disp: 90 tablet, Rfl: 0   potassium chloride SA (KLOR-CON M) 20 MEQ tablet, Take 1 tablet (20 mEq total) by mouth daily., Disp: 90 tablet, Rfl: 0   oxyCODONE (OXY IR/ROXICODONE) 5 MG immediate release tablet, Take 1 tablet (5 mg total) by mouth every 6 (six) hours as needed for severe pain. (Patient not taking: Reported on 12/27/2021), Disp: 15 tablet, Rfl: 0   valsartan-hydrochlorothiazide (DIOVAN-HCT) 320-12.5 MG tablet, Take 1 tablet by mouth daily. (Patient not  taking: Reported on 12/27/2021), Disp: 90 tablet, Rfl: 0  Allergies:  Allergies  Allergen Reactions   Bee Venom Swelling and Other (See Comments)    Both elbows to the hands, swells, and red water like blisters appear.    Amoxicillin Rash    Past Medical History, Surgical history, Social history, and Family History were reviewed and updated.  Review of Systems: Review of Systems  Constitutional: Negative.   HENT:  Negative.    Eyes: Negative.   Respiratory: Negative.    Cardiovascular: Negative.   Gastrointestinal: Negative.   Endocrine: Negative.   Genitourinary: Negative.    Musculoskeletal: Negative.   Skin: Negative.   Neurological: Negative.   Hematological: Negative.   Psychiatric/Behavioral: Negative.     Physical Exam:  weight is 186 lb 6.4 oz (84.6 kg). Her oral temperature is 98.6 F (37 C). Her blood pressure is 149/72 (abnormal) and her pulse is 61. Her respiration is 20 and oxygen saturation is 99%.   Wt Readings from Last 3 Encounters:  12/27/21 186 lb 6.4 oz (84.6 kg)  11/27/21 185 lb 13.6 oz (84.3 kg)  10/18/21 186 lb (84.4 kg)    Physical Exam Vitals reviewed.  Constitutional:      Comments: Breast exam shows right breast with no masses, edema or erythema.  There is no right axillary adenopathy.  Left breast shows a well-healed lumpectomy.  This is at the 12 o'clock  position.  There is no erythema or warmth of the left breast.  She has a healing left lymphadenectomy scar.  HENT:     Head: Normocephalic and atraumatic.  Eyes:     Pupils: Pupils are equal, round, and reactive to light.  Cardiovascular:     Rate and Rhythm: Normal rate and regular rhythm.     Heart sounds: Normal heart sounds.  Pulmonary:     Effort: Pulmonary effort is normal.     Breath sounds: Normal breath sounds.  Abdominal:     General: Bowel sounds are normal.     Palpations: Abdomen is soft.  Musculoskeletal:        General: No tenderness or deformity. Normal range of  motion.     Cervical back: Normal range of motion.  Lymphadenopathy:     Cervical: No cervical adenopathy.  Skin:    General: Skin is warm and dry.     Findings: No erythema or rash.  Neurological:     Mental Status: She is alert and oriented to person, place, and time.  Psychiatric:        Behavior: Behavior normal.        Thought Content: Thought content normal.        Judgment: Judgment normal.     Lab Results  Component Value Date   WBC 4.2 12/27/2021   HGB 13.9 12/27/2021   HCT 42.4 12/27/2021   MCV 86.9 12/27/2021   PLT 265 12/27/2021     Chemistry      Component Value Date/Time   NA 140 12/27/2021 0951   NA 141 01/03/2019 0836   K 4.2 12/27/2021 0951   CL 102 12/27/2021 0951   CO2 30 12/27/2021 0951   BUN 11 12/27/2021 0951   BUN 15 01/03/2019 0836   CREATININE 0.90 12/27/2021 0951   CREATININE 0.91 10/06/2016 1529      Component Value Date/Time   CALCIUM 10.4 (H) 12/27/2021 0951   ALKPHOS 86 12/27/2021 0951   AST 8 (L) 12/27/2021 0951   ALT 11 12/27/2021 0951   BILITOT 0.4 12/27/2021 0951      Impression and Plan: Laura Patrick is a very nice 66 year old postmenopausal African-American female.  She has a stage Ia infiltrating ductal carcinoma of the LEFT breast.  This has very good prognostic markers.  The Oncotype score is only 15.  She will need radiation therapy.  I would think that she will not need a full 6 weeks of radiation given that this is a relatively small tumor.  She will definitely need hormonal therapy.  I will put her on Femara after she completes her radiation therapy.  I think the risk of recurrence should be less than 5% given the Oncotype score.  She really has recovered nicely from her surgery.  She is incredibly tough.  I would like to see her back in about 6 weeks.  By then, she should be finished with her radiation therapy and we can start her on Femara.   Volanda Napoleon, MD 5/26/202310:53 AM

## 2021-12-27 NOTE — Progress Notes (Unsigned)
Met with patient prior to MD visit. She is doing well after surgery. She had her follow up with Dr Brantley Stage today and he is pleased with her healing. She states she has little to no discomfort and is ready for her next step.  Spoke with patient about genetics. We spoke earlier but patient wanted to wait until after surgery. Gave her Genetic Handout and reviewed the benefits to testing. She will think about it and let me know.   Patient will need to be evaluated by Radiation Oncology. Referral order placed. Dr Marin Olp will speak directly to Dr Sondra Come.   Oncology Nurse Navigator Documentation     12/27/2021   10:15 AM  Oncology Nurse Navigator Flowsheets  Planned Course of Treatment Radiation  Phase of Treatment Radiation  Radiation Pending-Reason: Authorization  Navigator Follow Up Date: 01/13/2022  Navigator Follow Up Reason: Appointment Review  Navigator Location [facility]  Navigator Encounter Type Follow-up Appt  Patient Visit Type MedOnc  Treatment Phase Active Tx  Barriers/Navigation Needs Coordination of Care;Education  Interventions Education;Psycho-Social Support  Acuity Level 2-Minimal Needs (1-2 Barriers Identified)  Education Method Verbal;Written  Support Groups/Services Friends and Family  Time Spent with Patient 30

## 2022-01-06 NOTE — Therapy (Signed)
OUTPATIENT PHYSICAL THERAPY BREAST CANCER POST OP FOLLOW UP   Patient Name: Laura Patrick MRN: [id number] DOB:08/28/1955, 66 y.o., female Today's Date: 01/08/2022   PT End of Session - 01/08/22 0801     Visit Number 2    Number of Visits 2    Date for PT Re-Evaluation 12/31/21    PT Start Time 0800    PT Stop Time 0840    PT Time Calculation (min) 40 min    Activity Tolerance Patient tolerated treatment well    Behavior During Therapy [facility] for tasks assessed/performed             Past Medical History:  Diagnosis Date   Anemia    Cancer ([place]) 10/2021   left breast IDC   Hypertension    Ovarian cyst    Stage I breast cancer in female [facility]) 12/27/2021   Past Surgical History:  Procedure Laterality Date   BREAST LUMPECTOMY WITH RADIOACTIVE SEED AND SENTINEL LYMPH NODE BIOPSY Left 11/27/2021   Procedure: LEFT BREAST LUMPECTOMY WITH RADIOACTIVE SEEDS X3 AND SENTINEL LYMPH NODE BIOPSY;  Surgeon: Erroll Luna, MD;  Location: [facility];  Service: General;  Laterality: Left;   COLONOSCOPY     OOPHORECTOMY Right 1988   OVARIAN CYST REMOVAL     TOE SURGERY     Patient Active Problem List   Diagnosis Date Noted   Stage I breast cancer in female ([place]) 12/27/2021   Retinal arteriolar sclerosis 11/13/2020   Vitamin D deficiency 12/28/2019   History of vitamin D deficiency 05/29/2017   Physical exam 08/10/2014   Encounter for Papanicolaou smear of cervix 08/10/2014   Morbid obesity ([place]) 10/14/2013   Pain in limb 05/23/2010   PES PLANUS 05/23/2010   PARESTHESIA 05/23/2010   HYPOPOTASSEMIA 08/30/2008   Hyperlipidemia 04/12/2007   SYNDROME, CARPAL TUNNEL 04/12/2007   Essential hypertension 04/12/2007    PCP: Midge Minium, MD  REFERRING PROVIDER:  Erroll Luna, MD  REFERRING DIAG:  C50.212,Z17.0 (ICD-10-CM) - Malignant neoplasm of upper-inner quadrant of left breast in female, estrogen receptor positive ([place])   THERAPY DIAG:  Abnormal  posture  Malignant neoplasm of upper-inner quadrant of left breast in female, estrogen receptor positive ([place])  Rationale for Evaluation and Treatment Rehabilitation  ONSET DATE: 10/11/21  SUBJECTIVE:                                                                                                                                                                                           SUBJECTIVE STATEMENT: In the beginning I was afraid to move the arm but now I have been doing the exercises.  Everything went really well.   PERTINENT HISTORY:  Patient was diagnosed on 10/11/21 with left grade 2. It measures 0.2 cm and is located in the upper outer quadrant. It is ER/PR+, HER2- with a Ki67 of 5%. Pt underwent a L breast lumpectomy and SLNB (0/2) on 11/27/21  PATIENT GOALS:  Reassess how my recovery is going related to arm function, pain, and swelling.  PAIN:  Are you having pain? No  PRECAUTIONS: Recent Surgery, left UE Lymphedema risk,   ACTIVITY LEVEL / LEISURE: pt has returned to walking at work and has been doing her home exercise program   OBJECTIVE:   PATIENT SURVEYS:  QUICK DASH:  Quick Dash - 01/08/22 0001     Open a tight or new jar No difficulty    Do heavy household chores (wash walls, wash floors) No difficulty    Carry a shopping bag or briefcase No difficulty    Wash your back Mild difficulty    Use a knife to cut food No difficulty    Recreational activities in which you take some force or impact through your arm, shoulder, or hand (golf, hammering, tennis) No difficulty    During the past week, to what extent has your arm, shoulder or hand problem interfered with your normal social activities with family, friends, neighbors, or groups? Not at all    During the past week, to what extent has your arm, shoulder or hand problem limited your work or other regular daily activities Not at all    Arm, shoulder, or hand pain. None    Tingling (pins and needles) in your arm,  shoulder, or hand None    Difficulty Sleeping No difficulty    DASH Score 2.27 %             OBSERVATIONS:  Some increased pore size in L breast due to post op swelling  POSTURE:  Forward head and rounded shoulders  LYMPHEDEMA ASSESSMENT:     UPPER EXTREMITY AROM/PROM:   A/PROM RIGHT  11/05/2021    Shoulder extension 68  Shoulder flexion 164  Shoulder abduction 165  Shoulder internal rotation 54  Shoulder external rotation 95                          (Blank rows = not tested)   A/PROM LEFT  11/05/2021 L 01/08/22  Shoulder extension 59 66  Shoulder flexion 163 163  Shoulder abduction 174 179  Shoulder internal rotation 67 67  Shoulder external rotation 72 88                          (Blank rows = not tested)     CERVICAL AROM: All within normal limits:      Percent limited  Flexion WFL  Extension WFL  Right lateral flexion WFL  Left lateral flexion WFL  Right rotation WFL  Left rotation WFL        UPPER EXTREMITY STRENGTH: 5/5     LYMPHEDEMA ASSESSMENTS:    LANDMARK RIGHT  11/05/2021  10 cm proximal to olecranon process 33.8  Olecranon process 27.5  10 cm proximal to ulnar styloid process 23.5  Just proximal to ulnar styloid process 16.7  Across hand at thumb web space 20.3  At base of 2nd digit 6.6  (Blank rows = not tested)   [facility] LEFT  11/05/2021 01/08/22  10 cm proximal to olecranon process 34.5 34.9  Olecranon process  27.7 28.5  10 cm proximal to ulnar styloid process 23 23  Just proximal to ulnar styloid process 16.5 16.5  Across hand at thumb web space 20.5 20  At base of 2nd digit 6.8 6.7  (Blank rows = not tested)      Surgery type/Date: L lumpectomy on 11/27/21 Number of lymph nodes removed: 0/2 Current/past treatment (chemo, radiation, hormone therapy): pt going for radiation consult on the 12th, does not require chemo Other symptoms:  Heaviness/tightness No Pain No Pitting edema No Infections No Decreased scar mobility No Stemmer  sign No   PATIENT EDUCATION:  Education details: scar mobilization, wear compression bra, continue exercises Person educated: Patient Education method: Explanation and Handouts Education comprehension: verbalized understanding   HOME EXERCISE PROGRAM:  Reviewed previously given post op HEP. Wear chip pack and compression bra  TREATMENT  Created foam chip pack for pt to wear in her compression bra to help reduce L breast swelling  Cut 1/2 grey foam and placed in TG soft for pt to place under zipper area of compression bra to decrease skin breakdown from zipper rubbing  ASSESSMENT:  CLINICAL IMPRESSION: Pt returns to PT after undergoing a L lumpectomy and SLNB (0/2) on 11/27/21. She has returned to baseline shoulder ROM. She has started walking again and has been doing her home exercise program. She is not having any pain. She does have some minimal L breast swelling and was given a chip pack and encouraged to wear her compression bra. This is most likely post op swelling but pt was educated to return to PT if this does not resolve in approximately 4 weeks. Pt will be discharged from skilled PT services at this time but will continue to benefit from l dex screenings every 3 months for the first 2 years post op to assess for subclinical lymphedema.   Pt will benefit from skilled therapeutic intervention to improve on the following deficits: Decreased knowledge of precautions, impaired UE functional use, pain, decreased ROM, postural dysfunction.   PT treatment/interventions: ADL/Self care home management, Therapeutic exercises and Patient/Family education     GOALS: Goals reviewed with patient? Yes  LONG TERM GOALS:  (STG=LTG)  GOALS Name Target Date  Goal status  1 Pt will demonstrate she has regained full shoulder ROM and function post operatively compared to baselines.  Baseline: 01/08/22 MET     PLAN: PT FREQUENCY/DURATION: d/c this session  PLAN FOR NEXT SESSION: d/c this  visit, continue with ldex screenings every 3 months for the first 2 yrs post op (11/27/21)   [facility]  [address], [address]  [place] [address]  [phone]  After Breast Cancer Class It is recommended you attend the ABC class to be educated on lymphedema risk reduction. This class is free of charge and lasts for 1 hour. It is a 1-time class. You will need to download the Webex app either on your phone or computer. We will send you a link the night before or the morning of the class. You should be able to click on that link to join the class. This is not a confidential class. You don't have to turn your camera on, but other participants may be able to see your email address.  Scar massage You can begin gentle scar massage to you incision sites. Gently place one hand on the incision and move the skin (without sliding on the skin) in various directions. Do this for a few minutes and then you can  gently massage either coconut oil or vitamin E cream into the scars.  Compression garment You should continue wearing your compression bra until you feel like you no longer have swelling.  Home exercise Program Continue doing the exercises you were given until you feel like you can do them without feeling any tightness at the end.   Walking Program Studies show that 30 minutes of walking per day (fast enough to elevate your heart rate) can significantly reduce the risk of a cancer recurrence. If you can't walk due to other medical reasons, we encourage you to find another activity you could do (like a stationary bike or water exercise).  Posture After breast cancer surgery, people frequently sit with rounded shoulders posture because it puts their incisions on slack and feels better. If you sit like this and scar tissue forms in that position, you can become very tight and have pain sitting or standing with good posture. Try to be aware of your posture and sit and stand up  tall to heal properly.  Follow up PT: It is recommended you return every 3 months for the first 3 years following surgery to be assessed on the SOZO machine for an L-Dex score. This helps prevent clinically significant lymphedema in 95% of patients. These follow up screens are 10 minute appointments that you are not billed for.  Allyson Sabal [place], PT 01/08/2022, 8:57 AM   PHYSICAL THERAPY DISCHARGE SUMMARY  Visits from Start of Care: 2  Current functional level related to goals / functional outcomes: All goals met   Remaining deficits: Some post op L breast swelling   Education / Equipment: HEP   Patient agrees to discharge. Patient goals were met. Patient is being discharged due to meeting the stated rehab goals.  Allyson Sabal [place], [place] 01/08/22 8:58 AM

## 2022-01-08 ENCOUNTER — Ambulatory Visit: Payer: 59 | Attending: Surgery | Admitting: Physical Therapy

## 2022-01-08 ENCOUNTER — Encounter: Payer: Self-pay | Admitting: Physical Therapy

## 2022-01-08 DIAGNOSIS — R293 Abnormal posture: Secondary | ICD-10-CM | POA: Diagnosis not present

## 2022-01-08 DIAGNOSIS — Z170 Estrogen receptor positive status [ER+]: Secondary | ICD-10-CM | POA: Diagnosis not present

## 2022-01-08 DIAGNOSIS — C50212 Malignant neoplasm of upper-inner quadrant of left female breast: Secondary | ICD-10-CM | POA: Insufficient documentation

## 2022-01-09 NOTE — Progress Notes (Signed)
Location of Breast Cancer: Stage IA (T1aN0M0) infiltrating ductal carcinoma of the LEFT breast     Histology per Pathology Report:   FINAL MICROSCOPIC DIAGNOSIS:   A. BREAST, LEFT, LUMPECTOMY:  -  Invasive ductal carcinoma, Nottingham grade 1 of 3, 0.5 cm  -  Ductal carcinoma in-situ, intermediate grade  -  Calcifications associated with carcinoma  -  Margins uninvolved by carcinoma (see parts B-D)  -  Intraductal papillomas and fibrocystic changes with apocrine  metaplasia  -  Previous biopsy site changes present (x2)  -  See oncology table and comment below   B. BREAST, LEFT MEDIAL MARGIN, EXCISION:  -  No residual carcinoma identified   C. BREAST, LEFT INFERIOR MARGIN, EXCISION:  -  Residual ductal carcinoma in situ partially involving a complex  sclerosing lesion  -  Margins uninvolved by carcinoma (<0.1 cm)   D. BREAST, LEFT LATERAL MARGIN, EXCISION:  -  Focal residual ductal carcinoma in situ  -  Margins uninvolved by carcinoma (<0.1 cm)   E. LYMPH NODE, LEFT AXILLARY, SENTINEL, EXCISION:  -  No carcinoma identified in one lymph node (0/1)  -  See comment   F. LYMPH NODE, LEFT AXILLARY, SENTINEL, EXCISION:  -  Isolated tumor cells identified in one lymph node (0i+/1)  -  See comment   Receptor Status: ER(100%), PR (60%), Her2-neu (negative), Ki-(<5%)  Did patient present with symptoms (if so, please note symptoms) or was this found on screening mammography?: screening mammography  Past/Anticipated interventions by surgeon, if any: Left breast seed localized lumpectomy utilizing 2 seeds for both cancer and papilloma and left axillary sentinel lymph node mapping with mag trace by Dr. Brantley Stage on 11/27/2021.  Past/Anticipated interventions by medical oncology, if any: Femara 2.5 mg p.o. daily -start after XRT  Lymphedema issues, if any:  {:18581} {t:21944}   Pain issues, if any:  {:18581} {PAIN DESCRIPTION:21022940}  SAFETY ISSUES: Prior radiation?  {:18581} Pacemaker/ICD? {:18581} Possible current pregnancy?{:18581} Is the patient on methotrexate? {:18581}  Current Complaints / other details:  ***    Jacqulyn Liner, RN 01/09/2022,10:41 AM

## 2022-01-10 NOTE — Progress Notes (Signed)
Radiation Oncology         [phone] ________________________________  Initial Outpatient Consultation  Name: Laura Patrick MRN: [id number]  Date: 01/13/2022  DOB: 1956-03-06  WU:[id number], Aundra Millet, MD  Volanda Napoleon, MD   REFERRING PHYSICIAN: Volanda Napoleon, MD  DIAGNOSIS: There were no encounter diagnoses.  Stage *** (***) Left Breast UIQ, Invasive and in-situ ductal carcinoma, ER+ / PR+ / Her2-, Grade 1   Cancer Staging  Stage I breast cancer in female [facility]) Staging form: Breast, AJCC 8th Edition - Clinical stage from 12/27/2021: Stage IA (cT1a, cN0, cM0, G2, ER+, PR+, HER2-) - Signed by Volanda Napoleon, MD on 12/27/2021  HISTORY OF PRESENT ILLNESS::Laura Patrick is a 66 y.o. female who is accompanied by ***. she is seen as a courtesy of Dr. Marin Olp for an opinion concerning radiation therapy as part of management for her recently diagnosed left breast cancer.   The patient presented for a routine screening mammogram on 09/30/21 which revealed a possible abnormality in the left breast. Diagnostic left breast mammogram and left breast ultrasound on 10/01/21 further revealed: a suspicious 1 cm mass in the left breast at the 11 o'clock position, 6 cmfn, likely corresponding with the distortion seen on mammography; an indeterminate 0.8 cm mass in the left breast at the 12 o'clock position 4 cmfn; and an indeterminate 0.5 cm mass in the left breast 12 o'clock position, 6 cmfn. No left axillary adenopathy was appreciated.   Biopsy of the 11 o'clock left breast, 6 cmfn on 10/08/21 revealed grade 2 invasive mammary carcinoma measuring 0.2 cm in the greatest linear dimension with intermediate grade ductal carcinoma in-situ with calcifications. Prognostic indicators significant for: estrogen receptor 100% positive and progesterone receptor 60% positive, both with strong staining intensity; Proliferation marker Ki67 at <5%; Her2 status negative; Grade 2.  Biopsies of the two 12  o'clock masses showed no evidence of malignancy, with fibrocystic changes in the 12 o'clock mass 4cmfn, and a sclerosing papillary lesion in the 12 o'clock mass 6 cmfn.   The patient was accordingly referred to Dr. Brantley Stage and opted to proceed with left breast lumpectomy with nodal biopsies on 11/27/21. Pathology from the procedure revealed: grade 1 invasive ductal carcinoma measuring 0.5 cm and intermediate grade ductal carcinoma in-situ with intraductal papillomas and fibrocystic changes with apocrine  Metaplasia. All margins negative for both invasive and in-situ carcinoma. Nodal status of 2/2 left axillary SLN excisions negative for carcinoma, with one lymph node with isolated tumor cells identified in one SLN. Prognostic indicators significant for: estrogen receptor 100% positive and progesterone receptor 60% positive, both with strong staining intensity; Proliferation marker Ki67 at <5%; Her2 status negative; Grade 1.   Oncotype DX was obtained on the final surgical sample and the recurrence score of 15 predicts a risk of recurrence outside the breast over the next 9 years of 14%, if the patient's only systemic therapy is an antiestrogen for 5 years. It also predicts no significant benefit from chemotherapy.  The patient has met with Dr. Marin Olp and will return to him following XRT to begin hormonal therapy consisting of femara.     PREVIOUS RADIATION THERAPY: No  PAST MEDICAL HISTORY:  Past Medical History:  Diagnosis Date   Anemia    Cancer ([place]) 10/2021   left breast IDC   Hypertension    Ovarian cyst    Stage I breast cancer in female [facility]) 12/27/2021    PAST SURGICAL HISTORY: Past Surgical History:  Procedure Laterality  Date   BREAST LUMPECTOMY WITH RADIOACTIVE SEED AND SENTINEL LYMPH NODE BIOPSY Left 11/27/2021   Procedure: LEFT BREAST LUMPECTOMY WITH RADIOACTIVE SEEDS X3 AND SENTINEL LYMPH NODE BIOPSY;  Surgeon: Erroll Luna, MD;  Location: [facility];   Service: General;  Laterality: Left;   COLONOSCOPY     OOPHORECTOMY Right 1988   OVARIAN CYST REMOVAL     TOE SURGERY      FAMILY HISTORY:  Family History  Problem Relation Age of Onset   Heart disease Mother        CHF   Diabetes Mother    Hypertension Father    Diabetes Father    Stroke Father    Breast cancer Maternal Grandmother    Hypertension Maternal Grandmother    Stroke Maternal Grandmother    Stroke Maternal Grandfather    Hypertension Maternal Grandfather    Breast cancer Paternal Grandmother    Stroke Paternal Grandfather    Hypertension Paternal Grandfather    Colon cancer Neg Hx    Colon polyps Neg Hx    Esophageal cancer Neg Hx    Rectal cancer Neg Hx    Stomach cancer Neg Hx     SOCIAL HISTORY:  Social History   Tobacco Use   Smoking status: Never   Smokeless tobacco: Never  Vaping Use   Vaping Use: Never used  Substance Use Topics   Alcohol use: No   Drug use: No    ALLERGIES:  Allergies  Allergen Reactions   Bee Venom Swelling and Other (See Comments)    Both elbows to the hands, swells, and red water like blisters appear.    Amoxicillin Rash    MEDICATIONS:  Current Outpatient Medications  Medication Sig Dispense Refill   amLODipine (NORVASC) 10 MG tablet Take 1 tablet (10 mg total) by mouth daily. 90 tablet 0   oxyCODONE (OXY IR/ROXICODONE) 5 MG immediate release tablet Take 1 tablet (5 mg total) by mouth every 6 (six) hours as needed for severe pain. (Patient not taking: Reported on 12/27/2021) 15 tablet 0   potassium chloride SA (KLOR-CON M) 20 MEQ tablet Take 1 tablet (20 mEq total) by mouth daily. 90 tablet 0   valsartan-hydrochlorothiazide (DIOVAN-HCT) 320-12.5 MG tablet Take 1 tablet by mouth daily. (Patient not taking: Reported on 12/27/2021) 90 tablet 0   No current facility-administered medications for this encounter.    REVIEW OF SYSTEMS:  A 10+ POINT REVIEW OF SYSTEMS WAS OBTAINED including neurology, dermatology, psychiatry,  cardiac, respiratory, lymph, extremities, GI, GU, musculoskeletal, constitutional, reproductive, HEENT. ***   PHYSICAL EXAM:  vitals were not taken for this visit.   General: Alert and oriented, in no acute distress HEENT: Head is normocephalic. Extraocular movements are intact. Oropharynx is clear. Neck: Neck is supple, no palpable cervical or supraclavicular lymphadenopathy. Heart: Regular in rate and rhythm with no murmurs, rubs, or gallops. Chest: Clear to auscultation bilaterally, with no rhonchi, wheezes, or rales. Abdomen: Soft, nontender, nondistended, with no rigidity or guarding. Extremities: No cyanosis or edema. Lymphatics: see Neck Exam Skin: No concerning lesions. Musculoskeletal: symmetric strength and muscle tone throughout. Neurologic: Cranial nerves II through XII are grossly intact. No obvious focalities. Speech is fluent. Coordination is intact. Psychiatric: Judgment and insight are intact. Affect is appropriate.  Right Breast: no palpable mass, nipple discharge or bleeding. Left Breast: ***  ECOG = ***  0 - Asymptomatic (Fully active, able to carry on all predisease activities without restriction)  1 - Symptomatic but completely ambulatory (Restricted  in physically strenuous activity but ambulatory and able to carry out work of a light or sedentary nature. For example, light housework, office work)  2 - Symptomatic, <50% in bed during the day (Ambulatory and capable of all self care but unable to carry out any work activities. Up and about more than 50% of waking hours)  3 - Symptomatic, >50% in bed, but not bedbound (Capable of only limited self-care, confined to bed or chair 50% or more of waking hours)  4 - Bedbound (Completely disabled. Cannot carry on any self-care. Totally confined to bed or chair)  5 - Death   Eustace Pen MM, Creech RH, Tormey DC, et al. [phone]). "Toxicity and response criteria of the [facility]". [place] Oncol. 5 (6):  649-55  LABORATORY DATA:  Lab Results  Component Value Date   WBC 4.2 12/27/2021   HGB 13.9 12/27/2021   HCT 42.4 12/27/2021   MCV 86.9 12/27/2021   PLT 265 12/27/2021   NEUTROABS 2.0 12/27/2021   Lab Results  Component Value Date   NA 140 12/27/2021   K 4.2 12/27/2021   CL 102 12/27/2021   CO2 30 12/27/2021   GLUCOSE 101 (H) 12/27/2021   BUN 11 12/27/2021   CREATININE 0.90 12/27/2021   CALCIUM 10.4 (H) 12/27/2021      RADIOGRAPHY: No results found.    IMPRESSION: Stage *** (***) Left Breast UIQ, Invasive and in-situ ductal carcinoma, ER+ / PR+ / Her2-, Grade 1  ***  Today, I talked to the patient and family about the findings and work-up thus far.  We discussed the natural history of *** and general treatment, highlighting the role of radiotherapy in the management.  We discussed the available radiation techniques, and focused on the details of logistics and delivery.  We reviewed the anticipated acute and late sequelae associated with radiation in this setting.  The patient was encouraged to ask questions that I answered to the best of my ability. *** A patient consent form was discussed and signed.  We retained a copy for our records.  The patient would like to proceed with radiation and will be scheduled for CT simulation.  PLAN: ***    *** minutes of total time was spent for this patient encounter, including preparation, face-to-face counseling with the patient and coordination of care, physical exam, and documentation of the encounter.   ------------------------------------------------  Blair Promise, PhD, MD  This document serves as a record of services personally performed by Gery Pray, MD. It was created on his behalf by Roney Mans, a trained medical scribe. The creation of this record is based on the scribe's personal observations and the provider's statements to them. This document has been checked and approved by the attending provider.

## 2022-01-13 ENCOUNTER — Other Ambulatory Visit: Payer: Self-pay

## 2022-01-13 ENCOUNTER — Encounter: Payer: Self-pay | Admitting: Radiation Oncology

## 2022-01-13 ENCOUNTER — Ambulatory Visit
Admission: RE | Admit: 2022-01-13 | Discharge: 2022-01-13 | Disposition: A | Discharge status: Home / Self Care | Payer: 59 | Source: Ambulatory Visit | Attending: Radiation Oncology | Admitting: Radiation Oncology

## 2022-01-13 VITALS — BP 142/76 | HR 87 | Temp 97.2°F | Resp 18 | Ht 60.0 in | Wt 189.5 lb

## 2022-01-13 DIAGNOSIS — C50212 Malignant neoplasm of upper-inner quadrant of left female breast: Secondary | ICD-10-CM | POA: Diagnosis not present

## 2022-01-13 DIAGNOSIS — I10 Essential (primary) hypertension: Secondary | ICD-10-CM | POA: Diagnosis not present

## 2022-01-13 DIAGNOSIS — Z809 Family history of malignant neoplasm, unspecified: Secondary | ICD-10-CM | POA: Diagnosis not present

## 2022-01-13 DIAGNOSIS — Z803 Family history of malignant neoplasm of breast: Secondary | ICD-10-CM | POA: Diagnosis not present

## 2022-01-13 DIAGNOSIS — C50919 Malignant neoplasm of unspecified site of unspecified female breast: Secondary | ICD-10-CM

## 2022-01-13 DIAGNOSIS — Z79899 Other long term (current) drug therapy: Secondary | ICD-10-CM | POA: Diagnosis not present

## 2022-01-13 DIAGNOSIS — Z170 Estrogen receptor positive status [ER+]: Secondary | ICD-10-CM | POA: Diagnosis not present

## 2022-01-13 DIAGNOSIS — D649 Anemia, unspecified: Secondary | ICD-10-CM | POA: Diagnosis not present

## 2022-01-14 ENCOUNTER — Encounter: Payer: Self-pay | Admitting: *Deleted

## 2022-01-14 DIAGNOSIS — C50212 Malignant neoplasm of upper-inner quadrant of left female breast: Secondary | ICD-10-CM | POA: Diagnosis not present

## 2022-01-14 DIAGNOSIS — Z170 Estrogen receptor positive status [ER+]: Secondary | ICD-10-CM | POA: Diagnosis not present

## 2022-01-14 NOTE — Progress Notes (Signed)
Patient was seen by RadOnc and is scheduled for simulation on 01/16/22. She will then be scheduled for approx 4 weeks of treatment.   Oncology Nurse Navigator Documentation     01/14/2022    8:30 AM  Oncology Nurse Navigator Flowsheets  Navigator Follow Up Date: 01/31/2022  Navigator Follow Up Reason: Follow-up Appointment  Navigator Location [facility]  Navigator Encounter Type Appt/Treatment Plan Review  Patient Visit Type MedOnc  Treatment Phase Active Tx  Barriers/Navigation Needs Coordination of Care;Education  Interventions None Required  Acuity Level 2-Minimal Needs (1-2 Barriers Identified)  Support Groups/Services Friends and Family  Time Spent with Patient 15

## 2022-01-16 ENCOUNTER — Other Ambulatory Visit: Payer: Self-pay

## 2022-01-16 ENCOUNTER — Ambulatory Visit
Admission: RE | Admit: 2022-01-16 | Discharge: 2022-01-16 | Disposition: A | Discharge status: Home / Self Care | Payer: 59 | Source: Ambulatory Visit | Attending: Radiation Oncology | Admitting: Radiation Oncology

## 2022-01-16 DIAGNOSIS — C50212 Malignant neoplasm of upper-inner quadrant of left female breast: Secondary | ICD-10-CM | POA: Diagnosis not present

## 2022-01-16 DIAGNOSIS — Z510 Encounter for antineoplastic radiation therapy: Secondary | ICD-10-CM | POA: Insufficient documentation

## 2022-01-16 DIAGNOSIS — Z170 Estrogen receptor positive status [ER+]: Secondary | ICD-10-CM | POA: Diagnosis not present

## 2022-01-16 DIAGNOSIS — C50919 Malignant neoplasm of unspecified site of unspecified female breast: Secondary | ICD-10-CM

## 2022-01-23 DIAGNOSIS — Z170 Estrogen receptor positive status [ER+]: Secondary | ICD-10-CM | POA: Diagnosis not present

## 2022-01-23 DIAGNOSIS — Z510 Encounter for antineoplastic radiation therapy: Secondary | ICD-10-CM | POA: Diagnosis not present

## 2022-01-23 DIAGNOSIS — C50212 Malignant neoplasm of upper-inner quadrant of left female breast: Secondary | ICD-10-CM | POA: Diagnosis not present

## 2022-01-24 DIAGNOSIS — C50912 Malignant neoplasm of unspecified site of left female breast: Secondary | ICD-10-CM | POA: Diagnosis not present

## 2022-01-28 ENCOUNTER — Ambulatory Visit: Payer: 59

## 2022-01-28 ENCOUNTER — Ambulatory Visit
Admission: RE | Admit: 2022-01-28 | Discharge: 2022-01-28 | Disposition: A | Discharge status: Home / Self Care | Payer: 59 | Source: Ambulatory Visit | Attending: Radiation Oncology | Admitting: Radiation Oncology

## 2022-01-28 ENCOUNTER — Ambulatory Visit: Payer: 59 | Admitting: Radiation Oncology

## 2022-01-28 ENCOUNTER — Other Ambulatory Visit: Payer: Self-pay

## 2022-01-28 DIAGNOSIS — Z170 Estrogen receptor positive status [ER+]: Secondary | ICD-10-CM | POA: Diagnosis not present

## 2022-01-28 DIAGNOSIS — C50212 Malignant neoplasm of upper-inner quadrant of left female breast: Secondary | ICD-10-CM | POA: Diagnosis not present

## 2022-01-28 DIAGNOSIS — C50919 Malignant neoplasm of unspecified site of unspecified female breast: Secondary | ICD-10-CM

## 2022-01-28 DIAGNOSIS — Z510 Encounter for antineoplastic radiation therapy: Secondary | ICD-10-CM | POA: Diagnosis not present

## 2022-01-28 LAB — RAD ONC ARIA SESSION SUMMARY
Course Elapsed Days: 0
Plan Fractions Treated to Date: 1
Plan Prescribed Dose Per Fraction: 2.67 Gy
Plan Total Fractions Prescribed: 15
Plan Total Prescribed Dose: 40.05 Gy
Reference Point Dosage Given to Date: 2.67 Gy
Reference Point Session Dosage Given: 2.67 Gy
Session Number: 1

## 2022-01-28 MED ORDER — ALRA NON-METALLIC DEODORANT (RAD-ONC)
1.0000 | Freq: Once | TOPICAL | Status: AC
  Administered 2022-01-28: 1 via TOPICAL

## 2022-01-28 MED ORDER — RADIAPLEXRX EX GEL: Freq: Once | CUTANEOUS | Status: AC

## 2022-01-29 ENCOUNTER — Other Ambulatory Visit: Payer: Self-pay

## 2022-01-29 ENCOUNTER — Ambulatory Visit: Payer: 59

## 2022-01-29 ENCOUNTER — Ambulatory Visit
Admission: RE | Admit: 2022-01-29 | Discharge: 2022-01-29 | Disposition: A | Discharge status: Home / Self Care | Payer: 59 | Source: Ambulatory Visit | Attending: Radiation Oncology | Admitting: Radiation Oncology

## 2022-01-29 DIAGNOSIS — C50212 Malignant neoplasm of upper-inner quadrant of left female breast: Secondary | ICD-10-CM | POA: Diagnosis not present

## 2022-01-29 DIAGNOSIS — Z510 Encounter for antineoplastic radiation therapy: Secondary | ICD-10-CM | POA: Diagnosis not present

## 2022-01-29 DIAGNOSIS — Z170 Estrogen receptor positive status [ER+]: Secondary | ICD-10-CM | POA: Diagnosis not present

## 2022-01-29 LAB — RAD ONC ARIA SESSION SUMMARY
Course Elapsed Days: 1
Plan Fractions Treated to Date: 2
Plan Prescribed Dose Per Fraction: 2.67 Gy
Plan Total Fractions Prescribed: 15
Plan Total Prescribed Dose: 40.05 Gy
Reference Point Dosage Given to Date: 5.34 Gy
Reference Point Session Dosage Given: 2.67 Gy
Session Number: 2

## 2022-01-30 ENCOUNTER — Ambulatory Visit: Payer: 59

## 2022-01-30 ENCOUNTER — Encounter: Payer: Self-pay | Admitting: Hematology & Oncology

## 2022-01-30 ENCOUNTER — Other Ambulatory Visit: Payer: Self-pay

## 2022-01-30 ENCOUNTER — Inpatient Hospital Stay: Payer: 59 | Admitting: Hematology & Oncology

## 2022-01-30 ENCOUNTER — Ambulatory Visit
Admission: RE | Admit: 2022-01-30 | Discharge: 2022-01-30 | Disposition: A | Discharge status: Home / Self Care | Payer: 59 | Source: Ambulatory Visit | Attending: Radiation Oncology | Admitting: Radiation Oncology

## 2022-01-30 ENCOUNTER — Encounter: Payer: Self-pay | Admitting: *Deleted

## 2022-01-30 ENCOUNTER — Inpatient Hospital Stay: Payer: 59 | Attending: Hematology & Oncology

## 2022-01-30 VITALS — BP 124/72 | HR 61 | Temp 98.2°F | Resp 18 | Ht 60.0 in | Wt 188.0 lb

## 2022-01-30 DIAGNOSIS — Z91030 Bee allergy status: Secondary | ICD-10-CM | POA: Insufficient documentation

## 2022-01-30 DIAGNOSIS — Z79899 Other long term (current) drug therapy: Secondary | ICD-10-CM | POA: Diagnosis not present

## 2022-01-30 DIAGNOSIS — C50212 Malignant neoplasm of upper-inner quadrant of left female breast: Secondary | ICD-10-CM | POA: Diagnosis not present

## 2022-01-30 DIAGNOSIS — Z170 Estrogen receptor positive status [ER+]: Secondary | ICD-10-CM | POA: Insufficient documentation

## 2022-01-30 DIAGNOSIS — Z79811 Long term (current) use of aromatase inhibitors: Secondary | ICD-10-CM | POA: Diagnosis not present

## 2022-01-30 DIAGNOSIS — C50919 Malignant neoplasm of unspecified site of unspecified female breast: Secondary | ICD-10-CM

## 2022-01-30 DIAGNOSIS — C50019 Malignant neoplasm of nipple and areola, unspecified female breast: Secondary | ICD-10-CM

## 2022-01-30 DIAGNOSIS — Z510 Encounter for antineoplastic radiation therapy: Secondary | ICD-10-CM | POA: Diagnosis not present

## 2022-01-30 DIAGNOSIS — Z880 Allergy status to penicillin: Secondary | ICD-10-CM | POA: Diagnosis not present

## 2022-01-30 LAB — CBC WITH DIFFERENTIAL (CANCER CENTER ONLY)
Abs Immature Granulocytes: 0.01 10*3/uL (ref 0.00–0.07)
Basophils Absolute: 0 10*3/uL (ref 0.0–0.1)
Basophils Relative: 1 %
Eosinophils Absolute: 0.1 10*3/uL (ref 0.0–0.5)
Eosinophils Relative: 2 %
HCT: 42.3 % (ref 36.0–46.0)
Hemoglobin: 13.9 g/dL (ref 12.0–15.0)
Immature Granulocytes: 0 %
Lymphocytes Relative: 47 %
Lymphs Abs: 1.7 10*3/uL (ref 0.7–4.0)
MCH: 28.1 pg (ref 26.0–34.0)
MCHC: 32.9 g/dL (ref 30.0–36.0)
MCV: 85.6 fL (ref 80.0–100.0)
Monocytes Absolute: 0.3 10*3/uL (ref 0.1–1.0)
Monocytes Relative: 9 %
Neutro Abs: 1.5 10*3/uL — ABNORMAL LOW (ref 1.7–7.7)
Neutrophils Relative %: 41 %
Platelet Count: 275 10*3/uL (ref 150–400)
RBC: 4.94 MIL/uL (ref 3.87–5.11)
RDW: 13.4 % (ref 11.5–15.5)
WBC Count: 3.7 10*3/uL — ABNORMAL LOW (ref 4.0–10.5)
nRBC: 0 % (ref 0.0–0.2)

## 2022-01-30 LAB — RAD ONC ARIA SESSION SUMMARY
Course Elapsed Days: 2
Plan Fractions Treated to Date: 3
Plan Prescribed Dose Per Fraction: 2.67 Gy
Plan Total Fractions Prescribed: 15
Plan Total Prescribed Dose: 40.05 Gy
Reference Point Dosage Given to Date: 8.01 Gy
Reference Point Session Dosage Given: 2.67 Gy
Session Number: 3

## 2022-01-30 LAB — CMP (CANCER CENTER ONLY)
ALT: 11 U/L (ref 0–44)
AST: 11 U/L — ABNORMAL LOW (ref 15–41)
Albumin: 4.8 g/dL (ref 3.5–5.0)
Alkaline Phosphatase: 68 U/L (ref 38–126)
Anion gap: 6 (ref 5–15)
BUN: 10 mg/dL (ref 8–23)
CO2: 27 mmol/L (ref 22–32)
Calcium: 10.2 mg/dL (ref 8.9–10.3)
Chloride: 105 mmol/L (ref 98–111)
Creatinine: 0.87 mg/dL (ref 0.44–1.00)
GFR, Estimated: >60 mL/min (ref >60)
Glucose, Bld: 97 mg/dL (ref 70–99)
Potassium: 4.1 mmol/L (ref 3.5–5.1)
Sodium: 138 mmol/L (ref 135–145)
Total Bilirubin: 0.4 mg/dL (ref 0.3–1.2)
Total Protein: 8.4 g/dL — ABNORMAL HIGH (ref 6.5–8.1)

## 2022-01-30 LAB — VITAMIN D 25 HYDROXY (VIT D DEFICIENCY, FRACTURES): Vit D, 25-Hydroxy: 35.52 ng/mL (ref 30–100)

## 2022-01-30 LAB — LACTATE DEHYDROGENASE: LDH: 162 U/L (ref 98–192)

## 2022-01-30 NOTE — Progress Notes (Signed)
Patient has started on radiation. She will return once finished with radiation to start AI.  Oncology Nurse Navigator Documentation     01/30/2022    1:30 PM  Oncology Nurse Navigator Flowsheets  Phase of Treatment Radiation  Radiation Actual Start Date: 01/28/2022  Radiation Expected End Date: 02/26/2022  Navigator Follow Up Date: 03/07/2022  Navigator Follow Up Reason: Follow-up Appointment  Navigator Location [facility]  Navigator Encounter Type Appt/Treatment Plan Review  Patient Visit Type MedOnc  Treatment Phase Active Tx  Barriers/Navigation Needs Coordination of Care;Education  Interventions None Required  Acuity Level 2-Minimal Needs (1-2 Barriers Identified)  Support Groups/Services Friends and Family  Time Spent with Patient 15

## 2022-01-30 NOTE — Progress Notes (Signed)
Hematology and Oncology Follow Up Visit  Laura Patrick [id number] 1955-09-26 66 y.o. 01/30/2022   Principle Diagnosis:  Stage IA (T1aN0M0) infiltrating ductal carcinoma of the LEFT breast   --ER+/PR+/HER2- -- Oncotype =15  Current Therapy:   Status post left lumpectomy on 11/27/2021 Radiation therapy --start in 01/21/2022 Femara 2.5 mg p.o. daily -start after XRT     Interim History:  Laura Patrick is back for her follow-up.  She is doing quite well.  She started radiation therapy this week.  So far, she is done well with the radiation.  I realize she has only had 3 treatments.  I think she gets a total of 20.  She has had no problems with fatigue or weakness.  There is been no issues with cough or shortness of breath.  She has had no bleeding.  There is been no nausea or vomiting.  She has had no arm or leg swelling.  There is been no lymphedema on the left side.  Overall, I would say her performance status for now is ECOG 0.     Medications:  Current Outpatient Medications:    amLODipine (NORVASC) 10 MG tablet, Take 1 tablet (10 mg total) by mouth daily., Disp: 90 tablet, Rfl: 0   potassium chloride SA (KLOR-CON M) 20 MEQ tablet, Take 1 tablet (20 mEq total) by mouth daily., Disp: 90 tablet, Rfl: 0   valsartan-hydrochlorothiazide (DIOVAN-HCT) 320-12.5 MG tablet, Take 1 tablet by mouth daily., Disp: 90 tablet, Rfl: 0  Allergies:  Allergies  Allergen Reactions   Bee Venom Swelling and Other (See Comments)    Both elbows to the hands, swells, and red water like blisters appear.    Amoxicillin Rash    Past Medical History, Surgical history, Social history, and Family History were reviewed and updated.  Review of Systems: Review of Systems  Constitutional: Negative.   HENT:  Negative.    Eyes: Negative.   Respiratory: Negative.    Cardiovascular: Negative.   Gastrointestinal: Negative.   Endocrine: Negative.   Genitourinary: Negative.    Musculoskeletal: Negative.    Skin: Negative.   Neurological: Negative.   Hematological: Negative.   Psychiatric/Behavioral: Negative.      Physical Exam:  height is 5' (1.524 m) and weight is 188 lb (85.3 kg). Her oral temperature is 98.2 F (36.8 C). Her blood pressure is 124/72 and her pulse is 61. Her respiration is 18 and oxygen saturation is 99%.   Wt Readings from Last 3 Encounters:  01/30/22 188 lb (85.3 kg)  01/13/22 189 lb 8 oz (86 kg)  12/27/21 186 lb 6.4 oz (84.6 kg)    Physical Exam Vitals reviewed.  Constitutional:      Comments: Breast exam shows right breast with no masses, edema or erythema.  There is no right axillary adenopathy.  Left breast shows a well-healed lumpectomy.  This is at the 12 o'clock position.  There is no erythema or warmth of the left breast.  She has a healing left lymphadenectomy scar.  HENT:     Head: Normocephalic and atraumatic.  Eyes:     Pupils: Pupils are equal, round, and reactive to light.  Cardiovascular:     Rate and Rhythm: Normal rate and regular rhythm.     Heart sounds: Normal heart sounds.  Pulmonary:     Effort: Pulmonary effort is normal.     Breath sounds: Normal breath sounds.  Abdominal:     General: Bowel sounds are normal.     Palpations:  Abdomen is soft.  Musculoskeletal:        General: No tenderness or deformity. Normal range of motion.     Cervical back: Normal range of motion.  Lymphadenopathy:     Cervical: No cervical adenopathy.  Skin:    General: Skin is warm and dry.     Findings: No erythema or rash.  Neurological:     Mental Status: She is alert and oriented to person, place, and time.  Psychiatric:        Behavior: Behavior normal.        Thought Content: Thought content normal.        Judgment: Judgment normal.      Lab Results  Component Value Date   WBC 3.7 (L) 01/30/2022   HGB 13.9 01/30/2022   HCT 42.3 01/30/2022   MCV 85.6 01/30/2022   PLT 275 01/30/2022     Chemistry      Component Value Date/Time   NA  138 01/30/2022 1143   NA 141 01/03/2019 0836   K 4.1 01/30/2022 1143   CL 105 01/30/2022 1143   CO2 27 01/30/2022 1143   BUN 10 01/30/2022 1143   BUN 15 01/03/2019 0836   CREATININE 0.87 01/30/2022 1143   CREATININE 0.91 10/06/2016 1529      Component Value Date/Time   CALCIUM 10.2 01/30/2022 1143   ALKPHOS 68 01/30/2022 1143   AST 11 (L) 01/30/2022 1143   ALT 11 01/30/2022 1143   BILITOT 0.4 01/30/2022 1143      Impression and Plan: Laura Patrick is a very nice 66 year old postmenopausal African-American female.  She has a stage Ia infiltrating ductal carcinoma of the LEFT breast.  This has very good prognostic markers.  The Oncotype score is only 15.  She is getting radiation right now.  She will finish up in July.  After she completes the radiation, then we will start her on Femara.  I am just happy that everything is going well.  I would think that with our protocol, the risk of recurrence should easily be less than 5-10%.  I think we will plan to get her back now in about 6 weeks or so.  By then, I think she would have started the Femara.    Volanda Napoleon, MD 6/29/20231:05 PM

## 2022-01-31 ENCOUNTER — Ambulatory Visit: Payer: 59 | Admitting: Hematology & Oncology

## 2022-01-31 ENCOUNTER — Ambulatory Visit: Payer: 59

## 2022-01-31 ENCOUNTER — Ambulatory Visit
Admission: RE | Admit: 2022-01-31 | Discharge: 2022-01-31 | Disposition: A | Discharge status: Home / Self Care | Payer: 59 | Source: Ambulatory Visit | Attending: Radiation Oncology | Admitting: Radiation Oncology

## 2022-01-31 ENCOUNTER — Other Ambulatory Visit: Payer: Self-pay

## 2022-01-31 ENCOUNTER — Inpatient Hospital Stay: Payer: 59

## 2022-01-31 DIAGNOSIS — Z510 Encounter for antineoplastic radiation therapy: Secondary | ICD-10-CM | POA: Diagnosis not present

## 2022-01-31 DIAGNOSIS — Z170 Estrogen receptor positive status [ER+]: Secondary | ICD-10-CM | POA: Diagnosis not present

## 2022-01-31 DIAGNOSIS — C50212 Malignant neoplasm of upper-inner quadrant of left female breast: Secondary | ICD-10-CM | POA: Diagnosis not present

## 2022-01-31 LAB — RAD ONC ARIA SESSION SUMMARY
Course Elapsed Days: 3
Plan Fractions Treated to Date: 4
Plan Prescribed Dose Per Fraction: 2.67 Gy
Plan Total Fractions Prescribed: 15
Plan Total Prescribed Dose: 40.05 Gy
Reference Point Dosage Given to Date: 10.68 Gy
Reference Point Session Dosage Given: 2.67 Gy
Session Number: 4

## 2022-02-03 ENCOUNTER — Ambulatory Visit: Payer: 59

## 2022-02-03 ENCOUNTER — Other Ambulatory Visit: Payer: Self-pay

## 2022-02-03 ENCOUNTER — Ambulatory Visit
Admission: RE | Admit: 2022-02-03 | Discharge: 2022-02-03 | Disposition: A | Discharge status: Home / Self Care | Payer: 59 | Source: Ambulatory Visit | Attending: Radiation Oncology | Admitting: Radiation Oncology

## 2022-02-03 DIAGNOSIS — Z170 Estrogen receptor positive status [ER+]: Secondary | ICD-10-CM | POA: Diagnosis not present

## 2022-02-03 DIAGNOSIS — C50212 Malignant neoplasm of upper-inner quadrant of left female breast: Secondary | ICD-10-CM | POA: Diagnosis not present

## 2022-02-03 DIAGNOSIS — C50919 Malignant neoplasm of unspecified site of unspecified female breast: Secondary | ICD-10-CM | POA: Insufficient documentation

## 2022-02-03 DIAGNOSIS — Z510 Encounter for antineoplastic radiation therapy: Secondary | ICD-10-CM | POA: Diagnosis not present

## 2022-02-03 LAB — RAD ONC ARIA SESSION SUMMARY
Course Elapsed Days: 6
Plan Fractions Treated to Date: 5
Plan Prescribed Dose Per Fraction: 2.67 Gy
Plan Total Fractions Prescribed: 15
Plan Total Prescribed Dose: 40.05 Gy
Reference Point Dosage Given to Date: 13.35 Gy
Reference Point Session Dosage Given: 2.67 Gy
Session Number: 5

## 2022-02-05 ENCOUNTER — Ambulatory Visit: Payer: 59

## 2022-02-05 ENCOUNTER — Other Ambulatory Visit: Payer: Self-pay

## 2022-02-05 ENCOUNTER — Ambulatory Visit
Admission: RE | Admit: 2022-02-05 | Discharge: 2022-02-05 | Disposition: A | Discharge status: Home / Self Care | Payer: 59 | Source: Ambulatory Visit | Attending: Radiation Oncology | Admitting: Radiation Oncology

## 2022-02-05 DIAGNOSIS — Z510 Encounter for antineoplastic radiation therapy: Secondary | ICD-10-CM | POA: Diagnosis not present

## 2022-02-05 DIAGNOSIS — C50919 Malignant neoplasm of unspecified site of unspecified female breast: Secondary | ICD-10-CM | POA: Diagnosis not present

## 2022-02-05 DIAGNOSIS — Z170 Estrogen receptor positive status [ER+]: Secondary | ICD-10-CM | POA: Diagnosis not present

## 2022-02-05 DIAGNOSIS — C50212 Malignant neoplasm of upper-inner quadrant of left female breast: Secondary | ICD-10-CM | POA: Diagnosis not present

## 2022-02-05 LAB — RAD ONC ARIA SESSION SUMMARY
Course Elapsed Days: 8
Plan Fractions Treated to Date: 6
Plan Prescribed Dose Per Fraction: 2.67 Gy
Plan Total Fractions Prescribed: 15
Plan Total Prescribed Dose: 40.05 Gy
Reference Point Dosage Given to Date: 16.02 Gy
Reference Point Session Dosage Given: 2.67 Gy
Session Number: 6

## 2022-02-06 ENCOUNTER — Ambulatory Visit: Payer: 59

## 2022-02-06 ENCOUNTER — Other Ambulatory Visit: Payer: Self-pay

## 2022-02-06 ENCOUNTER — Ambulatory Visit
Admission: RE | Admit: 2022-02-06 | Discharge: 2022-02-06 | Disposition: A | Discharge status: Home / Self Care | Payer: 59 | Source: Ambulatory Visit | Attending: Radiation Oncology | Admitting: Radiation Oncology

## 2022-02-06 DIAGNOSIS — Z170 Estrogen receptor positive status [ER+]: Secondary | ICD-10-CM | POA: Diagnosis not present

## 2022-02-06 DIAGNOSIS — C50919 Malignant neoplasm of unspecified site of unspecified female breast: Secondary | ICD-10-CM | POA: Diagnosis not present

## 2022-02-06 DIAGNOSIS — C50212 Malignant neoplasm of upper-inner quadrant of left female breast: Secondary | ICD-10-CM | POA: Diagnosis not present

## 2022-02-06 DIAGNOSIS — Z510 Encounter for antineoplastic radiation therapy: Secondary | ICD-10-CM | POA: Diagnosis not present

## 2022-02-06 LAB — RAD ONC ARIA SESSION SUMMARY
Course Elapsed Days: 9
Plan Fractions Treated to Date: 7
Plan Prescribed Dose Per Fraction: 2.67 Gy
Plan Total Fractions Prescribed: 15
Plan Total Prescribed Dose: 40.05 Gy
Reference Point Dosage Given to Date: 18.69 Gy
Reference Point Session Dosage Given: 2.67 Gy
Session Number: 7

## 2022-02-07 ENCOUNTER — Ambulatory Visit: Payer: 59

## 2022-02-07 ENCOUNTER — Ambulatory Visit
Admission: RE | Admit: 2022-02-07 | Discharge: 2022-02-07 | Disposition: A | Discharge status: Home / Self Care | Payer: 59 | Source: Ambulatory Visit | Attending: Radiation Oncology | Admitting: Radiation Oncology

## 2022-02-07 ENCOUNTER — Other Ambulatory Visit: Payer: Self-pay

## 2022-02-07 DIAGNOSIS — C50919 Malignant neoplasm of unspecified site of unspecified female breast: Secondary | ICD-10-CM | POA: Diagnosis not present

## 2022-02-07 DIAGNOSIS — C50212 Malignant neoplasm of upper-inner quadrant of left female breast: Secondary | ICD-10-CM | POA: Diagnosis not present

## 2022-02-07 DIAGNOSIS — Z170 Estrogen receptor positive status [ER+]: Secondary | ICD-10-CM | POA: Diagnosis not present

## 2022-02-07 DIAGNOSIS — Z510 Encounter for antineoplastic radiation therapy: Secondary | ICD-10-CM | POA: Diagnosis not present

## 2022-02-07 LAB — RAD ONC ARIA SESSION SUMMARY
Course Elapsed Days: 10
Plan Fractions Treated to Date: 8
Plan Prescribed Dose Per Fraction: 2.67 Gy
Plan Total Fractions Prescribed: 15
Plan Total Prescribed Dose: 40.05 Gy
Reference Point Dosage Given to Date: 21.36 Gy
Reference Point Session Dosage Given: 2.67 Gy
Session Number: 8

## 2022-02-10 ENCOUNTER — Ambulatory Visit: Payer: 59

## 2022-02-11 ENCOUNTER — Ambulatory Visit: Payer: 59 | Admitting: Radiation Oncology

## 2022-02-11 ENCOUNTER — Ambulatory Visit
Admission: RE | Admit: 2022-02-11 | Discharge: 2022-02-11 | Disposition: A | Discharge status: Home / Self Care | Payer: 59 | Source: Ambulatory Visit | Attending: Radiation Oncology | Admitting: Radiation Oncology

## 2022-02-11 ENCOUNTER — Ambulatory Visit: Payer: 59

## 2022-02-11 ENCOUNTER — Other Ambulatory Visit: Payer: Self-pay

## 2022-02-11 DIAGNOSIS — Z170 Estrogen receptor positive status [ER+]: Secondary | ICD-10-CM | POA: Diagnosis not present

## 2022-02-11 DIAGNOSIS — C50919 Malignant neoplasm of unspecified site of unspecified female breast: Secondary | ICD-10-CM | POA: Diagnosis not present

## 2022-02-11 DIAGNOSIS — Z510 Encounter for antineoplastic radiation therapy: Secondary | ICD-10-CM | POA: Diagnosis not present

## 2022-02-11 DIAGNOSIS — C50212 Malignant neoplasm of upper-inner quadrant of left female breast: Secondary | ICD-10-CM | POA: Diagnosis not present

## 2022-02-11 LAB — RAD ONC ARIA SESSION SUMMARY
Course Elapsed Days: 14
Plan Fractions Treated to Date: 9
Plan Prescribed Dose Per Fraction: 2.67 Gy
Plan Total Fractions Prescribed: 15
Plan Total Prescribed Dose: 40.05 Gy
Reference Point Dosage Given to Date: 24.03 Gy
Reference Point Session Dosage Given: 2.67 Gy
Session Number: 9

## 2022-02-12 ENCOUNTER — Other Ambulatory Visit: Payer: Self-pay

## 2022-02-12 ENCOUNTER — Ambulatory Visit
Admission: RE | Admit: 2022-02-12 | Discharge: 2022-02-12 | Disposition: A | Discharge status: Home / Self Care | Payer: 59 | Source: Ambulatory Visit | Attending: Radiation Oncology | Admitting: Radiation Oncology

## 2022-02-12 ENCOUNTER — Ambulatory Visit: Payer: 59

## 2022-02-12 DIAGNOSIS — Z510 Encounter for antineoplastic radiation therapy: Secondary | ICD-10-CM | POA: Diagnosis not present

## 2022-02-12 DIAGNOSIS — C50212 Malignant neoplasm of upper-inner quadrant of left female breast: Secondary | ICD-10-CM | POA: Diagnosis not present

## 2022-02-12 DIAGNOSIS — Z170 Estrogen receptor positive status [ER+]: Secondary | ICD-10-CM | POA: Diagnosis not present

## 2022-02-12 DIAGNOSIS — C50919 Malignant neoplasm of unspecified site of unspecified female breast: Secondary | ICD-10-CM | POA: Diagnosis not present

## 2022-02-12 LAB — RAD ONC ARIA SESSION SUMMARY
Course Elapsed Days: 15
Plan Fractions Treated to Date: 10
Plan Prescribed Dose Per Fraction: 2.67 Gy
Plan Total Fractions Prescribed: 15
Plan Total Prescribed Dose: 40.05 Gy
Reference Point Dosage Given to Date: 26.7 Gy
Reference Point Session Dosage Given: 2.67 Gy
Session Number: 10

## 2022-02-13 ENCOUNTER — Ambulatory Visit: Payer: 59

## 2022-02-13 ENCOUNTER — Other Ambulatory Visit: Payer: Self-pay

## 2022-02-13 ENCOUNTER — Ambulatory Visit
Admission: RE | Admit: 2022-02-13 | Discharge: 2022-02-13 | Disposition: A | Discharge status: Home / Self Care | Payer: 59 | Source: Ambulatory Visit | Attending: Radiation Oncology | Admitting: Radiation Oncology

## 2022-02-13 DIAGNOSIS — Z170 Estrogen receptor positive status [ER+]: Secondary | ICD-10-CM | POA: Diagnosis not present

## 2022-02-13 DIAGNOSIS — C50212 Malignant neoplasm of upper-inner quadrant of left female breast: Secondary | ICD-10-CM | POA: Diagnosis not present

## 2022-02-13 DIAGNOSIS — Z510 Encounter for antineoplastic radiation therapy: Secondary | ICD-10-CM | POA: Diagnosis not present

## 2022-02-13 DIAGNOSIS — C50919 Malignant neoplasm of unspecified site of unspecified female breast: Secondary | ICD-10-CM | POA: Diagnosis not present

## 2022-02-13 LAB — RAD ONC ARIA SESSION SUMMARY
Course Elapsed Days: 16
Plan Fractions Treated to Date: 11
Plan Prescribed Dose Per Fraction: 2.67 Gy
Plan Total Fractions Prescribed: 15
Plan Total Prescribed Dose: 40.05 Gy
Reference Point Dosage Given to Date: 29.37 Gy
Reference Point Session Dosage Given: 2.67 Gy
Session Number: 11

## 2022-02-14 ENCOUNTER — Ambulatory Visit
Admission: RE | Admit: 2022-02-14 | Discharge: 2022-02-14 | Disposition: A | Discharge status: Home / Self Care | Payer: 59 | Source: Ambulatory Visit | Attending: Radiation Oncology | Admitting: Radiation Oncology

## 2022-02-14 ENCOUNTER — Other Ambulatory Visit: Payer: Self-pay

## 2022-02-14 ENCOUNTER — Ambulatory Visit: Payer: 59

## 2022-02-14 DIAGNOSIS — C50212 Malignant neoplasm of upper-inner quadrant of left female breast: Secondary | ICD-10-CM | POA: Diagnosis not present

## 2022-02-14 DIAGNOSIS — Z170 Estrogen receptor positive status [ER+]: Secondary | ICD-10-CM | POA: Diagnosis not present

## 2022-02-14 DIAGNOSIS — C50919 Malignant neoplasm of unspecified site of unspecified female breast: Secondary | ICD-10-CM | POA: Diagnosis not present

## 2022-02-14 DIAGNOSIS — Z510 Encounter for antineoplastic radiation therapy: Secondary | ICD-10-CM | POA: Diagnosis not present

## 2022-02-14 LAB — RAD ONC ARIA SESSION SUMMARY
Course Elapsed Days: 17
Plan Fractions Treated to Date: 12
Plan Prescribed Dose Per Fraction: 2.67 Gy
Plan Total Fractions Prescribed: 15
Plan Total Prescribed Dose: 40.05 Gy
Reference Point Dosage Given to Date: 32.04 Gy
Reference Point Session Dosage Given: 2.67 Gy
Session Number: 12

## 2022-02-17 ENCOUNTER — Ambulatory Visit: Payer: 59

## 2022-02-17 ENCOUNTER — Other Ambulatory Visit: Payer: Self-pay

## 2022-02-17 ENCOUNTER — Ambulatory Visit
Admission: RE | Admit: 2022-02-17 | Discharge: 2022-02-17 | Disposition: A | Discharge status: Home / Self Care | Payer: 59 | Source: Ambulatory Visit | Attending: Radiation Oncology | Admitting: Radiation Oncology

## 2022-02-17 ENCOUNTER — Ambulatory Visit: Payer: 59 | Attending: Surgery

## 2022-02-17 VITALS — Wt 188.0 lb

## 2022-02-17 DIAGNOSIS — Z483 Aftercare following surgery for neoplasm: Secondary | ICD-10-CM | POA: Insufficient documentation

## 2022-02-17 DIAGNOSIS — C50212 Malignant neoplasm of upper-inner quadrant of left female breast: Secondary | ICD-10-CM | POA: Diagnosis not present

## 2022-02-17 DIAGNOSIS — Z170 Estrogen receptor positive status [ER+]: Secondary | ICD-10-CM | POA: Diagnosis not present

## 2022-02-17 DIAGNOSIS — Z510 Encounter for antineoplastic radiation therapy: Secondary | ICD-10-CM | POA: Diagnosis not present

## 2022-02-17 DIAGNOSIS — C50919 Malignant neoplasm of unspecified site of unspecified female breast: Secondary | ICD-10-CM | POA: Diagnosis not present

## 2022-02-17 LAB — RAD ONC ARIA SESSION SUMMARY
Course Elapsed Days: 20
Plan Fractions Treated to Date: 13
Plan Prescribed Dose Per Fraction: 2.67 Gy
Plan Total Fractions Prescribed: 15
Plan Total Prescribed Dose: 40.05 Gy
Reference Point Dosage Given to Date: 34.71 Gy
Reference Point Session Dosage Given: 2.67 Gy
Session Number: 13

## 2022-02-17 NOTE — Therapy (Addendum)
  OUTPATIENT PHYSICAL THERAPY SOZO SCREENING NOTE   Patient Name: Laura Patrick MRN: [id number] DOB:1955/09/19, 66 y.o., female Today's Date: 02/17/2022  PCP: Midge Minium, MD REFERRING PROVIDER: Erroll Luna, MD   PT End of Session - 02/17/22 1009     Visit Number 2   # uchanged due to screen only   PT Start Time 1003    PT Stop Time 1011    PT Time Calculation (min) 8 min    Activity Tolerance Patient tolerated treatment well    Behavior During Therapy WFL for tasks assessed/performed             Past Medical History:  Diagnosis Date   Anemia    Cancer ([place]) 10/2021   left breast IDC   Hypertension    Ovarian cyst    Stage I breast cancer in female [facility]) 12/27/2021   Past Surgical History:  Procedure Laterality Date   BREAST LUMPECTOMY WITH RADIOACTIVE SEED AND SENTINEL LYMPH NODE BIOPSY Left 11/27/2021   Procedure: LEFT BREAST LUMPECTOMY WITH RADIOACTIVE SEEDS X3 AND SENTINEL LYMPH NODE BIOPSY;  Surgeon: Erroll Luna, MD;  Location: [place];  Service: General;  Laterality: Left;   COLONOSCOPY     OOPHORECTOMY Right 1988   OVARIAN CYST REMOVAL     TOE SURGERY     Patient Active Problem List   Diagnosis Date Noted   Stage I breast cancer in female ([place]) 12/27/2021   Retinal arteriolar sclerosis 11/13/2020   Vitamin D deficiency 12/28/2019   History of vitamin D deficiency 05/29/2017   Physical exam 08/10/2014   Encounter for Papanicolaou smear of cervix 08/10/2014   Morbid obesity ([place]) 10/14/2013   Pain in limb 05/23/2010   PES PLANUS 05/23/2010   PARESTHESIA 05/23/2010   HYPOPOTASSEMIA 08/30/2008   Hyperlipidemia 04/12/2007   SYNDROME, CARPAL TUNNEL 04/12/2007   Essential hypertension 04/12/2007    REFERRING DIAG: left breast cancer at risk for lymphedema  THERAPY DIAG: Aftercare following surgery for neoplasm  PERTINENT HISTORY: Patient was diagnosed on 10/11/21 with left grade 2. It measures 0.2 cm and is located in  the upper outer quadrant. It is ER/PR+, HER2- with a Ki67 of 5%. Pt underwent a L breast lumpectomy and SLNB (0/2) on 11/27/21  PRECAUTIONS: left UE Lymphedema risk, None  SUBJECTIVE: Pt returns for her 3 month L-Dex screen.   PAIN:  Are you having pain? No  SOZO SCREENING: Patient was assessed today using the SOZO machine to determine the lymphedema index score. This was compared to her baseline score. It was determined that she is within the recommended range when compared to her baseline and no further action is needed at this time. She will continue SOZO screenings. These are done every 3 months for 2 years post operatively followed by every 6 months for 2 years, and then annually.   L-DEX FLOWSHEETS - 02/17/22 1000       L-DEX LYMPHEDEMA SCREENING   Measurement Type Unilateral    L-DEX MEASUREMENT EXTREMITY Upper Extremity    POSITION  Standing    DOMINANT SIDE Right    At Risk Side Left    BASELINE SCORE (UNILATERAL) 0.5    L-DEX SCORE (UNILATERAL) 4.7    VALUE CHANGE (UNILAT) 4.2              Otelia Limes, PTA 02/17/2022, 10:11 AM

## 2022-02-18 ENCOUNTER — Ambulatory Visit
Admission: RE | Admit: 2022-02-18 | Discharge: 2022-02-18 | Disposition: A | Discharge status: Home / Self Care | Payer: 59 | Source: Ambulatory Visit | Attending: Radiation Oncology | Admitting: Radiation Oncology

## 2022-02-18 ENCOUNTER — Ambulatory Visit: Payer: 59

## 2022-02-18 ENCOUNTER — Other Ambulatory Visit: Payer: Self-pay

## 2022-02-18 DIAGNOSIS — C50919 Malignant neoplasm of unspecified site of unspecified female breast: Secondary | ICD-10-CM | POA: Diagnosis not present

## 2022-02-18 DIAGNOSIS — Z510 Encounter for antineoplastic radiation therapy: Secondary | ICD-10-CM | POA: Diagnosis not present

## 2022-02-18 DIAGNOSIS — Z170 Estrogen receptor positive status [ER+]: Secondary | ICD-10-CM | POA: Diagnosis not present

## 2022-02-18 DIAGNOSIS — C50212 Malignant neoplasm of upper-inner quadrant of left female breast: Secondary | ICD-10-CM | POA: Diagnosis not present

## 2022-02-18 LAB — RAD ONC ARIA SESSION SUMMARY
Course Elapsed Days: 21
Plan Fractions Treated to Date: 14
Plan Prescribed Dose Per Fraction: 2.67 Gy
Plan Total Fractions Prescribed: 15
Plan Total Prescribed Dose: 40.05 Gy
Reference Point Dosage Given to Date: 37.38 Gy
Reference Point Session Dosage Given: 2.67 Gy
Session Number: 14

## 2022-02-18 MED ORDER — RADIAPLEXRX EX GEL: Freq: Once | CUTANEOUS | Status: AC

## 2022-02-19 ENCOUNTER — Ambulatory Visit: Payer: 59

## 2022-02-19 ENCOUNTER — Other Ambulatory Visit: Payer: Self-pay

## 2022-02-19 ENCOUNTER — Ambulatory Visit
Admission: RE | Admit: 2022-02-19 | Discharge: 2022-02-19 | Disposition: A | Discharge status: Home / Self Care | Payer: 59 | Source: Ambulatory Visit | Attending: Radiation Oncology | Admitting: Radiation Oncology

## 2022-02-19 DIAGNOSIS — Z510 Encounter for antineoplastic radiation therapy: Secondary | ICD-10-CM | POA: Diagnosis not present

## 2022-02-19 DIAGNOSIS — C50919 Malignant neoplasm of unspecified site of unspecified female breast: Secondary | ICD-10-CM | POA: Diagnosis not present

## 2022-02-19 DIAGNOSIS — C50212 Malignant neoplasm of upper-inner quadrant of left female breast: Secondary | ICD-10-CM | POA: Diagnosis not present

## 2022-02-19 DIAGNOSIS — Z170 Estrogen receptor positive status [ER+]: Secondary | ICD-10-CM | POA: Diagnosis not present

## 2022-02-19 LAB — RAD ONC ARIA SESSION SUMMARY
Course Elapsed Days: 22
Plan Fractions Treated to Date: 15
Plan Prescribed Dose Per Fraction: 2.67 Gy
Plan Total Fractions Prescribed: 15
Plan Total Prescribed Dose: 40.05 Gy
Reference Point Dosage Given to Date: 40.05 Gy
Reference Point Session Dosage Given: 2.67 Gy
Session Number: 15

## 2022-02-20 ENCOUNTER — Ambulatory Visit: Payer: 59

## 2022-02-20 ENCOUNTER — Other Ambulatory Visit: Payer: Self-pay

## 2022-02-20 ENCOUNTER — Ambulatory Visit
Admission: RE | Admit: 2022-02-20 | Discharge: 2022-02-20 | Disposition: A | Discharge status: Home / Self Care | Payer: 59 | Source: Ambulatory Visit | Attending: Radiation Oncology | Admitting: Radiation Oncology

## 2022-02-20 DIAGNOSIS — Z170 Estrogen receptor positive status [ER+]: Secondary | ICD-10-CM | POA: Diagnosis not present

## 2022-02-20 DIAGNOSIS — Z510 Encounter for antineoplastic radiation therapy: Secondary | ICD-10-CM | POA: Diagnosis not present

## 2022-02-20 DIAGNOSIS — C50212 Malignant neoplasm of upper-inner quadrant of left female breast: Secondary | ICD-10-CM | POA: Diagnosis not present

## 2022-02-20 DIAGNOSIS — C50919 Malignant neoplasm of unspecified site of unspecified female breast: Secondary | ICD-10-CM | POA: Diagnosis not present

## 2022-02-20 LAB — RAD ONC ARIA SESSION SUMMARY
Course Elapsed Days: 23
Plan Fractions Treated to Date: 1
Plan Prescribed Dose Per Fraction: 2 Gy
Plan Total Fractions Prescribed: 6
Plan Total Prescribed Dose: 12 Gy
Reference Point Dosage Given to Date: 42.05 Gy
Reference Point Session Dosage Given: 2 Gy
Session Number: 16

## 2022-02-21 ENCOUNTER — Ambulatory Visit: Payer: 59

## 2022-02-21 ENCOUNTER — Ambulatory Visit
Admission: RE | Admit: 2022-02-21 | Discharge: 2022-02-21 | Disposition: A | Discharge status: Home / Self Care | Payer: 59 | Source: Ambulatory Visit | Attending: Radiation Oncology | Admitting: Radiation Oncology

## 2022-02-21 ENCOUNTER — Other Ambulatory Visit: Payer: Self-pay

## 2022-02-21 DIAGNOSIS — C50919 Malignant neoplasm of unspecified site of unspecified female breast: Secondary | ICD-10-CM | POA: Diagnosis not present

## 2022-02-21 LAB — RAD ONC ARIA SESSION SUMMARY
Course Elapsed Days: 24
Plan Fractions Treated to Date: 2
Plan Prescribed Dose Per Fraction: 2 Gy
Plan Total Fractions Prescribed: 6
Plan Total Prescribed Dose: 12 Gy
Reference Point Dosage Given to Date: 44.05 Gy
Reference Point Session Dosage Given: 2 Gy
Session Number: 17

## 2022-02-24 ENCOUNTER — Ambulatory Visit: Payer: 59

## 2022-02-24 ENCOUNTER — Other Ambulatory Visit: Payer: Self-pay

## 2022-02-24 ENCOUNTER — Ambulatory Visit
Admission: RE | Admit: 2022-02-24 | Discharge: 2022-02-24 | Disposition: A | Discharge status: Home / Self Care | Payer: 59 | Source: Ambulatory Visit | Attending: Radiation Oncology | Admitting: Radiation Oncology

## 2022-02-24 DIAGNOSIS — C50919 Malignant neoplasm of unspecified site of unspecified female breast: Secondary | ICD-10-CM | POA: Diagnosis not present

## 2022-02-24 LAB — RAD ONC ARIA SESSION SUMMARY
Course Elapsed Days: 27
Plan Fractions Treated to Date: 3
Plan Prescribed Dose Per Fraction: 2 Gy
Plan Total Fractions Prescribed: 6
Plan Total Prescribed Dose: 12 Gy
Reference Point Dosage Given to Date: 46.05 Gy
Reference Point Session Dosage Given: 2 Gy
Session Number: 18

## 2022-02-25 ENCOUNTER — Ambulatory Visit: Payer: 59

## 2022-02-25 ENCOUNTER — Other Ambulatory Visit: Payer: Self-pay

## 2022-02-25 ENCOUNTER — Ambulatory Visit
Admission: RE | Admit: 2022-02-25 | Discharge: 2022-02-25 | Disposition: A | Discharge status: Home / Self Care | Payer: 59 | Source: Ambulatory Visit | Attending: Radiation Oncology | Admitting: Radiation Oncology

## 2022-02-25 DIAGNOSIS — C50919 Malignant neoplasm of unspecified site of unspecified female breast: Secondary | ICD-10-CM | POA: Diagnosis not present

## 2022-02-25 LAB — RAD ONC ARIA SESSION SUMMARY
Course Elapsed Days: 28
Plan Fractions Treated to Date: 4
Plan Prescribed Dose Per Fraction: 2 Gy
Plan Total Fractions Prescribed: 6
Plan Total Prescribed Dose: 12 Gy
Reference Point Dosage Given to Date: 48.05 Gy
Reference Point Session Dosage Given: 2 Gy
Session Number: 19

## 2022-02-26 ENCOUNTER — Ambulatory Visit
Admission: RE | Admit: 2022-02-26 | Discharge: 2022-02-26 | Disposition: A | Discharge status: Home / Self Care | Payer: 59 | Source: Ambulatory Visit | Attending: Radiation Oncology | Admitting: Radiation Oncology

## 2022-02-26 ENCOUNTER — Ambulatory Visit: Payer: 59

## 2022-02-26 ENCOUNTER — Other Ambulatory Visit: Payer: Self-pay

## 2022-02-26 DIAGNOSIS — C50212 Malignant neoplasm of upper-inner quadrant of left female breast: Secondary | ICD-10-CM | POA: Diagnosis not present

## 2022-02-26 DIAGNOSIS — C50919 Malignant neoplasm of unspecified site of unspecified female breast: Secondary | ICD-10-CM | POA: Diagnosis not present

## 2022-02-26 DIAGNOSIS — Z170 Estrogen receptor positive status [ER+]: Secondary | ICD-10-CM | POA: Diagnosis not present

## 2022-02-26 DIAGNOSIS — Z510 Encounter for antineoplastic radiation therapy: Secondary | ICD-10-CM | POA: Diagnosis not present

## 2022-02-26 LAB — RAD ONC ARIA SESSION SUMMARY
Course Elapsed Days: 29
Plan Fractions Treated to Date: 5
Plan Prescribed Dose Per Fraction: 2 Gy
Plan Total Fractions Prescribed: 6
Plan Total Prescribed Dose: 12 Gy
Reference Point Dosage Given to Date: 50.05 Gy
Reference Point Session Dosage Given: 2 Gy
Session Number: 20

## 2022-02-27 ENCOUNTER — Ambulatory Visit: Payer: 59

## 2022-02-27 ENCOUNTER — Encounter: Payer: Self-pay | Admitting: Radiation Oncology

## 2022-02-27 ENCOUNTER — Other Ambulatory Visit: Payer: Self-pay

## 2022-02-27 ENCOUNTER — Ambulatory Visit
Admission: RE | Admit: 2022-02-27 | Discharge: 2022-02-27 | Disposition: A | Discharge status: Home / Self Care | Payer: 59 | Source: Ambulatory Visit | Attending: Radiation Oncology | Admitting: Radiation Oncology

## 2022-02-27 DIAGNOSIS — C50919 Malignant neoplasm of unspecified site of unspecified female breast: Secondary | ICD-10-CM | POA: Diagnosis not present

## 2022-02-27 LAB — RAD ONC ARIA SESSION SUMMARY
Course Elapsed Days: 30
Plan Fractions Treated to Date: 6
Plan Prescribed Dose Per Fraction: 2 Gy
Plan Total Fractions Prescribed: 6
Plan Total Prescribed Dose: 12 Gy
Reference Point Dosage Given to Date: 52.05 Gy
Reference Point Session Dosage Given: 2 Gy
Session Number: 21

## 2022-02-28 ENCOUNTER — Ambulatory Visit: Payer: 59

## 2022-03-07 ENCOUNTER — Inpatient Hospital Stay: Payer: 59 | Admitting: Hematology & Oncology

## 2022-03-07 ENCOUNTER — Other Ambulatory Visit: Payer: Self-pay

## 2022-03-07 ENCOUNTER — Other Ambulatory Visit (HOSPITAL_BASED_OUTPATIENT_CLINIC_OR_DEPARTMENT_OTHER): Payer: Self-pay

## 2022-03-07 ENCOUNTER — Encounter: Payer: Self-pay | Admitting: Hematology & Oncology

## 2022-03-07 ENCOUNTER — Inpatient Hospital Stay: Payer: 59 | Attending: Hematology & Oncology

## 2022-03-07 VITALS — BP 124/69 | HR 64 | Resp 18 | Wt 189.0 lb

## 2022-03-07 DIAGNOSIS — Z91030 Bee allergy status: Secondary | ICD-10-CM | POA: Diagnosis not present

## 2022-03-07 DIAGNOSIS — Z170 Estrogen receptor positive status [ER+]: Secondary | ICD-10-CM | POA: Insufficient documentation

## 2022-03-07 DIAGNOSIS — Z79811 Long term (current) use of aromatase inhibitors: Secondary | ICD-10-CM | POA: Insufficient documentation

## 2022-03-07 DIAGNOSIS — Z79899 Other long term (current) drug therapy: Secondary | ICD-10-CM | POA: Diagnosis not present

## 2022-03-07 DIAGNOSIS — C50212 Malignant neoplasm of upper-inner quadrant of left female breast: Secondary | ICD-10-CM | POA: Insufficient documentation

## 2022-03-07 DIAGNOSIS — C50919 Malignant neoplasm of unspecified site of unspecified female breast: Secondary | ICD-10-CM | POA: Diagnosis not present

## 2022-03-07 DIAGNOSIS — Z923 Personal history of irradiation: Secondary | ICD-10-CM | POA: Diagnosis not present

## 2022-03-07 DIAGNOSIS — Z880 Allergy status to penicillin: Secondary | ICD-10-CM | POA: Diagnosis not present

## 2022-03-07 LAB — CBC WITH DIFFERENTIAL (CANCER CENTER ONLY)
Abs Immature Granulocytes: 0.01 10*3/uL (ref 0.00–0.07)
Basophils Absolute: 0.1 10*3/uL (ref 0.0–0.1)
Basophils Relative: 1 %
Eosinophils Absolute: 0.2 10*3/uL (ref 0.0–0.5)
Eosinophils Relative: 5 %
HCT: 42.6 % (ref 36.0–46.0)
Hemoglobin: 13.9 g/dL (ref 12.0–15.0)
Immature Granulocytes: 0 %
Lymphocytes Relative: 31 %
Lymphs Abs: 1.2 10*3/uL (ref 0.7–4.0)
MCH: 28.5 pg (ref 26.0–34.0)
MCHC: 32.6 g/dL (ref 30.0–36.0)
MCV: 87.5 fL (ref 80.0–100.0)
Monocytes Absolute: 0.4 10*3/uL (ref 0.1–1.0)
Monocytes Relative: 12 %
Neutro Abs: 1.9 10*3/uL (ref 1.7–7.7)
Neutrophils Relative %: 51 %
Platelet Count: 249 10*3/uL (ref 150–400)
RBC: 4.87 MIL/uL (ref 3.87–5.11)
RDW: 14 % (ref 11.5–15.5)
WBC Count: 3.8 10*3/uL — ABNORMAL LOW (ref 4.0–10.5)
nRBC: 0 % (ref 0.0–0.2)

## 2022-03-07 LAB — CMP (CANCER CENTER ONLY)
ALT: 13 U/L (ref 0–44)
AST: 11 U/L — ABNORMAL LOW (ref 15–41)
Albumin: 4.9 g/dL (ref 3.5–5.0)
Alkaline Phosphatase: 73 U/L (ref 38–126)
Anion gap: 8 (ref 5–15)
BUN: 13 mg/dL (ref 8–23)
CO2: 29 mmol/L (ref 22–32)
Calcium: 10.8 mg/dL — ABNORMAL HIGH (ref 8.9–10.3)
Chloride: 106 mmol/L (ref 98–111)
Creatinine: 0.92 mg/dL (ref 0.44–1.00)
GFR, Estimated: >60 mL/min (ref >60)
Glucose, Bld: 103 mg/dL — ABNORMAL HIGH (ref 70–99)
Potassium: 4.6 mmol/L (ref 3.5–5.1)
Sodium: 143 mmol/L (ref 135–145)
Total Bilirubin: 0.5 mg/dL (ref 0.3–1.2)
Total Protein: 8.5 g/dL — ABNORMAL HIGH (ref 6.5–8.1)

## 2022-03-07 LAB — LACTATE DEHYDROGENASE: LDH: 168 U/L (ref 98–192)

## 2022-03-07 LAB — VITAMIN D 25 HYDROXY (VIT D DEFICIENCY, FRACTURES): Vit D, 25-Hydroxy: 26.51 ng/mL — ABNORMAL LOW (ref 30–100)

## 2022-03-07 MED ORDER — LETROZOLE 2.5 MG PO TABS
2.5000 mg | ORAL_TABLET | Freq: Every day | ORAL | 12 refills | Status: DC
  Filled 2022-03-07: qty 30, 30d supply, fill #0
  Filled 2022-04-04: qty 30, 30d supply, fill #1
  Filled 2022-05-05: qty 30, 30d supply, fill #2
  Filled 2022-06-04: qty 30, 30d supply, fill #3
  Filled 2022-07-01: qty 30, 30d supply, fill #4
  Filled 2022-07-29: qty 30, 30d supply, fill #5
  Filled 2022-09-02: qty 30, 30d supply, fill #6
  Filled 2022-09-29: qty 30, 30d supply, fill #7
  Filled 2022-10-28 – 2022-11-04 (×2): qty 30, 30d supply, fill #8
  Filled 2022-12-05: qty 30, 30d supply, fill #9
  Filled 2023-01-08: qty 30, 30d supply, fill #10
  Filled 2023-02-03: qty 30, 30d supply, fill #11

## 2022-03-07 NOTE — Progress Notes (Signed)
Hematology and Oncology Follow Up Visit  Laura Patrick [id number] September 03, 1955 66 y.o. 03/07/2022   Principle Diagnosis:  Stage IA (T1aN0M0) infiltrating ductal carcinoma of the LEFT breast   --ER+/PR+/HER2- -- Oncotype =15  Current Therapy:   Status post left lumpectomy on 11/27/2021 Radiation therapy --start in 01/21/2022 -- completed on 02/26/2022 Femara 2.5 mg p.o. daily -start after XRT -- start on 03/10/2022     Interim History:  Laura Patrick is back for her follow-up.  She finished her radiation therapy last week.  She is that she finished it.  She has little bit of hyperpigmentation on the left breast.  She is little bit tired.  She is taking off work a little bit.  She will get back to work probably a week or so.  She has had no issues with cough.  She had no shortness of breath.  There has been no nausea or vomiting.  There is been no change in bowel or bladder habits.  She has had no leg swelling.  We will going get her on Femara now.  We will start her on 2.5 mg p.o. daily.  She is taking vitamin D weekly.  Overall, I would say her performance status is probably ECOG 1.      Medications:  Current Outpatient Medications:    amLODipine (NORVASC) 10 MG tablet, Take 1 tablet (10 mg total) by mouth daily., Disp: 90 tablet, Rfl: 0   potassium chloride SA (KLOR-CON M) 20 MEQ tablet, Take 1 tablet (20 mEq total) by mouth daily., Disp: 90 tablet, Rfl: 0   valsartan-hydrochlorothiazide (DIOVAN-HCT) 320-12.5 MG tablet, Take 1 tablet by mouth daily., Disp: 90 tablet, Rfl: 0  Allergies:  Allergies  Allergen Reactions   Bee Venom Swelling and Other (See Comments)    Both elbows to the hands, swells, and red water like blisters appear.    Amoxicillin Rash    Past Medical History, Surgical history, Social history, and Family History were reviewed and updated.  Review of Systems: Review of Systems  Constitutional: Negative.   HENT:  Negative.    Eyes: Negative.    Respiratory: Negative.    Cardiovascular: Negative.   Gastrointestinal: Negative.   Endocrine: Negative.   Genitourinary: Negative.    Musculoskeletal: Negative.   Skin: Negative.   Neurological: Negative.   Hematological: Negative.   Psychiatric/Behavioral: Negative.      Physical Exam:  weight is 189 lb (85.7 kg). Her blood pressure is 124/69 and her pulse is 64. Her respiration is 18 and oxygen saturation is 98%.   Wt Readings from Last 3 Encounters:  03/07/22 189 lb (85.7 kg)  02/17/22 188 lb (85.3 kg)  01/30/22 188 lb (85.3 kg)    Physical Exam Vitals reviewed.  Constitutional:      Comments: Breast exam shows right breast with no masses, edema or erythema.  There is no right axillary adenopathy.  Left breast shows a well-healed lumpectomy.  This is at the 12 o'clock position.  She has some hyperpigmentation of the left breast from radiation therapy.  There is no skin breakdown.  There is no palpable left axillary lymph nodes.    She has a healing left lymphadenectomy scar.  HENT:     Head: Normocephalic and atraumatic.  Eyes:     Pupils: Pupils are equal, round, and reactive to light.  Cardiovascular:     Rate and Rhythm: Normal rate and regular rhythm.     Heart sounds: Normal heart sounds.  Pulmonary:  Effort: Pulmonary effort is normal.     Breath sounds: Normal breath sounds.  Abdominal:     General: Bowel sounds are normal.     Palpations: Abdomen is soft.  Musculoskeletal:        General: No tenderness or deformity. Normal range of motion.     Cervical back: Normal range of motion.  Lymphadenopathy:     Cervical: No cervical adenopathy.  Skin:    General: Skin is warm and dry.     Findings: No erythema or rash.  Neurological:     Mental Status: She is alert and oriented to person, place, and time.  Psychiatric:        Behavior: Behavior normal.        Thought Content: Thought content normal.        Judgment: Judgment normal.      Lab Results   Component Value Date   WBC 3.8 (L) 03/07/2022   HGB 13.9 03/07/2022   HCT 42.6 03/07/2022   MCV 87.5 03/07/2022   PLT 249 03/07/2022     Chemistry      Component Value Date/Time   NA 138 01/30/2022 1143   NA 141 01/03/2019 0836   K 4.1 01/30/2022 1143   CL 105 01/30/2022 1143   CO2 27 01/30/2022 1143   BUN 10 01/30/2022 1143   BUN 15 01/03/2019 0836   CREATININE 0.87 01/30/2022 1143   CREATININE 0.91 10/06/2016 1529      Component Value Date/Time   CALCIUM 10.2 01/30/2022 1143   ALKPHOS 68 01/30/2022 1143   AST 11 (L) 01/30/2022 1143   ALT 11 01/30/2022 1143   BILITOT 0.4 01/30/2022 1143      Impression and Plan: Laura Patrick is a very nice 66 year old postmenopausal African-American female.  She has a stage Ia infiltrating ductal carcinoma of the LEFT breast.  This has very good prognostic markers.  The Oncotype score is only 15.  Now that she completed her radiation treatments, we will now move ahead with the Femara.  I do think that her risk of recurrence should be less than 10%.  I would like to get her back to see [place] in 6 weeks.  If all looks good in 6 weeks, then I will try to get her back after the Holiday season.    Volanda Napoleon, MD 8/4/202312:00 PM

## 2022-03-10 ENCOUNTER — Telehealth: Payer: Self-pay | Admitting: *Deleted

## 2022-03-10 ENCOUNTER — Other Ambulatory Visit (HOSPITAL_BASED_OUTPATIENT_CLINIC_OR_DEPARTMENT_OTHER): Payer: Self-pay

## 2022-03-10 ENCOUNTER — Other Ambulatory Visit: Payer: Self-pay | Admitting: *Deleted

## 2022-03-10 DIAGNOSIS — Z8639 Personal history of other endocrine, nutritional and metabolic disease: Secondary | ICD-10-CM

## 2022-03-10 MED ORDER — VITAMIN D (ERGOCALCIFEROL) 1.25 MG (50000 UNIT) PO CAPS
50000.0000 [IU] | ORAL_CAPSULE | ORAL | 1 refills | Status: DC
  Filled 2022-03-10: qty 12, 84d supply, fill #0
  Filled 2022-05-28: qty 12, 84d supply, fill #1

## 2022-03-10 NOTE — Telephone Encounter (Signed)
-----   Message from Laura Napoleon, MD sent at 03/07/2022  5:26 PM EDT ----- Please call and let her know that the vitamin D level is low.  She must be taking 50,000 units a week.  If not, let me know and I will call this in.  Thanks.  Laurey Arrow

## 2022-03-11 ENCOUNTER — Encounter: Payer: Self-pay | Admitting: *Deleted

## 2022-03-11 NOTE — Progress Notes (Signed)
Patient has completed radiation and will now proceed to AI. Prescription sent.   Oncology Nurse Navigator Documentation     03/11/2022   10:30 AM  Oncology Nurse Navigator Flowsheets  Phase of Treatment AI  Aromatase Inhibitor Actual Start Date: 03/07/2022  Radiation Actual End Date: 02/28/2022  Navigator Follow Up Date: 04/18/2022  Navigator Follow Up Reason: Follow-up Appointment  Navigator Location [facility]  Navigator Encounter Type Appt/Treatment Plan Review  Patient Visit Type MedOnc  Treatment Phase Active Tx  Barriers/Navigation Needs Coordination of Care;Education  Interventions None Required  Acuity Level 2-Minimal Needs (1-2 Barriers Identified)  Support Groups/Services Friends and Family  Time Spent with Patient 15

## 2022-03-12 DIAGNOSIS — C50912 Malignant neoplasm of unspecified site of left female breast: Secondary | ICD-10-CM | POA: Diagnosis not present

## 2022-03-14 ENCOUNTER — Encounter: Payer: 59 | Admitting: Family Medicine

## 2022-03-19 ENCOUNTER — Encounter: Payer: 59 | Admitting: Family Medicine

## 2022-03-26 ENCOUNTER — Encounter: Payer: Self-pay | Admitting: Radiation Oncology

## 2022-03-30 NOTE — Progress Notes (Signed)
Radiation Oncology         (336) [phone] ________________________________  Name: Laura Patrick MRN: [id number]  Date: 03/31/2022  DOB: 08/17/1955  Follow-Up Visit Note  CC: Midge Minium, MD  Volanda Napoleon, MD  No diagnosis found.  Diagnosis:   Stage Ia (pT1a, pN0i+) Left Breast UIQ, Invasive and in-situ ductal carcinoma, ER+ / PR+ / Her2-, Grade 1   Cancer Staging  Stage I breast cancer in female [facility]) Staging form: Breast, AJCC 8th Edition - Clinical stage from 12/27/2021: Stage IA (cT1a, cN0, cM0, G2, ER+, PR+, HER2-) - Signed by Volanda Napoleon, MD on 12/27/2021 - Pathologic stage from 01/13/2022: Stage IA (pT1a, pN0(i+), cM0, G1, ER+, PR+, HER2-) - Signed by Gery Pray, MD on 01/13/2022  Interval Since Last Radiation: 1 month and 1 day   Intent: Curative  Radiation Treatment Dates: 01/28/2022 through 02/27/2022 Site Technique Total Dose (Gy) Dose per Fx (Gy) Completed Fx Beam Energies  Breast, Left: Breast_L 3D 40.05/40.05 2.67 15/15 10X  Breast, Left: Breast_L_Bst 3D 12/12 2 6/6 6X, 10X    Narrative:  The patient returns today for routine follow-up. The patient tolerated radiation therapy relatively well. During her final weekly treatment check on 02/26/22, the patient reported fatigue, hyperpigmentation changes, and slight redness (given radiaplex). Physical exam performed on that same date showed, some hyperpigmentation changes and slight erythema to the left breast area.  No skin breakdown was appreciated.   Since her initial consultation date, the patient followed up with Dr. Marin Olp on 01/30/22. Physical exam performed during that visit revealed normal post-op findings. Further treatment options were also reviewed during this visit, and the patient agreed to initiating femara following XRT. During her most recent follow up visit with Dr. Marin Olp on 03/07/22, the patient reported feeling well other than feelings of general tiredness. Physical exam performed  during this visit noted persistent hyperpigmentation to the left breast area from radiation therapy. No skin breakdown was appreciated.   (The patient began femara on 03/07/22)  ***                               Allergies:  is allergic to bee venom and amoxicillin.  Meds: Current Outpatient Medications  Medication Sig Dispense Refill   amLODipine (NORVASC) 10 MG tablet Take 1 tablet (10 mg total) by mouth daily. 90 tablet 0   letrozole (FEMARA) 2.5 MG tablet Take 1 tablet (2.5 mg total) by mouth daily. 30 tablet 12   potassium chloride SA (KLOR-CON M) 20 MEQ tablet Take 1 tablet (20 mEq total) by mouth daily. 90 tablet 0   valsartan-hydrochlorothiazide (DIOVAN-HCT) 320-12.5 MG tablet Take 1 tablet by mouth daily. 90 tablet 0   Vitamin D, Ergocalciferol, (DRISDOL) 1.25 MG (50000 UNIT) CAPS capsule Take 1 capsule (50,000 Units total) by mouth every 7 (seven) days. 12 capsule 1   No current facility-administered medications for this encounter.    Physical Findings: The patient is in no acute distress. Patient is alert and oriented.  vitals were not taken for this visit. .  No significant changes. Lungs are clear to auscultation bilaterally. Heart has regular rate and rhythm. No palpable cervical, supraclavicular, or axillary adenopathy. Abdomen soft, non-tender, normal bowel sounds.  Right Breast: no palpable mass, nipple discharge or bleeding. Left Breast: ***   Lab Findings: Lab Results  Component Value Date   WBC 3.8 (L) 03/07/2022   HGB 13.9 03/07/2022   HCT  42.6 03/07/2022   MCV 87.5 03/07/2022   PLT 249 03/07/2022    Radiographic Findings: No results found.  Impression:  Stage Ia (pT1a, pN0i+) Left Breast UIQ, Invasive and in-situ ductal carcinoma, ER+ / PR+ / Her2-, Grade 1  The patient is recovering from the effects of radiation.  ***  Plan:  ***   *** minutes of total time was spent for this patient encounter, including preparation, face-to-face counseling with  the patient and coordination of care, physical exam, and documentation of the encounter. ____________________________________  Blair Promise, PhD, MD  This document serves as a record of services personally performed by Gery Pray, MD. It was created on his behalf by Roney Mans, a trained medical scribe. The creation of this record is based on the scribe's personal observations and the provider's statements to them. This document has been checked and approved by the attending provider.

## 2022-03-30 NOTE — Progress Notes (Incomplete)
  Radiation Oncology         (336) [phone] ________________________________  Patient Name: Laura Patrick MRN: [id number] DOB: Jan 22, 1956 Referring Physician: Burney Gauze (Profile Not Attached) Date of Service: 02/27/2022 [facility] [place]                                                        End Of Treatment Note  Diagnoses: C50.212-Malignant neoplasm of upper-inner quadrant of left female breast  Cancer Staging: Stage Ia (pT1a, pN0i+) Left Breast UIQ, Invasive and in-situ ductal carcinoma, ER+ / PR+ / Her2-, Grade 1   Cancer Staging  Stage I breast cancer in female [facility]) Staging form: Breast, AJCC 8th Edition - Clinical stage from 12/27/2021: Stage IA (cT1a, cN0, cM0, G2, ER+, PR+, HER2-) - Signed by Volanda Napoleon, MD on 12/27/2021 - Pathologic stage from 01/13/2022: Stage IA (pT1a, pN0(i+), cM0, G1, ER+, PR+, HER2-) - Signed by Gery Pray, MD on 01/13/2022  Intent: Curative  Radiation Treatment Dates: 01/28/2022 through 02/27/2022 Site Technique Total Dose (Gy) Dose per Fx (Gy) Completed Fx Beam Energies  Breast, Left: Breast_L 3D 40.05/40.05 2.67 15/15 10X  Breast, Left: Breast_L_Bst 3D 12/12 2 6/6 6X, 10X   Narrative: The patient tolerated radiation therapy relatively well. During her final weekly treatment check on 02/26/22, the patient reported fatigue, hyperpigmentation changes, and slight redness (given radiaplex). Physical exam performed on that same date showed, some hyperpigmentation changes and slight erythema to the left breast area.  No skin breakdown was appreciated.    Plan: The patient will follow-up with radiation oncology in one month.  ________________________________________________ -----------------------------------  Blair Promise, PhD, MD  This document serves as a record of services personally performed by Gery Pray, MD. It was created on his behalf by Roney Mans, a trained medical scribe. The creation of this record is  based on the scribe's personal observations and the provider's statements to them. This document has been checked and approved by the attending provider.

## 2022-03-31 ENCOUNTER — Other Ambulatory Visit: Payer: Self-pay

## 2022-03-31 ENCOUNTER — Encounter: Payer: Self-pay | Admitting: Radiation Oncology

## 2022-03-31 ENCOUNTER — Ambulatory Visit
Admission: RE | Admit: 2022-03-31 | Discharge: 2022-03-31 | Disposition: A | Discharge status: Home / Self Care | Payer: 59 | Source: Ambulatory Visit | Attending: Radiation Oncology | Admitting: Radiation Oncology

## 2022-03-31 DIAGNOSIS — Z79811 Long term (current) use of aromatase inhibitors: Secondary | ICD-10-CM | POA: Diagnosis not present

## 2022-03-31 DIAGNOSIS — C50212 Malignant neoplasm of upper-inner quadrant of left female breast: Secondary | ICD-10-CM | POA: Insufficient documentation

## 2022-03-31 DIAGNOSIS — Z79899 Other long term (current) drug therapy: Secondary | ICD-10-CM | POA: Diagnosis not present

## 2022-03-31 DIAGNOSIS — R5383 Other fatigue: Secondary | ICD-10-CM | POA: Insufficient documentation

## 2022-03-31 DIAGNOSIS — L819 Disorder of pigmentation, unspecified: Secondary | ICD-10-CM | POA: Diagnosis not present

## 2022-03-31 DIAGNOSIS — Z923 Personal history of irradiation: Secondary | ICD-10-CM | POA: Insufficient documentation

## 2022-03-31 DIAGNOSIS — Z170 Estrogen receptor positive status [ER+]: Secondary | ICD-10-CM | POA: Diagnosis not present

## 2022-03-31 DIAGNOSIS — R232 Flushing: Secondary | ICD-10-CM | POA: Insufficient documentation

## 2022-03-31 DIAGNOSIS — C50919 Malignant neoplasm of unspecified site of unspecified female breast: Secondary | ICD-10-CM

## 2022-03-31 HISTORY — DX: Personal history of irradiation: Z92.3

## 2022-03-31 NOTE — Progress Notes (Signed)
Laura Patrick is here today for follow up post radiation to the breast.   Breast Side:Left.   They completed their radiation on: 02/27/22   Does the patient complain of any of the following: Post radiation skin issues: Patient states skin has improved.  Breast Tenderness: No Breast Swelling: No Lymphadema: No Range of Motion limitations: No Fatigue post radiation: No Appetite good/fair/poor: Good, eating smaller portions.   Additional comments if applicable:   BP 539/76 (BP Location: Right Arm, Patient Position: Sitting, Cuff Size: Large)   Pulse 67   Temp 97.9 F (36.6 C)   Resp 20   Ht 5' (1.524 m)   Wt 188 lb 6.4 oz (85.5 kg)   SpO2 97%   BMI 36.79 kg/m

## 2022-04-01 ENCOUNTER — Ambulatory Visit (INDEPENDENT_AMBULATORY_CARE_PROVIDER_SITE_OTHER): Payer: 59 | Admitting: Family Medicine

## 2022-04-01 ENCOUNTER — Encounter: Payer: Self-pay | Admitting: Family Medicine

## 2022-04-01 VITALS — BP 130/84 | HR 86 | Temp 97.8°F | Resp 18 | Ht 60.0 in | Wt 190.0 lb

## 2022-04-01 DIAGNOSIS — E559 Vitamin D deficiency, unspecified: Secondary | ICD-10-CM | POA: Diagnosis not present

## 2022-04-01 DIAGNOSIS — Z0000 Encounter for general adult medical examination without abnormal findings: Secondary | ICD-10-CM | POA: Diagnosis not present

## 2022-04-01 DIAGNOSIS — Z1159 Encounter for screening for other viral diseases: Secondary | ICD-10-CM

## 2022-04-01 LAB — CBC WITH DIFFERENTIAL/PLATELET
Basophils Absolute: 0 10*3/uL (ref 0.0–0.1)
Basophils Relative: 1 % (ref 0.0–3.0)
Eosinophils Absolute: 0.2 10*3/uL (ref 0.0–0.7)
Eosinophils Relative: 5.2 % — ABNORMAL HIGH (ref 0.0–5.0)
HCT: 40.6 % (ref 36.0–46.0)
Hemoglobin: 13.6 g/dL (ref 12.0–15.0)
Lymphocytes Relative: 34.6 % (ref 12.0–46.0)
Lymphs Abs: 1.3 10*3/uL (ref 0.7–4.0)
MCHC: 33.4 g/dL (ref 30.0–36.0)
MCV: 86.8 fl (ref 78.0–100.0)
Monocytes Absolute: 0.5 10*3/uL (ref 0.1–1.0)
Monocytes Relative: 12.8 % — ABNORMAL HIGH (ref 3.0–12.0)
Neutro Abs: 1.8 10*3/uL (ref 1.4–7.7)
Neutrophils Relative %: 46.4 % (ref 43.0–77.0)
Platelets: 289 10*3/uL (ref 150.0–400.0)
RBC: 4.68 Mil/uL (ref 3.87–5.11)
RDW: 14.4 % (ref 11.5–15.5)
WBC: 3.8 10*3/uL — ABNORMAL LOW (ref 4.0–10.5)

## 2022-04-01 LAB — LIPID PANEL
Cholesterol: 175 mg/dL (ref 0–200)
HDL: 51.8 mg/dL (ref >39.00)
LDL Cholesterol: 89 mg/dL (ref 0–99)
NonHDL: 123.64
Total CHOL/HDL Ratio: 3
Triglycerides: 171 mg/dL — ABNORMAL HIGH (ref 0.0–149.0)
VLDL: 34.2 mg/dL (ref 0.0–40.0)

## 2022-04-01 LAB — BASIC METABOLIC PANEL
BUN: 12 mg/dL (ref 6–23)
CO2: 26 mEq/L (ref 19–32)
Calcium: 10.3 mg/dL (ref 8.4–10.5)
Chloride: 103 mEq/L (ref 96–112)
Creatinine, Ser: 0.89 mg/dL (ref 0.40–1.20)
GFR: 66.64 mL/min (ref >60.00)
Glucose, Bld: 97 mg/dL (ref 70–99)
Potassium: 3.6 mEq/L (ref 3.5–5.1)
Sodium: 141 mEq/L (ref 135–145)

## 2022-04-01 LAB — HEPATIC FUNCTION PANEL
ALT: 11 U/L (ref 0–35)
AST: 13 U/L (ref 0–37)
Albumin: 4.5 g/dL (ref 3.5–5.2)
Alkaline Phosphatase: 78 U/L (ref 39–117)
Bilirubin, Direct: 0.1 mg/dL (ref 0.0–0.3)
Total Bilirubin: 0.4 mg/dL (ref 0.2–1.2)
Total Protein: 8.5 g/dL — ABNORMAL HIGH (ref 6.0–8.3)

## 2022-04-01 LAB — VITAMIN D 25 HYDROXY (VIT D DEFICIENCY, FRACTURES): VITD: 36.32 ng/mL (ref 30.00–100.00)

## 2022-04-01 LAB — TSH: TSH: 4.13 u[IU]/mL (ref 0.35–5.50)

## 2022-04-01 NOTE — Assessment & Plan Note (Signed)
Check labs and replete prn. 

## 2022-04-01 NOTE — Assessment & Plan Note (Signed)
Ongoing issue for pt.  BMI 37.11  Encouraged healthy diet and regular exercise.  Check labs to risk stratify.  Will follow.

## 2022-04-01 NOTE — Assessment & Plan Note (Signed)
Pt's PE WNL w/ exception of BMI.  UTD on mammo, colonoscopy, Tdap.  Declines PNA at this time.  Check labs.  Anticipatory guidance provided.

## 2022-04-01 NOTE — Patient Instructions (Signed)
Follow up in 6 months to recheck BP We'll notify you of your lab results and make any changes if needed Continue to work on healthy diet and regular exercise- you can do it!! Call with any questions or concerns Stay Safe!  Stay Healthy! Happy Labor Day!!!

## 2022-04-01 NOTE — Progress Notes (Signed)
   Subjective:    Patient ID: Laura Patrick, female    DOB: Apr 11, 1956, 66 y.o.   MRN: [id number]  HPI CPE- UTD on mammo, colonoscopy, Tdap.  Declines flu and PNA  Pt reports feeling good, no concerns  Patient Care Team    Relationship Specialty Notifications Start End  Midge Minium, MD PCP - General Family Medicine  10/14/13   Volanda Napoleon, MD Medical Oncologist Oncology  10/11/21   Cordelia Poche, RN Oncology Nurse Navigator   10/11/21     Health Maintenance  Topic Date Due   Hepatitis C Screening  Never done   COVID-19 Vaccine (4 - Moderna risk series) 04/17/2022 (Originally 06/28/2021)   Zoster Vaccines- Shingrix (1 of 2) 07/02/2022 (Originally 11/13/1972)   INFLUENZA VACCINE  11/02/2022 (Originally 03/04/2022)   Pneumonia Vaccine 19+ Years old (1 - PCV) 04/02/2023 (Originally 11/14/2018)   MAMMOGRAM  09/20/2022   COLONOSCOPY (Pts 45-77yr Insurance coverage will need to be confirmed)  09/04/2027   TETANUS/TDAP  01/25/2031   DEXA SCAN  Completed   HPV VACCINES  Aged Out      Review of Systems Patient reports no vision/ hearing changes, adenopathy,fever, weight change,  persistant/recurrent hoarseness , swallowing issues, chest pain, palpitations, edema, persistant/recurrent cough, hemoptysis, dyspnea (rest/exertional/paroxysmal nocturnal), gastrointestinal bleeding (melena, rectal bleeding), abdominal pain, significant heartburn, bowel changes, GU symptoms (dysuria, hematuria, incontinence), Gyn symptoms (abnormal  bleeding, pain),  syncope, focal weakness, memory loss, numbness & tingling, skin/hair/nail changes, abnormal bruising or bleeding, anxiety, or depression.     Objective:   Physical Exam General Appearance:    Alert, cooperative, no distress, appears stated age, obese  Head:    Normocephalic, without obvious abnormality, atraumatic  Eyes:    PERRL, conjunctiva/corneas clear, EOM's intact both eyes  Ears:    Normal TM's and external ear canals, both ears   Nose:   Nares normal, septum midline, mucosa normal, no drainage    or sinus tenderness  Throat:   Lips, mucosa, and tongue normal; teeth and gums normal  Neck:   Supple, symmetrical, trachea midline, no adenopathy;    Thyroid: no enlargement/tenderness/nodules  Back:     Symmetric, no curvature, ROM normal, no CVA tenderness  Lungs:     Clear to auscultation bilaterally, respirations unlabored  Chest Wall:    No tenderness or deformity   Heart:    Regular rate and rhythm, S1 and S2 normal, no murmur, rub   or gallop  Breast Exam:    Deferred to mammo  Abdomen:     Soft, non-tender, bowel sounds active all four quadrants,    no masses, no organomegaly  Genitalia:    Deferred  Rectal:    Extremities:   Extremities normal, atraumatic, no cyanosis or edema  Pulses:   2+ and symmetric all extremities  Skin:   Skin color, texture, turgor normal, no rashes or lesions  Lymph nodes:   Cervical, supraclavicular, and axillary nodes normal  Neurologic:   CNII-XII intact, normal strength, sensation and reflexes    throughout          Assessment & Plan:

## 2022-04-02 ENCOUNTER — Telehealth: Payer: Self-pay

## 2022-04-02 LAB — HEPATITIS C ANTIBODY: Hepatitis C Ab: NONREACTIVE

## 2022-04-02 NOTE — Telephone Encounter (Signed)
Left pt a vm in regards to lab results  

## 2022-04-02 NOTE — Telephone Encounter (Signed)
-----   Message from Midge Minium, MD sent at 04/02/2022  7:21 AM EDT ----- Labs look great!  No changes at this time

## 2022-04-04 ENCOUNTER — Other Ambulatory Visit: Payer: Self-pay | Admitting: Family Medicine

## 2022-04-04 ENCOUNTER — Other Ambulatory Visit (HOSPITAL_BASED_OUTPATIENT_CLINIC_OR_DEPARTMENT_OTHER): Payer: Self-pay

## 2022-04-04 DIAGNOSIS — I10 Essential (primary) hypertension: Secondary | ICD-10-CM

## 2022-04-08 ENCOUNTER — Other Ambulatory Visit (HOSPITAL_BASED_OUTPATIENT_CLINIC_OR_DEPARTMENT_OTHER): Payer: Self-pay

## 2022-04-08 MED ORDER — AMLODIPINE BESYLATE 10 MG PO TABS
10.0000 mg | ORAL_TABLET | Freq: Every day | ORAL | 0 refills | Status: DC
  Filled 2022-04-08: qty 90, 90d supply, fill #0

## 2022-04-08 MED ORDER — VALSARTAN-HYDROCHLOROTHIAZIDE 320-12.5 MG PO TABS
1.0000 | ORAL_TABLET | Freq: Every day | ORAL | 0 refills | Status: DC
  Filled 2022-04-08: qty 90, 90d supply, fill #0

## 2022-04-08 MED ORDER — POTASSIUM CHLORIDE CRYS ER 20 MEQ PO TBCR
20.0000 meq | EXTENDED_RELEASE_TABLET | Freq: Every day | ORAL | 0 refills | Status: DC
  Filled 2022-04-08: qty 90, 90d supply, fill #0

## 2022-04-18 ENCOUNTER — Other Ambulatory Visit: Payer: Self-pay

## 2022-04-18 ENCOUNTER — Inpatient Hospital Stay: Payer: 59 | Admitting: Hematology & Oncology

## 2022-04-18 ENCOUNTER — Encounter: Payer: Self-pay | Admitting: *Deleted

## 2022-04-18 ENCOUNTER — Encounter: Payer: Self-pay | Admitting: Hematology & Oncology

## 2022-04-18 ENCOUNTER — Inpatient Hospital Stay: Payer: 59 | Attending: Hematology & Oncology

## 2022-04-18 VITALS — BP 125/63 | HR 69 | Temp 98.3°F | Resp 18 | Wt 190.0 lb

## 2022-04-18 DIAGNOSIS — Z923 Personal history of irradiation: Secondary | ICD-10-CM | POA: Insufficient documentation

## 2022-04-18 DIAGNOSIS — L819 Disorder of pigmentation, unspecified: Secondary | ICD-10-CM | POA: Diagnosis not present

## 2022-04-18 DIAGNOSIS — Z79811 Long term (current) use of aromatase inhibitors: Secondary | ICD-10-CM | POA: Diagnosis not present

## 2022-04-18 DIAGNOSIS — Z170 Estrogen receptor positive status [ER+]: Secondary | ICD-10-CM | POA: Insufficient documentation

## 2022-04-18 DIAGNOSIS — Z880 Allergy status to penicillin: Secondary | ICD-10-CM | POA: Insufficient documentation

## 2022-04-18 DIAGNOSIS — C50919 Malignant neoplasm of unspecified site of unspecified female breast: Secondary | ICD-10-CM | POA: Diagnosis not present

## 2022-04-18 DIAGNOSIS — Z91030 Bee allergy status: Secondary | ICD-10-CM | POA: Insufficient documentation

## 2022-04-18 DIAGNOSIS — Z79899 Other long term (current) drug therapy: Secondary | ICD-10-CM | POA: Diagnosis not present

## 2022-04-18 DIAGNOSIS — C50212 Malignant neoplasm of upper-inner quadrant of left female breast: Secondary | ICD-10-CM | POA: Insufficient documentation

## 2022-04-18 LAB — CMP (CANCER CENTER ONLY)
ALT: 13 U/L (ref 0–44)
AST: 12 U/L — ABNORMAL LOW (ref 15–41)
Albumin: 4.7 g/dL (ref 3.5–5.0)
Alkaline Phosphatase: 70 U/L (ref 38–126)
Anion gap: 8 (ref 5–15)
BUN: 11 mg/dL (ref 8–23)
CO2: 28 mmol/L (ref 22–32)
Calcium: 10.4 mg/dL — ABNORMAL HIGH (ref 8.9–10.3)
Chloride: 103 mmol/L (ref 98–111)
Creatinine: 0.92 mg/dL (ref 0.44–1.00)
GFR, Estimated: >60 mL/min (ref >60)
Glucose, Bld: 116 mg/dL — ABNORMAL HIGH (ref 70–99)
Potassium: 3.8 mmol/L (ref 3.5–5.1)
Sodium: 139 mmol/L (ref 135–145)
Total Bilirubin: 0.5 mg/dL (ref 0.3–1.2)
Total Protein: 8.8 g/dL — ABNORMAL HIGH (ref 6.5–8.1)

## 2022-04-18 LAB — CBC WITH DIFFERENTIAL (CANCER CENTER ONLY)
Abs Immature Granulocytes: 0.02 10*3/uL (ref 0.00–0.07)
Basophils Absolute: 0.1 10*3/uL (ref 0.0–0.1)
Basophils Relative: 1 %
Eosinophils Absolute: 0.1 10*3/uL (ref 0.0–0.5)
Eosinophils Relative: 4 %
HCT: 43.2 % (ref 36.0–46.0)
Hemoglobin: 14.2 g/dL (ref 12.0–15.0)
Immature Granulocytes: 1 %
Lymphocytes Relative: 41 %
Lymphs Abs: 1.7 10*3/uL (ref 0.7–4.0)
MCH: 28.2 pg (ref 26.0–34.0)
MCHC: 32.9 g/dL (ref 30.0–36.0)
MCV: 85.9 fL (ref 80.0–100.0)
Monocytes Absolute: 0.4 10*3/uL (ref 0.1–1.0)
Monocytes Relative: 11 %
Neutro Abs: 1.7 10*3/uL (ref 1.7–7.7)
Neutrophils Relative %: 42 %
Platelet Count: 277 10*3/uL (ref 150–400)
RBC: 5.03 MIL/uL (ref 3.87–5.11)
RDW: 13.5 % (ref 11.5–15.5)
WBC Count: 4 10*3/uL (ref 4.0–10.5)
nRBC: 0 % (ref 0.0–0.2)

## 2022-04-18 LAB — VITAMIN D 25 HYDROXY (VIT D DEFICIENCY, FRACTURES): Vit D, 25-Hydroxy: 47.42 ng/mL (ref 30–100)

## 2022-04-18 LAB — LACTATE DEHYDROGENASE: LDH: 160 U/L (ref 98–192)

## 2022-04-18 NOTE — Progress Notes (Signed)
Hematology and Oncology Follow Up Visit  Laura Patrick [id number] 1955/08/11 66 y.o. 04/18/2022   Principle Diagnosis:  Stage IA (T1aN0M0) infiltrating ductal carcinoma of the LEFT breast   --ER+/PR+/HER2- -- Oncotype =15  Current Therapy:   Status post left lumpectomy on 11/27/2021 Radiation therapy --start in 01/21/2022 -- completed on 02/26/2022 Femara 2.5 mg p.o. daily -start after XRT -- start on 03/10/2022     Interim History:  Laura Patrick is back for her follow-up.  She is doing quite nicely.  She really has had no complaints since we last saw her.  The left breast is recovering quite well from the radiation that she took.  She is still working.  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  She is quite busy.  She is on Femara.  She has had no problems with the Femara.  There is no arthralgias or myalgias.  She has had no fever.  She has had no bleeding.  She has had no change in bowel or bladder habits.  Overall, I say performance status is probably ECOG 0.      Medications:  Current Outpatient Medications:    amLODipine (NORVASC) 10 MG tablet, Take 1 tablet (10 mg total) by mouth daily., Disp: 90 tablet, Rfl: 0   letrozole (FEMARA) 2.5 MG tablet, Take 1 tablet (2.5 mg total) by mouth daily., Disp: 30 tablet, Rfl: 12   potassium chloride SA (KLOR-CON M) 20 MEQ tablet, Take 1 tablet (20 mEq total) by mouth daily., Disp: 90 tablet, Rfl: 0   valsartan-hydrochlorothiazide (DIOVAN-HCT) 320-12.5 MG tablet, Take 1 tablet by mouth daily., Disp: 90 tablet, Rfl: 0   Vitamin D, Ergocalciferol, (DRISDOL) 1.25 MG (50000 UNIT) CAPS capsule, Take 1 capsule (50,000 Units total) by mouth every 7 (seven) days., Disp: 12 capsule, Rfl: 1  Allergies:  Allergies  Allergen Reactions   Bee Venom Swelling and Other (See Comments)    Both elbows to the hands, swells, and red water like blisters appear.    Amoxicillin Rash    Past Medical History, Surgical history, Social history,  and Family History were reviewed and updated.  Review of Systems: Review of Systems  Constitutional: Negative.   HENT:  Negative.    Eyes: Negative.   Respiratory: Negative.    Cardiovascular: Negative.   Gastrointestinal: Negative.   Endocrine: Negative.   Genitourinary: Negative.    Musculoskeletal: Negative.   Skin: Negative.   Neurological: Negative.   Hematological: Negative.   Psychiatric/Behavioral: Negative.      Physical Exam:  weight is 190 lb (86.2 kg). Her oral temperature is 98.3 F (36.8 C). Her blood pressure is 125/63 and her pulse is 69. Her respiration is 18 and oxygen saturation is 97%.   Wt Readings from Last 3 Encounters:  04/18/22 190 lb (86.2 kg)  04/01/22 190 lb (86.2 kg)  03/31/22 188 lb 6.4 oz (85.5 kg)    Physical Exam Vitals reviewed.  Constitutional:      Comments: Breast exam shows right breast with no masses, edema or erythema.  There is no right axillary adenopathy.  Left breast shows a well-healed lumpectomy.  This is at the 12 o'clock position.  She has some hyperpigmentation of the left breast from radiation therapy.  There is no skin breakdown.  There is no palpable left axillary lymph nodes.    She has a healing left lymphadenectomy scar.  HENT:     Head: Normocephalic and atraumatic.  Eyes:     Pupils: Pupils are equal,  round, and reactive to light.  Cardiovascular:     Rate and Rhythm: Normal rate and regular rhythm.     Heart sounds: Normal heart sounds.  Pulmonary:     Effort: Pulmonary effort is normal.     Breath sounds: Normal breath sounds.  Abdominal:     General: Bowel sounds are normal.     Palpations: Abdomen is soft.  Musculoskeletal:        General: No tenderness or deformity. Normal range of motion.     Cervical back: Normal range of motion.  Lymphadenopathy:     Cervical: No cervical adenopathy.  Skin:    General: Skin is warm and dry.     Findings: No erythema or rash.  Neurological:     Mental Status: She is  alert and oriented to person, place, and time.  Psychiatric:        Behavior: Behavior normal.        Thought Content: Thought content normal.        Judgment: Judgment normal.      Lab Results  Component Value Date   WBC 4.0 04/18/2022   HGB 14.2 04/18/2022   HCT 43.2 04/18/2022   MCV 85.9 04/18/2022   PLT 277 04/18/2022     Chemistry      Component Value Date/Time   NA 141 04/01/2022 0808   NA 141 01/03/2019 0836   K 3.6 04/01/2022 0808   CL 103 04/01/2022 0808   CO2 26 04/01/2022 0808   BUN 12 04/01/2022 0808   BUN 15 01/03/2019 0836   CREATININE 0.89 04/01/2022 0808   CREATININE 0.92 03/07/2022 1126   CREATININE 0.91 10/06/2016 1529      Component Value Date/Time   CALCIUM 10.3 04/01/2022 0808   ALKPHOS 78 04/01/2022 0808   AST 13 04/01/2022 0808   AST 11 (L) 03/07/2022 1126   ALT 11 04/01/2022 0808   ALT 13 03/07/2022 1126   BILITOT 0.4 04/01/2022 0808   BILITOT 0.5 03/07/2022 1126      Impression and Plan: Laura Patrick is a very nice 66 year old postmenopausal African-American female.  She has a stage Ia infiltrating ductal carcinoma of the LEFT breast.  This has very good prognostic markers.  The Oncotype score is only 15.  She currently is on Femara.  We will keep her on Femara for at least 5 years.  I think this would be very reasonable.  She had a fairly good prognostic breast cancer.  I think we can now move her appointments out to 3 months.  This will get [place] into December.  I am sure that if there are any problems.  She will let us know.    Volanda Napoleon, MD 9/15/20238:21 AM

## 2022-04-18 NOTE — Progress Notes (Signed)
Patient is doing well. No complaints since starting femara.   Oncology Nurse Navigator Documentation     04/18/2022    8:30 AM  Oncology Nurse Navigator Flowsheets  Navigator Follow Up Date: 07/18/2022  Navigator Follow Up Reason: Follow-up Appointment  Navigator Location [facility]  Navigator Encounter Type Follow-up Appt;Appt/Treatment Plan Review  Patient Visit Type MedOnc  Treatment Phase Active Tx  Barriers/Navigation Needs No Barriers At This Time  Interventions Psycho-Social Support  Acuity Level 1-No Barriers  Support Groups/Services Friends and Family  Time Spent with Patient 15

## 2022-05-05 ENCOUNTER — Other Ambulatory Visit (HOSPITAL_BASED_OUTPATIENT_CLINIC_OR_DEPARTMENT_OTHER): Payer: Self-pay

## 2022-05-07 ENCOUNTER — Other Ambulatory Visit (HOSPITAL_BASED_OUTPATIENT_CLINIC_OR_DEPARTMENT_OTHER): Payer: Self-pay

## 2022-05-09 ENCOUNTER — Other Ambulatory Visit (HOSPITAL_BASED_OUTPATIENT_CLINIC_OR_DEPARTMENT_OTHER): Payer: Self-pay

## 2022-05-09 MED ORDER — FLUAD QUADRIVALENT 0.5 ML IM PRSY
PREFILLED_SYRINGE | INTRAMUSCULAR | 0 refills | Status: DC
  Filled 2022-05-09: qty 0.5, 1d supply, fill #0

## 2022-05-12 ENCOUNTER — Ambulatory Visit: Payer: 59 | Attending: Surgery

## 2022-05-12 VITALS — Wt 191.2 lb

## 2022-05-12 DIAGNOSIS — Z483 Aftercare following surgery for neoplasm: Secondary | ICD-10-CM | POA: Insufficient documentation

## 2022-05-12 NOTE — Therapy (Signed)
  OUTPATIENT PHYSICAL THERAPY SOZO SCREENING NOTE   Patient Name: Laura Patrick MRN: [id number] DOB:20-Aug-1955, 66 y.o., female Today's Date: 05/12/2022  PCP: Midge Minium, MD REFERRING PROVIDER: Erroll Luna, MD   PT End of Session - 05/12/22 0940     Visit Number 2   # unchanged due to screen only   PT Start Time 0937    PT Stop Time 0942    PT Time Calculation (min) 5 min    Activity Tolerance Patient tolerated treatment well    Behavior During Therapy [facility] for tasks assessed/performed             Past Medical History:  Diagnosis Date   Anemia    Cancer ([place]) 10/2021   left breast IDC   History of radiation therapy    Left Breast- 01/28/22-02/27/22 Dr. Gery Pray   Hypertension    Ovarian cyst    Stage I breast cancer in female [facility]) 12/27/2021   Past Surgical History:  Procedure Laterality Date   BREAST LUMPECTOMY WITH RADIOACTIVE SEED AND SENTINEL LYMPH NODE BIOPSY Left 11/27/2021   Procedure: LEFT BREAST LUMPECTOMY WITH RADIOACTIVE SEEDS X3 AND SENTINEL LYMPH NODE BIOPSY;  Surgeon: Erroll Luna, MD;  Location: [place];  Service: General;  Laterality: Left;   COLONOSCOPY     OOPHORECTOMY Right 1988   OVARIAN CYST REMOVAL     TOE SURGERY     Patient Active Problem List   Diagnosis Date Noted   Stage I breast cancer in female ([place]) 12/27/2021   Retinal arteriolar sclerosis 11/13/2020   Vitamin D deficiency 12/28/2019   History of vitamin D deficiency 05/29/2017   Physical exam 08/10/2014   Encounter for Papanicolaou smear of cervix 08/10/2014   Morbid obesity ([place]) 10/14/2013   Pain in limb 05/23/2010   PES PLANUS 05/23/2010   PARESTHESIA 05/23/2010   HYPOPOTASSEMIA 08/30/2008   Hyperlipidemia 04/12/2007   SYNDROME, CARPAL TUNNEL 04/12/2007   Essential hypertension 04/12/2007    REFERRING DIAG: left breast cancer at risk for lymphedema  THERAPY DIAG: Aftercare following surgery for neoplasm  PERTINENT HISTORY:  Patient was diagnosed on 10/11/21 with left grade 2. It measures 0.2 cm and is located in the upper outer quadrant. It is ER/PR+, HER2- with a Ki67 of 5%. Pt underwent a L breast lumpectomy and SLNB (0/2) on 11/27/21  PRECAUTIONS: left UE Lymphedema risk, None  SUBJECTIVE: Pt returns for her 3 month L-Dex screen.   PAIN:  Are you having pain? No  SOZO SCREENING: Patient was assessed today using the SOZO machine to determine the lymphedema index score. This was compared to her baseline score. It was determined that she is within the recommended range when compared to her baseline and no further action is needed at this time. She will continue SOZO screenings. These are done every 3 months for 2 years post operatively followed by every 6 months for 2 years, and then annually.   L-DEX FLOWSHEETS - 05/12/22 0900       L-DEX LYMPHEDEMA SCREENING   Measurement Type Unilateral    L-DEX MEASUREMENT EXTREMITY Upper Extremity    POSITION  Standing    DOMINANT SIDE Right    At Risk Side Left    BASELINE SCORE (UNILATERAL) 0.5    L-DEX SCORE (UNILATERAL) 4.7    VALUE CHANGE (UNILAT) 4.2              Otelia Limes, PTA 05/12/2022, 9:42 AM

## 2022-05-26 ENCOUNTER — Ambulatory Visit: Payer: 59

## 2022-05-28 ENCOUNTER — Other Ambulatory Visit (HOSPITAL_BASED_OUTPATIENT_CLINIC_OR_DEPARTMENT_OTHER): Payer: Self-pay

## 2022-06-04 ENCOUNTER — Other Ambulatory Visit (HOSPITAL_BASED_OUTPATIENT_CLINIC_OR_DEPARTMENT_OTHER): Payer: Self-pay

## 2022-06-30 DIAGNOSIS — Z170 Estrogen receptor positive status [ER+]: Secondary | ICD-10-CM | POA: Diagnosis not present

## 2022-06-30 DIAGNOSIS — C50112 Malignant neoplasm of central portion of left female breast: Secondary | ICD-10-CM | POA: Diagnosis not present

## 2022-07-01 ENCOUNTER — Other Ambulatory Visit: Payer: Self-pay | Admitting: Hematology & Oncology

## 2022-07-01 ENCOUNTER — Other Ambulatory Visit (HOSPITAL_BASED_OUTPATIENT_CLINIC_OR_DEPARTMENT_OTHER): Payer: Self-pay

## 2022-07-01 ENCOUNTER — Other Ambulatory Visit: Payer: Self-pay | Admitting: Family Medicine

## 2022-07-01 DIAGNOSIS — I10 Essential (primary) hypertension: Secondary | ICD-10-CM

## 2022-07-01 DIAGNOSIS — Z8639 Personal history of other endocrine, nutritional and metabolic disease: Secondary | ICD-10-CM

## 2022-07-01 MED ORDER — VITAMIN D (ERGOCALCIFEROL) 1.25 MG (50000 UNIT) PO CAPS
50000.0000 [IU] | ORAL_CAPSULE | ORAL | 1 refills | Status: DC
  Filled 2022-07-01 – 2022-08-21 (×2): qty 12, 84d supply, fill #0
  Filled 2022-11-17: qty 12, 84d supply, fill #1

## 2022-07-01 MED ORDER — POTASSIUM CHLORIDE CRYS ER 20 MEQ PO TBCR
20.0000 meq | EXTENDED_RELEASE_TABLET | Freq: Every day | ORAL | 0 refills | Status: DC
  Filled 2022-07-01: qty 74, 74d supply, fill #0
  Filled 2022-07-01: qty 16, 16d supply, fill #0
  Filled 2022-07-01: qty 90, 90d supply, fill #0

## 2022-07-01 MED ORDER — VALSARTAN-HYDROCHLOROTHIAZIDE 320-12.5 MG PO TABS
1.0000 | ORAL_TABLET | Freq: Every day | ORAL | 0 refills | Status: DC
  Filled 2022-07-01: qty 90, 90d supply, fill #0

## 2022-07-01 MED ORDER — AMLODIPINE BESYLATE 10 MG PO TABS
10.0000 mg | ORAL_TABLET | Freq: Every day | ORAL | 0 refills | Status: DC
  Filled 2022-07-01: qty 90, 90d supply, fill #0

## 2022-07-18 ENCOUNTER — Encounter: Payer: Self-pay | Admitting: *Deleted

## 2022-07-18 ENCOUNTER — Inpatient Hospital Stay: Payer: 59 | Attending: Hematology & Oncology

## 2022-07-18 ENCOUNTER — Inpatient Hospital Stay: Payer: 59 | Admitting: Family

## 2022-07-18 ENCOUNTER — Other Ambulatory Visit: Payer: Self-pay

## 2022-07-18 ENCOUNTER — Encounter: Payer: Self-pay | Admitting: Family

## 2022-07-18 VITALS — BP 132/69 | HR 66 | Temp 98.0°F | Resp 18 | Ht 60.0 in | Wt 193.1 lb

## 2022-07-18 DIAGNOSIS — C50919 Malignant neoplasm of unspecified site of unspecified female breast: Secondary | ICD-10-CM

## 2022-07-18 DIAGNOSIS — Z170 Estrogen receptor positive status [ER+]: Secondary | ICD-10-CM | POA: Diagnosis not present

## 2022-07-18 DIAGNOSIS — Z880 Allergy status to penicillin: Secondary | ICD-10-CM | POA: Diagnosis not present

## 2022-07-18 DIAGNOSIS — Z91030 Bee allergy status: Secondary | ICD-10-CM | POA: Diagnosis not present

## 2022-07-18 DIAGNOSIS — R232 Flushing: Secondary | ICD-10-CM | POA: Insufficient documentation

## 2022-07-18 DIAGNOSIS — Z79899 Other long term (current) drug therapy: Secondary | ICD-10-CM | POA: Insufficient documentation

## 2022-07-18 DIAGNOSIS — R61 Generalized hyperhidrosis: Secondary | ICD-10-CM | POA: Insufficient documentation

## 2022-07-18 DIAGNOSIS — C50212 Malignant neoplasm of upper-inner quadrant of left female breast: Secondary | ICD-10-CM | POA: Insufficient documentation

## 2022-07-18 DIAGNOSIS — Z923 Personal history of irradiation: Secondary | ICD-10-CM | POA: Insufficient documentation

## 2022-07-18 DIAGNOSIS — R202 Paresthesia of skin: Secondary | ICD-10-CM | POA: Diagnosis not present

## 2022-07-18 DIAGNOSIS — Z79811 Long term (current) use of aromatase inhibitors: Secondary | ICD-10-CM | POA: Insufficient documentation

## 2022-07-18 LAB — CBC WITH DIFFERENTIAL (CANCER CENTER ONLY)
Abs Immature Granulocytes: 0.01 10*3/uL (ref 0.00–0.07)
Basophils Absolute: 0 10*3/uL (ref 0.0–0.1)
Basophils Relative: 1 %
Eosinophils Absolute: 0.1 10*3/uL (ref 0.0–0.5)
Eosinophils Relative: 2 %
HCT: 40.7 % (ref 36.0–46.0)
Hemoglobin: 13.4 g/dL (ref 12.0–15.0)
Immature Granulocytes: 0 %
Lymphocytes Relative: 34 %
Lymphs Abs: 1.5 10*3/uL (ref 0.7–4.0)
MCH: 28.3 pg (ref 26.0–34.0)
MCHC: 32.9 g/dL (ref 30.0–36.0)
MCV: 85.9 fL (ref 80.0–100.0)
Monocytes Absolute: 0.5 10*3/uL (ref 0.1–1.0)
Monocytes Relative: 12 %
Neutro Abs: 2.3 10*3/uL (ref 1.7–7.7)
Neutrophils Relative %: 51 %
Platelet Count: 303 10*3/uL (ref 150–400)
RBC: 4.74 MIL/uL (ref 3.87–5.11)
RDW: 13.4 % (ref 11.5–15.5)
WBC Count: 4.4 10*3/uL (ref 4.0–10.5)
nRBC: 0 % (ref 0.0–0.2)

## 2022-07-18 LAB — CMP (CANCER CENTER ONLY)
ALT: 12 U/L (ref 0–44)
AST: 14 U/L — ABNORMAL LOW (ref 15–41)
Albumin: 4.8 g/dL (ref 3.5–5.0)
Alkaline Phosphatase: 66 U/L (ref 38–126)
Anion gap: 8 (ref 5–15)
BUN: 16 mg/dL (ref 8–23)
CO2: 29 mmol/L (ref 22–32)
Calcium: 10.1 mg/dL (ref 8.9–10.3)
Chloride: 104 mmol/L (ref 98–111)
Creatinine: 1.05 mg/dL — ABNORMAL HIGH (ref 0.44–1.00)
GFR, Estimated: 58 mL/min — ABNORMAL LOW (ref >60)
Glucose, Bld: 113 mg/dL — ABNORMAL HIGH (ref 70–99)
Potassium: 4.1 mmol/L (ref 3.5–5.1)
Sodium: 141 mmol/L (ref 135–145)
Total Bilirubin: 0.5 mg/dL (ref 0.3–1.2)
Total Protein: 8.5 g/dL — ABNORMAL HIGH (ref 6.5–8.1)

## 2022-07-18 LAB — LACTATE DEHYDROGENASE: LDH: 163 U/L (ref 98–192)

## 2022-07-18 NOTE — Progress Notes (Signed)
Hematology and Oncology Follow Up Visit  Laura Patrick [id number] 1956/05/18 66 y.o. 07/18/2022   Principle Diagnosis:  Stage IA (T1aN0M0) infiltrating ductal carcinoma of the LEFT breast   --ER+/PR+/HER2- -- Oncotype =15  Past Therapy: Status post left lumpectomy on 11/27/2021 Radiation therapy --start in 01/21/2022 -- completed on 02/26/2022   Current Therapy:        Femara 2.5 mg p.o. daily -start after XRT -- started 03/10/2022   Interim History:  Laura Patrick is here today for follow-up. She is doing quite well. She has had some fluid retention in her left breast post surgery and radiation therapy. She has been wearing a compression bra which she feels helps improve the swelling. We discussed a referral to PT lymphedema clinic but she would like to hold off for now. She will let us know if anything changes.  No mass, lesion or rash on bilateral breast exam. No adenopathy noted on exam.  She is taking her Femara daily as directed.  She notes intermittent hot flashes and night sweats which seem less often now.  No fever, chills, n/v, cough, rash, dizziness, SOB, chest pain, palpitations, abdominal pain or changes in bowel or bladder habits.  No blood loss, bruising or petechiae.  No swelling or tenderness in her extremities.  Tingling in her fingertips related to carpal tunnel unchanged from baseline. She wears braces at night which helps.  No falls or syncope reported.  Appetite is good. She admits that she needs to better hydrate throughout the day with water. Her weight is stable at 193 lbs.   ECOG Performance Status: 1 - Symptomatic but completely ambulatory  Medications:  Allergies as of 07/18/2022       Reactions   Bee Venom Swelling, Other (See Comments)   Both elbows to the hands, swells, and red water like blisters appear.    Amoxicillin Rash        Medication List        Accurate as of July 18, 2022  8:57 AM. If you have any questions, ask your  nurse or doctor.          amLODipine 10 MG tablet Commonly known as: NORVASC Take 1 tablet (10 mg total) by mouth daily.   Fluad Quadrivalent 0.5 ML injection Generic drug: influenza vaccine adjuvanted Inject into the muscle.   letrozole 2.5 MG tablet Commonly known as: FEMARA Take 1 tablet (2.5 mg total) by mouth daily.   potassium chloride SA 20 MEQ tablet Commonly known as: KLOR-CON M Take 1 tablet (20 mEq total) by mouth daily.   valsartan-hydrochlorothiazide 320-12.5 MG tablet Commonly known as: DIOVAN-HCT Take 1 tablet by mouth daily.   Vitamin D (Ergocalciferol) 1.25 MG (50000 UNIT) Caps capsule Commonly known as: DRISDOL Take 1 capsule (50,000 Units total) by mouth every 7 (seven) days.        Allergies:  Allergies  Allergen Reactions   Bee Venom Swelling and Other (See Comments)    Both elbows to the hands, swells, and red water like blisters appear.    Amoxicillin Rash    Past Medical History, Surgical history, Social history, and Family History were reviewed and updated.  Review of Systems: All other 10 point review of systems is negative.   Physical Exam:  vitals were not taken for this visit.   Wt Readings from Last 3 Encounters:  05/12/22 191 lb 4 oz (86.8 kg)  04/18/22 190 lb (86.2 kg)  04/01/22 190 lb (86.2 kg)    Ocular:  Sclerae unicteric, pupils equal, round and reactive to light Ear-nose-throat: Oropharynx clear, dentition fair Lymphatic: No cervical, supraclavicular or axillary adenopathy Lungs no rales or rhonchi, good excursion bilaterally Heart regular rate and rhythm, no murmur appreciated Abd soft, nontender, positive bowel sounds MSK no focal spinal tenderness, no joint edema Neuro: non-focal, well-oriented, appropriate affect Breasts: fluid retention mild in the left breast, no mass, lesion or rash noted on bilateral breast exam.   Lab Results  Component Value Date   WBC 4.4 07/18/2022   HGB 13.4 07/18/2022   HCT 40.7  07/18/2022   MCV 85.9 07/18/2022   PLT 303 07/18/2022   No results found for: "FERRITIN", "IRON", "TIBC", "UIBC", "IRONPCTSAT" Lab Results  Component Value Date   RBC 4.74 07/18/2022   No results found for: "KPAFRELGTCHN", "LAMBDASER", "KAPLAMBRATIO" No results found for: "IGGSERUM", "IGA", "IGMSERUM" No results found for: "TOTALPROTELP", "ALBUMINELP", "A1GS", "A2GS", "BETS", "BETA2SER", "GAMS", "MSPIKE", "SPEI"   Chemistry      Component Value Date/Time   NA 141 07/18/2022 0814   NA 141 01/03/2019 0836   K 4.1 07/18/2022 0814   CL 104 07/18/2022 0814   CO2 29 07/18/2022 0814   BUN 16 07/18/2022 0814   BUN 15 01/03/2019 0836   CREATININE 1.05 (H) 07/18/2022 0814   CREATININE 0.91 10/06/2016 1529      Component Value Date/Time   CALCIUM 10.1 07/18/2022 0814   ALKPHOS 66 07/18/2022 0814   AST 14 (L) 07/18/2022 0814   ALT 12 07/18/2022 0814   BILITOT 0.5 07/18/2022 0814       Impression and Plan: Laura Patrick is a very pleasant 66 yo postmenopausal African American female with stage 1a infiltrating ductal carcinoma of the left breast with very good prognostic markers, Oncotype score 15.  She will continue her same regimen with Femara.  Follow-up in 3 months.   Lottie Dawson, NP 12/15/20238:57 AM

## 2022-07-18 NOTE — Progress Notes (Signed)
Patient is doing well on her maintenance femara. She has no navigational needs. Will discontinue active navigation at this time. Will be available to the patient in the future as needed.   Oncology Nurse Navigator Documentation     07/18/2022    9:15 AM  Oncology Nurse Navigator Flowsheets  Navigation Complete Date: 07/18/2022  Post Navigation: Continue to Follow Patient? No  Reason Not Navigating Patient: Patient On Maintenance Chemotherapy  Navigator Location [facility]  Navigator Encounter Type Follow-up Appt;Appt/Treatment Plan Review  Patient Visit Type MedOnc  Treatment Phase Active Tx  Barriers/Navigation Needs No Barriers At This Time  Interventions None Required  Acuity Level 1-No Barriers  Support Groups/Services Friends and Family  Time Spent with Patient 15

## 2022-07-29 ENCOUNTER — Other Ambulatory Visit (HOSPITAL_BASED_OUTPATIENT_CLINIC_OR_DEPARTMENT_OTHER): Payer: Self-pay

## 2022-08-21 ENCOUNTER — Other Ambulatory Visit (HOSPITAL_BASED_OUTPATIENT_CLINIC_OR_DEPARTMENT_OTHER): Payer: Self-pay

## 2022-08-25 ENCOUNTER — Ambulatory Visit: Payer: Commercial Managed Care - PPO | Attending: Surgery

## 2022-08-25 VITALS — Wt 194.2 lb

## 2022-08-25 DIAGNOSIS — Z483 Aftercare following surgery for neoplasm: Secondary | ICD-10-CM | POA: Insufficient documentation

## 2022-08-25 NOTE — Therapy (Signed)
  OUTPATIENT PHYSICAL THERAPY SOZO SCREENING NOTE   Patient Name: Laura Patrick MRN: [id number] DOB:08-21-1955, 67 y.o., female Today's Date: 08/25/2022  PCP: Midge Minium, MD REFERRING PROVIDER: Erroll Luna, MD   PT End of Session - 08/25/22 0900     Visit Number 2   # unchanged due to screen only   PT Start Time 1017    PT Stop Time 0906    PT Time Calculation (min) 8 min    Activity Tolerance Patient tolerated treatment well    Behavior During Therapy [facility] for tasks assessed/performed             Past Medical History:  Diagnosis Date   Anemia    Cancer ([place]) 10/2021   left breast IDC   History of radiation therapy    Left Breast- 01/28/22-02/27/22 Dr. Gery Pray   Hypertension    Ovarian cyst    Stage I breast cancer in female [facility]) 12/27/2021   Past Surgical History:  Procedure Laterality Date   BREAST LUMPECTOMY WITH RADIOACTIVE SEED AND SENTINEL LYMPH NODE BIOPSY Left 11/27/2021   Procedure: LEFT BREAST LUMPECTOMY WITH RADIOACTIVE SEEDS X3 AND SENTINEL LYMPH NODE BIOPSY;  Surgeon: Erroll Luna, MD;  Location: [place];  Service: General;  Laterality: Left;   COLONOSCOPY     OOPHORECTOMY Right 1988   OVARIAN CYST REMOVAL     TOE SURGERY     Patient Active Problem List   Diagnosis Date Noted   Stage I breast cancer in female ([place]) 12/27/2021   Retinal arteriolar sclerosis 11/13/2020   Vitamin D deficiency 12/28/2019   History of vitamin D deficiency 05/29/2017   Physical exam 08/10/2014   Encounter for Papanicolaou smear of cervix 08/10/2014   Morbid obesity ([place]) 10/14/2013   Pain in limb 05/23/2010   PES PLANUS 05/23/2010   PARESTHESIA 05/23/2010   HYPOPOTASSEMIA 08/30/2008   Hyperlipidemia 04/12/2007   SYNDROME, CARPAL TUNNEL 04/12/2007   Essential hypertension 04/12/2007    REFERRING DIAG: left breast cancer at risk for lymphedema  THERAPY DIAG: Aftercare following surgery for neoplasm  PERTINENT HISTORY:  Patient was diagnosed on 10/11/21 with left grade 2. It measures 0.2 cm and is located in the upper outer quadrant. It is ER/PR+, HER2- with a Ki67 of 5%. Pt underwent a L breast lumpectomy and SLNB (0/2) on 11/27/21  PRECAUTIONS: left UE Lymphedema risk, None  SUBJECTIVE: Pt returns for her 3 month L-Dex screen.   PAIN:  Are you having pain? No  SOZO SCREENING: Patient was assessed today using the SOZO machine to determine the lymphedema index score. This was compared to her baseline score. It was determined that she is within the recommended range when compared to her baseline and no further action is needed at this time. She will continue SOZO screenings. These are done every 3 months for 2 years post operatively followed by every 6 months for 2 years, and then annually.   L-DEX FLOWSHEETS - 08/25/22 0900       L-DEX LYMPHEDEMA SCREENING   Measurement Type Unilateral    L-DEX MEASUREMENT EXTREMITY Upper Extremity    POSITION  Standing    DOMINANT SIDE Right    At Risk Side Left    BASELINE SCORE (UNILATERAL) 0.5    L-DEX SCORE (UNILATERAL) 1.5    VALUE CHANGE (UNILAT) 1              Otelia Limes, PTA 08/25/2022, 9:06 AM

## 2022-09-02 ENCOUNTER — Other Ambulatory Visit (HOSPITAL_BASED_OUTPATIENT_CLINIC_OR_DEPARTMENT_OTHER): Payer: Self-pay

## 2022-09-03 DIAGNOSIS — C50912 Malignant neoplasm of unspecified site of left female breast: Secondary | ICD-10-CM | POA: Diagnosis not present

## 2022-09-29 ENCOUNTER — Other Ambulatory Visit (HOSPITAL_BASED_OUTPATIENT_CLINIC_OR_DEPARTMENT_OTHER): Payer: Self-pay

## 2022-09-29 ENCOUNTER — Other Ambulatory Visit: Payer: Self-pay | Admitting: Family Medicine

## 2022-09-29 DIAGNOSIS — I10 Essential (primary) hypertension: Secondary | ICD-10-CM

## 2022-09-29 MED ORDER — POTASSIUM CHLORIDE CRYS ER 20 MEQ PO TBCR
20.0000 meq | EXTENDED_RELEASE_TABLET | Freq: Every day | ORAL | 0 refills | Status: DC
  Filled 2022-09-29: qty 90, 90d supply, fill #0

## 2022-09-29 MED ORDER — AMLODIPINE BESYLATE 10 MG PO TABS
10.0000 mg | ORAL_TABLET | Freq: Every day | ORAL | 0 refills | Status: DC
  Filled 2022-09-29: qty 90, 90d supply, fill #0

## 2022-09-29 MED ORDER — VALSARTAN-HYDROCHLOROTHIAZIDE 320-12.5 MG PO TABS
1.0000 | ORAL_TABLET | Freq: Every day | ORAL | 0 refills | Status: DC
  Filled 2022-09-29: qty 90, 90d supply, fill #0

## 2022-10-17 ENCOUNTER — Inpatient Hospital Stay: Payer: Commercial Managed Care - PPO | Attending: Hematology & Oncology

## 2022-10-17 ENCOUNTER — Encounter: Payer: Self-pay | Admitting: Family

## 2022-10-17 ENCOUNTER — Inpatient Hospital Stay: Payer: Commercial Managed Care - PPO | Admitting: Family

## 2022-10-17 VITALS — BP 128/71 | HR 88 | Temp 98.7°F | Wt 192.0 lb

## 2022-10-17 DIAGNOSIS — C50212 Malignant neoplasm of upper-inner quadrant of left female breast: Secondary | ICD-10-CM | POA: Insufficient documentation

## 2022-10-17 DIAGNOSIS — C50019 Malignant neoplasm of nipple and areola, unspecified female breast: Secondary | ICD-10-CM | POA: Diagnosis not present

## 2022-10-17 DIAGNOSIS — Z79811 Long term (current) use of aromatase inhibitors: Secondary | ICD-10-CM | POA: Diagnosis not present

## 2022-10-17 DIAGNOSIS — Z923 Personal history of irradiation: Secondary | ICD-10-CM | POA: Insufficient documentation

## 2022-10-17 DIAGNOSIS — R609 Edema, unspecified: Secondary | ICD-10-CM | POA: Diagnosis not present

## 2022-10-17 DIAGNOSIS — Z170 Estrogen receptor positive status [ER+]: Secondary | ICD-10-CM | POA: Insufficient documentation

## 2022-10-17 DIAGNOSIS — C50919 Malignant neoplasm of unspecified site of unspecified female breast: Secondary | ICD-10-CM | POA: Diagnosis not present

## 2022-10-17 DIAGNOSIS — Z91030 Bee allergy status: Secondary | ICD-10-CM | POA: Diagnosis not present

## 2022-10-17 DIAGNOSIS — Z880 Allergy status to penicillin: Secondary | ICD-10-CM | POA: Insufficient documentation

## 2022-10-17 DIAGNOSIS — Z79899 Other long term (current) drug therapy: Secondary | ICD-10-CM | POA: Insufficient documentation

## 2022-10-17 DIAGNOSIS — R202 Paresthesia of skin: Secondary | ICD-10-CM | POA: Diagnosis not present

## 2022-10-17 LAB — CBC WITH DIFFERENTIAL (CANCER CENTER ONLY)
Abs Immature Granulocytes: 0.01 10*3/uL (ref 0.00–0.07)
Basophils Absolute: 0 10*3/uL (ref 0.0–0.1)
Basophils Relative: 1 %
Eosinophils Absolute: 0.1 10*3/uL (ref 0.0–0.5)
Eosinophils Relative: 2 %
HCT: 41.1 % (ref 36.0–46.0)
Hemoglobin: 13.9 g/dL (ref 12.0–15.0)
Immature Granulocytes: 0 %
Lymphocytes Relative: 40 %
Lymphs Abs: 1.6 10*3/uL (ref 0.7–4.0)
MCH: 28.8 pg (ref 26.0–34.0)
MCHC: 33.8 g/dL (ref 30.0–36.0)
MCV: 85.3 fL (ref 80.0–100.0)
Monocytes Absolute: 0.5 10*3/uL (ref 0.1–1.0)
Monocytes Relative: 12 %
Neutro Abs: 1.8 10*3/uL (ref 1.7–7.7)
Neutrophils Relative %: 45 %
Platelet Count: 319 10*3/uL (ref 150–400)
RBC: 4.82 MIL/uL (ref 3.87–5.11)
RDW: 13.5 % (ref 11.5–15.5)
WBC Count: 3.9 10*3/uL — ABNORMAL LOW (ref 4.0–10.5)
nRBC: 0 % (ref 0.0–0.2)

## 2022-10-17 LAB — CMP (CANCER CENTER ONLY)
ALT: 14 U/L (ref 0–44)
AST: 13 U/L — ABNORMAL LOW (ref 15–41)
Albumin: 4.8 g/dL (ref 3.5–5.0)
Alkaline Phosphatase: 87 U/L (ref 38–126)
Anion gap: 9 (ref 5–15)
BUN: 12 mg/dL (ref 8–23)
CO2: 28 mmol/L (ref 22–32)
Calcium: 10.2 mg/dL (ref 8.9–10.3)
Chloride: 103 mmol/L (ref 98–111)
Creatinine: 0.89 mg/dL (ref 0.44–1.00)
GFR, Estimated: >60 mL/min (ref >60)
Glucose, Bld: 109 mg/dL — ABNORMAL HIGH (ref 70–99)
Potassium: 3.8 mmol/L (ref 3.5–5.1)
Sodium: 140 mmol/L (ref 135–145)
Total Bilirubin: 0.4 mg/dL (ref 0.3–1.2)
Total Protein: 8.7 g/dL — ABNORMAL HIGH (ref 6.5–8.1)

## 2022-10-17 LAB — LACTATE DEHYDROGENASE: LDH: 167 U/L (ref 98–192)

## 2022-10-17 NOTE — Progress Notes (Signed)
Hematology and Oncology Follow Up Visit  Laura Patrick [id number] 05-24-1956 67 y.o. 10/17/2022   Principle Diagnosis:  Stage IA (T1aN0M0) infiltrating ductal carcinoma of the LEFT breast   --ER+/PR+/HER2- -- Oncotype =15   Past Therapy: Status post left lumpectomy on 11/27/2021 Radiation therapy --start in 01/21/2022 -- completed on 02/26/2022   Current Therapy:        Femara 2.5 mg PO daily -start after XRT -- started 03/10/2022   Interim History:  Laura Patrick is here today for follow-up. She is doing quite well. She has noted some stinging in the right breast that comes and goes.  Left breast fluid retention much improved. No mass, lesion or rash noted in either breast.  No adenopathy noted on exam.  No fever, chills, n/v, cough, rash, dizziness, SOB, chest pain, palpitations, abdominal pain or changes in bowel or bladder habits.  No swelling or tenderness in her extremities.  Tingling in her hands with carpal tunnel.  No falls or syncope reported.  Appetite is good but she admits that she needs to hydrate better throughout the day. Weight is stable at 192 lbs.   ECOG Performance Status: 1 - Symptomatic but completely ambulatory  Medications:  Allergies as of 10/17/2022       Reactions   Bee Venom Swelling, Other (See Comments)   Both elbows to the hands, swells, and red water like blisters appear.    Amoxicillin Rash        Medication List        Accurate as of October 17, 2022  8:42 AM. If you have any questions, ask your nurse or doctor.          amLODipine 10 MG tablet Commonly known as: NORVASC Take 1 tablet (10 mg total) by mouth daily.   letrozole 2.5 MG tablet Commonly known as: FEMARA Take 1 tablet (2.5 mg total) by mouth daily.   potassium chloride SA 20 MEQ tablet Commonly known as: KLOR-CON M Take 1 tablet (20 mEq total) by mouth daily.   valsartan-hydrochlorothiazide 320-12.5 MG tablet Commonly known as: DIOVAN-HCT Take 1 tablet by  mouth daily.   Vitamin D (Ergocalciferol) 1.25 MG (50000 UNIT) Caps capsule Commonly known as: DRISDOL Take 1 capsule (50,000 Units total) by mouth every 7 (seven) days.        Allergies:  Allergies  Allergen Reactions   Bee Venom Swelling and Other (See Comments)    Both elbows to the hands, swells, and red water like blisters appear.    Amoxicillin Rash    Past Medical History, Surgical history, Social history, and Family History were reviewed and updated.  Review of Systems: All other 10 point review of systems is negative.   Physical Exam:  weight is 192 lb (87.1 kg). Her oral temperature is 98.7 F (37.1 C). Her blood pressure is 128/71 and her pulse is 88. Her oxygen saturation is 100%.   Wt Readings from Last 3 Encounters:  10/17/22 192 lb (87.1 kg)  08/25/22 194 lb 4 oz (88.1 kg)  07/18/22 193 lb 1.9 oz (87.6 kg)    Ocular: Sclerae unicteric, pupils equal, round and reactive to light Ear-nose-throat: Oropharynx clear, dentition fair Lymphatic: No cervical, supraclavicular or axillary adenopathy Lungs no rales or rhonchi, good excursion bilaterally Heart regular rate and rhythm, no murmur appreciated Abd soft, nontender, positive bowel sounds MSK no focal spinal tenderness, no joint edema Neuro: non-focal, well-oriented, appropriate affect Breasts: Same as above.   Lab Results  Component Value Date  WBC 3.9 (L) 10/17/2022   HGB 13.9 10/17/2022   HCT 41.1 10/17/2022   MCV 85.3 10/17/2022   PLT 319 10/17/2022   No results found for: "FERRITIN", "IRON", "TIBC", "UIBC", "IRONPCTSAT" Lab Results  Component Value Date   RBC 4.82 10/17/2022   No results found for: "KPAFRELGTCHN", "LAMBDASER", "KAPLAMBRATIO" No results found for: "IGGSERUM", "IGA", "IGMSERUM" No results found for: "TOTALPROTELP", "ALBUMINELP", "A1GS", "A2GS", "BETS", "BETA2SER", "GAMS", "MSPIKE", "SPEI"   Chemistry      Component Value Date/Time   NA 141 07/18/2022 0814   NA 141  01/03/2019 0836   K 4.1 07/18/2022 0814   CL 104 07/18/2022 0814   CO2 29 07/18/2022 0814   BUN 16 07/18/2022 0814   BUN 15 01/03/2019 0836   CREATININE 1.05 (H) 07/18/2022 0814   CREATININE 0.91 10/06/2016 1529      Component Value Date/Time   CALCIUM 10.1 07/18/2022 0814   ALKPHOS 66 07/18/2022 0814   AST 14 (L) 07/18/2022 0814   ALT 12 07/18/2022 0814   BILITOT 0.5 07/18/2022 0814       Impression and Plan: Laura Patrick is a very pleasant 67 yo postmenopausal African American female with stage 1a infiltrating ductal carcinoma of the left breast with very good prognostic markers, Oncotype score 15.  We will get a mammogram next week if possible to assess stinging in right breast.  She will continue her same regimen with Femara.  Follow-up in 3 months.   Lottie Dawson, NP 3/15/20248:42 AM

## 2022-10-28 ENCOUNTER — Other Ambulatory Visit (HOSPITAL_BASED_OUTPATIENT_CLINIC_OR_DEPARTMENT_OTHER): Payer: Self-pay

## 2022-11-04 ENCOUNTER — Other Ambulatory Visit (HOSPITAL_BASED_OUTPATIENT_CLINIC_OR_DEPARTMENT_OTHER): Payer: Self-pay

## 2022-11-05 ENCOUNTER — Other Ambulatory Visit (HOSPITAL_BASED_OUTPATIENT_CLINIC_OR_DEPARTMENT_OTHER): Payer: Self-pay

## 2022-11-06 ENCOUNTER — Other Ambulatory Visit (HOSPITAL_COMMUNITY): Payer: Self-pay | Admitting: Family

## 2022-11-06 DIAGNOSIS — Z9889 Other specified postprocedural states: Secondary | ICD-10-CM

## 2022-11-17 ENCOUNTER — Other Ambulatory Visit (HOSPITAL_BASED_OUTPATIENT_CLINIC_OR_DEPARTMENT_OTHER): Payer: Self-pay

## 2022-11-20 ENCOUNTER — Other Ambulatory Visit (HOSPITAL_COMMUNITY): Payer: Self-pay | Admitting: Family

## 2022-11-20 ENCOUNTER — Ambulatory Visit (HOSPITAL_COMMUNITY)
Admission: RE | Admit: 2022-11-20 | Discharge: 2022-11-20 | Disposition: A | Discharge status: Home / Self Care | Payer: Commercial Managed Care - PPO | Source: Ambulatory Visit | Attending: Family | Admitting: Family

## 2022-11-20 ENCOUNTER — Encounter (HOSPITAL_COMMUNITY): Payer: Self-pay

## 2022-11-20 DIAGNOSIS — C50019 Malignant neoplasm of nipple and areola, unspecified female breast: Secondary | ICD-10-CM

## 2022-11-20 DIAGNOSIS — Z9889 Other specified postprocedural states: Secondary | ICD-10-CM | POA: Insufficient documentation

## 2022-11-20 DIAGNOSIS — Z853 Personal history of malignant neoplasm of breast: Secondary | ICD-10-CM | POA: Diagnosis not present

## 2022-11-20 DIAGNOSIS — N6321 Unspecified lump in the left breast, upper outer quadrant: Secondary | ICD-10-CM | POA: Diagnosis not present

## 2022-11-20 DIAGNOSIS — C50919 Malignant neoplasm of unspecified site of unspecified female breast: Secondary | ICD-10-CM

## 2022-11-20 DIAGNOSIS — R928 Other abnormal and inconclusive findings on diagnostic imaging of breast: Secondary | ICD-10-CM

## 2022-11-24 ENCOUNTER — Ambulatory Visit: Payer: Commercial Managed Care - PPO

## 2022-11-25 ENCOUNTER — Other Ambulatory Visit (HOSPITAL_COMMUNITY): Payer: Commercial Managed Care - PPO

## 2022-12-01 ENCOUNTER — Other Ambulatory Visit (HOSPITAL_COMMUNITY): Payer: Self-pay | Admitting: Family

## 2022-12-01 DIAGNOSIS — R928 Other abnormal and inconclusive findings on diagnostic imaging of breast: Secondary | ICD-10-CM

## 2022-12-02 ENCOUNTER — Ambulatory Visit (HOSPITAL_COMMUNITY)
Admission: RE | Admit: 2022-12-02 | Discharge: 2022-12-02 | Disposition: A | Discharge status: Home / Self Care | Payer: Commercial Managed Care - PPO | Source: Ambulatory Visit | Attending: Family | Admitting: Family

## 2022-12-02 ENCOUNTER — Encounter (HOSPITAL_COMMUNITY): Payer: Self-pay

## 2022-12-02 ENCOUNTER — Other Ambulatory Visit (HOSPITAL_COMMUNITY): Payer: Self-pay | Admitting: Family

## 2022-12-02 ENCOUNTER — Ambulatory Visit: Payer: Commercial Managed Care - PPO | Attending: Surgery | Admitting: Physical Therapy

## 2022-12-02 DIAGNOSIS — Z170 Estrogen receptor positive status [ER+]: Secondary | ICD-10-CM | POA: Insufficient documentation

## 2022-12-02 DIAGNOSIS — R590 Localized enlarged lymph nodes: Secondary | ICD-10-CM | POA: Diagnosis not present

## 2022-12-02 DIAGNOSIS — C50212 Malignant neoplasm of upper-inner quadrant of left female breast: Secondary | ICD-10-CM

## 2022-12-02 DIAGNOSIS — R928 Other abnormal and inconclusive findings on diagnostic imaging of breast: Secondary | ICD-10-CM

## 2022-12-02 DIAGNOSIS — N6323 Unspecified lump in the left breast, lower outer quadrant: Secondary | ICD-10-CM | POA: Diagnosis not present

## 2022-12-02 HISTORY — PX: BREAST BIOPSY: SHX20

## 2022-12-02 MED ORDER — LIDOCAINE HCL (PF) 2 % IJ SOLN
10.0000 mL | Freq: Once | INTRAMUSCULAR | Status: AC
  Administered 2022-12-02: 10 mL

## 2022-12-02 MED ORDER — LIDOCAINE-EPINEPHRINE (PF) 1 %-1:200000 IJ SOLN
INTRAMUSCULAR | Status: AC
  Filled 2022-12-02: qty 30

## 2022-12-02 MED ORDER — LIDOCAINE HCL (PF) 2 % IJ SOLN
INTRAMUSCULAR | Status: AC
  Filled 2022-12-02: qty 10

## 2022-12-02 MED ORDER — LIDOCAINE-EPINEPHRINE (PF) 1 %-1:200000 IJ SOLN
10.0000 mL | Freq: Once | INTRAMUSCULAR | Status: AC
  Administered 2022-12-02: 10 mL via INTRADERMAL

## 2022-12-02 NOTE — Therapy (Signed)
OUTPATIENT PHYSICAL THERAPY SOZO SCREENING NOTE   Patient Name: Laura Patrick MRN: [id number] DOB:[birth year], 67 y.o., female Today's Date: 12/02/2022  PCP: Sheliah Hatch, MD REFERRING PROVIDER: Harriette Bouillon, MD   PT End of Session - 12/02/22 1447     Visit Number 2   unchanged due to screen only   PT Start Time 1440    PT Stop Time 1447    PT Time Calculation (min) 7 min    Activity Tolerance Patient tolerated treatment well    Behavior During Therapy [facility] for tasks assessed/performed              Past Medical History:  Diagnosis Date   Anemia    Cancer (HCC) 10/2021   left breast IDC   History of radiation therapy    Left Breast- 01/28/22-02/27/22 Dr. Antony Blackbird   Hypertension    Ovarian cyst    Stage I breast cancer in female [facility]) 12/27/2021   Past Surgical History:  Procedure Laterality Date   BREAST BIOPSY Left 12/02/2022   [place] LT BREAST BX W LOC DEV 1ST LESION IMG BX SPEC US GUIDE 12/02/2022 AP-ULTRASOUND   BREAST BIOPSY Left 12/02/2022   [place] LT BREAST BX W LOC DEV EA ADD LESION IMG BX SPEC US GUIDE 12/02/2022 AP-ULTRASOUND   BREAST LUMPECTOMY WITH RADIOACTIVE SEED AND SENTINEL LYMPH NODE BIOPSY Left 11/27/2021   Procedure: LEFT BREAST LUMPECTOMY WITH RADIOACTIVE SEEDS X3 AND SENTINEL LYMPH NODE BIOPSY;  Surgeon: Harriette Bouillon, MD;  Location: [facility];  Service: General;  Laterality: Left;   COLONOSCOPY     OOPHORECTOMY Right 1988   OVARIAN CYST REMOVAL     TOE SURGERY     Patient Active Problem List   Diagnosis Date Noted   Stage I breast cancer in female (HCC) 12/27/2021   Retinal arteriolar sclerosis 11/13/2020   Vitamin D deficiency 12/28/2019   History of vitamin D deficiency 05/29/2017   Physical exam 08/10/2014   Encounter for Papanicolaou smear of cervix 08/10/2014   Morbid obesity (HCC) 10/14/2013   Pain in limb 05/23/2010   PES PLANUS 05/23/2010   PARESTHESIA 05/23/2010   HYPOPOTASSEMIA 08/30/2008    Hyperlipidemia 04/12/2007   SYNDROME, CARPAL TUNNEL 04/12/2007   Essential hypertension 04/12/2007    REFERRING DIAG: left breast cancer at risk for lymphedema  THERAPY DIAG: Malignant neoplasm of upper-inner quadrant of left breast in female, estrogen receptor positive (HCC)  PERTINENT HISTORY: Patient was diagnosed on 10/11/21 with left grade 2. It measures 0.2 cm and is located in the upper outer quadrant. It is ER/PR+, HER2- with a Ki67 of 5%. Pt underwent a L breast lumpectomy and SLNB (0/2) on 11/27/21  PRECAUTIONS: left UE Lymphedema risk, None  SUBJECTIVE: Pt returns for her 3 month L-Dex screen. Pt reports she had 2 breast biopsies this morning for enlarged lymph nodes.   PAIN:  Are you having pain? No  SOZO SCREENING: Patient was assessed today using the SOZO machine to determine the lymphedema index score. This was compared to her baseline score. It was determined that she is within the recommended range when compared to her baseline and no further action is needed at this time. She will continue SOZO screenings. These are done every 3 months for 2 years post operatively followed by every 6 months for 2 years, and then annually.   L-DEX FLOWSHEETS - 12/02/22 1400       L-DEX LYMPHEDEMA SCREENING   Measurement Type Unilateral    L-DEX MEASUREMENT  EXTREMITY Upper Extremity    POSITION  Standing    DOMINANT SIDE Right    At Risk Side Left    BASELINE SCORE (UNILATERAL) 0.5    L-DEX SCORE (UNILATERAL) 1.3    VALUE CHANGE (UNILAT) 0.8               [employer], PT 12/02/2022, 2:50 PM

## 2022-12-02 NOTE — Progress Notes (Signed)
PT tolerated two left breast biopsies well today with NAD noted. PT verbalized understanding of discharge instructions. PT ambulated back to the mammogram area this time and given ice packs. Samples taken to lab at this time by Lexi from ultrasound for processing.

## 2022-12-03 ENCOUNTER — Encounter (HOSPITAL_COMMUNITY): Payer: Self-pay

## 2022-12-03 LAB — SURGICAL PATHOLOGY

## 2022-12-05 ENCOUNTER — Other Ambulatory Visit (HOSPITAL_BASED_OUTPATIENT_CLINIC_OR_DEPARTMENT_OTHER): Payer: Self-pay

## 2022-12-26 ENCOUNTER — Other Ambulatory Visit: Payer: Self-pay | Admitting: Family Medicine

## 2022-12-26 ENCOUNTER — Other Ambulatory Visit (HOSPITAL_BASED_OUTPATIENT_CLINIC_OR_DEPARTMENT_OTHER): Payer: Self-pay

## 2022-12-26 MED ORDER — POTASSIUM CHLORIDE CRYS ER 20 MEQ PO TBCR
20.0000 meq | EXTENDED_RELEASE_TABLET | Freq: Every day | ORAL | 0 refills | Status: DC
  Filled 2022-12-26: qty 90, 90d supply, fill #0

## 2023-01-08 ENCOUNTER — Other Ambulatory Visit (HOSPITAL_BASED_OUTPATIENT_CLINIC_OR_DEPARTMENT_OTHER): Payer: Self-pay

## 2023-01-08 ENCOUNTER — Other Ambulatory Visit: Payer: Self-pay | Admitting: Family Medicine

## 2023-01-08 ENCOUNTER — Other Ambulatory Visit: Payer: Self-pay

## 2023-01-08 DIAGNOSIS — I10 Essential (primary) hypertension: Secondary | ICD-10-CM

## 2023-01-08 MED ORDER — AMLODIPINE BESYLATE 10 MG PO TABS
10.0000 mg | ORAL_TABLET | Freq: Every day | ORAL | 0 refills | Status: DC
Start: 2023-01-08 — End: 2023-07-14
  Filled 2023-01-08: qty 90, 90d supply, fill #0

## 2023-01-08 MED ORDER — VALSARTAN-HYDROCHLOROTHIAZIDE 320-12.5 MG PO TABS
1.0000 | ORAL_TABLET | Freq: Every day | ORAL | 0 refills | Status: DC
  Filled 2023-01-08: qty 90, 90d supply, fill #0

## 2023-01-16 ENCOUNTER — Inpatient Hospital Stay: Payer: Commercial Managed Care - PPO | Attending: Hematology & Oncology

## 2023-01-16 ENCOUNTER — Inpatient Hospital Stay: Payer: Commercial Managed Care - PPO | Admitting: Family

## 2023-01-16 ENCOUNTER — Encounter: Payer: Self-pay | Admitting: Family

## 2023-01-16 VITALS — BP 146/73 | HR 53 | Temp 98.2°F | Resp 17 | Wt 193.1 lb

## 2023-01-16 DIAGNOSIS — Z79811 Long term (current) use of aromatase inhibitors: Secondary | ICD-10-CM | POA: Diagnosis not present

## 2023-01-16 DIAGNOSIS — Z923 Personal history of irradiation: Secondary | ICD-10-CM | POA: Diagnosis not present

## 2023-01-16 DIAGNOSIS — C50212 Malignant neoplasm of upper-inner quadrant of left female breast: Secondary | ICD-10-CM | POA: Insufficient documentation

## 2023-01-16 DIAGNOSIS — R202 Paresthesia of skin: Secondary | ICD-10-CM | POA: Insufficient documentation

## 2023-01-16 DIAGNOSIS — Z91030 Bee allergy status: Secondary | ICD-10-CM | POA: Insufficient documentation

## 2023-01-16 DIAGNOSIS — Z79899 Other long term (current) drug therapy: Secondary | ICD-10-CM | POA: Diagnosis not present

## 2023-01-16 DIAGNOSIS — C50019 Malignant neoplasm of nipple and areola, unspecified female breast: Secondary | ICD-10-CM | POA: Diagnosis not present

## 2023-01-16 DIAGNOSIS — Z170 Estrogen receptor positive status [ER+]: Secondary | ICD-10-CM | POA: Insufficient documentation

## 2023-01-16 DIAGNOSIS — C50919 Malignant neoplasm of unspecified site of unspecified female breast: Secondary | ICD-10-CM

## 2023-01-16 DIAGNOSIS — R200 Anesthesia of skin: Secondary | ICD-10-CM | POA: Insufficient documentation

## 2023-01-16 DIAGNOSIS — Z880 Allergy status to penicillin: Secondary | ICD-10-CM | POA: Insufficient documentation

## 2023-01-16 LAB — CBC WITH DIFFERENTIAL (CANCER CENTER ONLY)
Abs Immature Granulocytes: 0.03 10*3/uL (ref 0.00–0.07)
Basophils Absolute: 0 10*3/uL (ref 0.0–0.1)
Basophils Relative: 1 %
Eosinophils Absolute: 0.1 10*3/uL (ref 0.0–0.5)
Eosinophils Relative: 2 %
HCT: 40.1 % (ref 36.0–46.0)
Hemoglobin: 13 g/dL (ref 12.0–15.0)
Immature Granulocytes: 1 %
Lymphocytes Relative: 35 %
Lymphs Abs: 1.3 10*3/uL (ref 0.7–4.0)
MCH: 28.1 pg (ref 26.0–34.0)
MCHC: 32.4 g/dL (ref 30.0–36.0)
MCV: 86.8 fL (ref 80.0–100.0)
Monocytes Absolute: 0.4 10*3/uL (ref 0.1–1.0)
Monocytes Relative: 11 %
Neutro Abs: 1.8 10*3/uL (ref 1.7–7.7)
Neutrophils Relative %: 50 %
Platelet Count: 267 10*3/uL (ref 150–400)
RBC: 4.62 MIL/uL (ref 3.87–5.11)
RDW: 13.6 % (ref 11.5–15.5)
WBC Count: 3.6 10*3/uL — ABNORMAL LOW (ref 4.0–10.5)
nRBC: 0 % (ref 0.0–0.2)

## 2023-01-16 LAB — CMP (CANCER CENTER ONLY)
ALT: 12 U/L (ref 0–44)
AST: 13 U/L — ABNORMAL LOW (ref 15–41)
Albumin: 4.8 g/dL (ref 3.5–5.0)
Alkaline Phosphatase: 88 U/L (ref 38–126)
Anion gap: 7 (ref 5–15)
BUN: 11 mg/dL (ref 8–23)
CO2: 31 mmol/L (ref 22–32)
Calcium: 10.9 mg/dL — ABNORMAL HIGH (ref 8.9–10.3)
Chloride: 108 mmol/L (ref 98–111)
Creatinine: 0.99 mg/dL (ref 0.44–1.00)
GFR, Estimated: >60 mL/min (ref >60)
Glucose, Bld: 105 mg/dL — ABNORMAL HIGH (ref 70–99)
Potassium: 4.9 mmol/L (ref 3.5–5.1)
Sodium: 146 mmol/L — ABNORMAL HIGH (ref 135–145)
Total Bilirubin: 0.5 mg/dL (ref 0.3–1.2)
Total Protein: 8.4 g/dL — ABNORMAL HIGH (ref 6.5–8.1)

## 2023-01-16 LAB — LACTATE DEHYDROGENASE: LDH: 162 U/L (ref 98–192)

## 2023-01-16 NOTE — Progress Notes (Signed)
Hematology and Oncology Follow Up Visit  Laura Patrick [id number] 1956-04-15 67 y.o. 01/16/2023   Principle Diagnosis:  Stage IA (T1aN0M0) infiltrating ductal carcinoma of the LEFT breast   --ER+/PR+/HER2- -- Oncotype =15   Past Therapy: Status post left lumpectomy on 11/27/2021 Radiation therapy --start in 01/21/2022 -- completed on 02/26/2022   Current Therapy:        Femara 2.5 mg PO daily -start after XRT -- started 03/10/2022   Interim History:  Laura Patrick is here today for follow-up. She is doing well and has no complaints at this time.  She had biopsy of left breast mass and lymph node in April and thankfully the pathology was negative for malignancy.  No adenopathy or lymphedema noted on exam.  She is tolerating her Femara nicely and taking as prescribed.  No fever, chills, n/v, cough, rash, dizziness, SOB, chest pain, palpitations, abdominal pini or changes in bowel or bladder habits.  She has carpal tunnel on her fingertips with numbness and tingling.  No swelling in her extremities.  No falls or syncope. Appetite is good but she admits that she needs to better hydrate with water throughout the day.   ECOG Performance Status: 1 - Symptomatic but completely ambulatory  Medications:  Allergies as of 01/16/2023       Reactions   Bee Venom Swelling, Other (See Comments)   Both elbows to the hands, swells, and red water like blisters appear.    Amoxicillin Rash        Medication List        Accurate as of January 16, 2023 10:15 AM. If you have any questions, ask your nurse or doctor.          amLODipine 10 MG tablet Commonly known as: NORVASC Take 1 tablet (10 mg total) by mouth daily.   letrozole 2.5 MG tablet Commonly known as: FEMARA Take 1 tablet (2.5 mg total) by mouth daily.   potassium chloride SA 20 MEQ tablet Commonly known as: KLOR-CON M Take 1 tablet (20 mEq total) by mouth daily.   valsartan-hydrochlorothiazide 320-12.5 MG  tablet Commonly known as: DIOVAN-HCT Take 1 tablet by mouth daily.   Vitamin D (Ergocalciferol) 1.25 MG (50000 UNIT) Caps capsule Commonly known as: DRISDOL Take 1 capsule (50,000 Units total) by mouth every 7 (seven) days.        Allergies:  Allergies  Allergen Reactions   Bee Venom Swelling and Other (See Comments)    Both elbows to the hands, swells, and red water like blisters appear.    Amoxicillin Rash    Past Medical History, Surgical history, Social history, and Family History were reviewed and updated.  Review of Systems: All other 10 point review of systems is negative.   Physical Exam:  weight is 193 lb 1.9 oz (87.6 kg). Her oral temperature is 98.2 F (36.8 C). Her blood pressure is 146/73 (abnormal) and her pulse is 53 (abnormal). Her respiration is 17 and oxygen saturation is 99%.   Wt Readings from Last 3 Encounters:  01/16/23 193 lb 1.9 oz (87.6 kg)  10/17/22 192 lb (87.1 kg)  08/25/22 194 lb 4 oz (88.1 kg)    Ocular: Sclerae unicteric, pupils equal, round and reactive to light Ear-nose-throat: Oropharynx clear, dentition fair Lymphatic: No cervical, supraclavicular or axillary adenopathy Lungs no rales or rhonchi, good excursion bilaterally Heart regular rate and rhythm, no murmur appreciated Abd soft, nontender, positive bowel sounds MSK no focal spinal tenderness, no joint edema Neuro: non-focal, well-oriented,  appropriate affect Breasts: Deferred this visit, no changes per patient  Lab Results  Component Value Date   WBC 3.6 (L) 01/16/2023   HGB 13.0 01/16/2023   HCT 40.1 01/16/2023   MCV 86.8 01/16/2023   PLT 267 01/16/2023   No results found for: "FERRITIN", "IRON", "TIBC", "UIBC", "IRONPCTSAT" Lab Results  Component Value Date   RBC 4.62 01/16/2023   No results found for: "KPAFRELGTCHN", "LAMBDASER", "KAPLAMBRATIO" No results found for: "IGGSERUM", "IGA", "IGMSERUM" No results found for: "TOTALPROTELP", "ALBUMINELP", "A1GS", "A2GS",  "BETS", "BETA2SER", "GAMS", "MSPIKE", "SPEI"   Chemistry      Component Value Date/Time   NA 140 10/17/2022 0819   NA 141 01/03/2019 0836   K 3.8 10/17/2022 0819   CL 103 10/17/2022 0819   CO2 28 10/17/2022 0819   BUN 12 10/17/2022 0819   BUN 15 01/03/2019 0836   CREATININE 0.89 10/17/2022 0819   CREATININE 0.91 10/06/2016 1529      Component Value Date/Time   CALCIUM 10.2 10/17/2022 0819   ALKPHOS 87 10/17/2022 0819   AST 13 (L) 10/17/2022 0819   ALT 14 10/17/2022 0819   BILITOT 0.4 10/17/2022 0819       Impression and Plan: Laura Patrick is a very pleasant 67 yo postmenopausal African American female with stage 1a infiltrating ductal carcinoma of the left breast with very good prognostic markers, Oncotype score 15.  She continues to do well and is asymptomatic at this time.  She will continue her same regimen with Femara.  Follow-up with MD in 3 months.   Eileen Stanford, NP 6/14/202410:15 AM

## 2023-02-03 ENCOUNTER — Other Ambulatory Visit (HOSPITAL_BASED_OUTPATIENT_CLINIC_OR_DEPARTMENT_OTHER): Payer: Self-pay

## 2023-02-09 ENCOUNTER — Other Ambulatory Visit: Payer: Self-pay | Admitting: Hematology & Oncology

## 2023-02-09 ENCOUNTER — Other Ambulatory Visit (HOSPITAL_BASED_OUTPATIENT_CLINIC_OR_DEPARTMENT_OTHER): Payer: Self-pay

## 2023-02-09 DIAGNOSIS — Z8639 Personal history of other endocrine, nutritional and metabolic disease: Secondary | ICD-10-CM

## 2023-02-09 MED ORDER — VITAMIN D (ERGOCALCIFEROL) 1.25 MG (50000 UNIT) PO CAPS
50000.0000 [IU] | ORAL_CAPSULE | ORAL | 1 refills | Status: DC
Start: 2023-02-09 — End: 2023-12-21
  Filled 2023-02-09: qty 12, 84d supply, fill #0
  Filled 2023-05-08: qty 12, 84d supply, fill #1

## 2023-02-25 DIAGNOSIS — M65332 Trigger finger, left middle finger: Secondary | ICD-10-CM | POA: Diagnosis not present

## 2023-02-25 DIAGNOSIS — G5603 Carpal tunnel syndrome, bilateral upper limbs: Secondary | ICD-10-CM | POA: Diagnosis not present

## 2023-02-25 DIAGNOSIS — M65312 Trigger thumb, left thumb: Secondary | ICD-10-CM | POA: Diagnosis not present

## 2023-03-11 ENCOUNTER — Other Ambulatory Visit: Payer: Self-pay | Admitting: Hematology & Oncology

## 2023-03-11 ENCOUNTER — Other Ambulatory Visit (HOSPITAL_BASED_OUTPATIENT_CLINIC_OR_DEPARTMENT_OTHER): Payer: Self-pay

## 2023-03-11 MED ORDER — LETROZOLE 2.5 MG PO TABS
2.5000 mg | ORAL_TABLET | Freq: Every day | ORAL | 12 refills | Status: DC
  Filled 2023-03-11: qty 90, 90d supply, fill #0
  Filled 2023-08-03: qty 90, 90d supply, fill #1
  Filled 2023-12-02: qty 90, 90d supply, fill #2
  Filled 2024-02-29: qty 90, 90d supply, fill #3

## 2023-03-23 ENCOUNTER — Ambulatory Visit: Payer: Commercial Managed Care - PPO | Attending: Surgery

## 2023-03-23 ENCOUNTER — Other Ambulatory Visit (HOSPITAL_BASED_OUTPATIENT_CLINIC_OR_DEPARTMENT_OTHER): Payer: Self-pay

## 2023-03-23 ENCOUNTER — Other Ambulatory Visit: Payer: Self-pay | Admitting: Family Medicine

## 2023-03-23 VITALS — Wt 193.5 lb

## 2023-03-23 DIAGNOSIS — Z483 Aftercare following surgery for neoplasm: Secondary | ICD-10-CM | POA: Insufficient documentation

## 2023-03-23 MED ORDER — POTASSIUM CHLORIDE CRYS ER 20 MEQ PO TBCR
20.0000 meq | EXTENDED_RELEASE_TABLET | Freq: Every day | ORAL | 0 refills | Status: DC
  Filled 2023-03-23: qty 90, 90d supply, fill #0

## 2023-03-23 NOTE — Therapy (Signed)
OUTPATIENT PHYSICAL THERAPY SOZO SCREENING NOTE   Patient Name: Laura Patrick MRN: [id number] DOB:Oct 24, 1955, 67 y.o., female Today's Date: 03/23/2023  PCP: Sheliah Hatch, MD REFERRING PROVIDER: Harriette Bouillon, MD   PT End of Session - 03/23/23 0847     Visit Number 2   # unchanged due to screen only   PT Start Time 0845    PT Stop Time 0856    PT Time Calculation (min) 11 min    Activity Tolerance Patient tolerated treatment well    Behavior During Therapy [facility] for tasks assessed/performed             Past Medical History:  Diagnosis Date   Anemia    Cancer (HCC) 10/2021   left breast IDC   History of radiation therapy    Left Breast- 01/28/22-02/27/22 Dr. Antony Blackbird   Hypertension    Ovarian cyst    Stage I breast cancer in female [facility]) 12/27/2021   Past Surgical History:  Procedure Laterality Date   BREAST BIOPSY Left 12/02/2022   [place] LT BREAST BX W LOC DEV 1ST LESION IMG BX SPEC US GUIDE 12/02/2022 AP-ULTRASOUND   BREAST BIOPSY Left 12/02/2022   [place] LT BREAST BX W LOC DEV EA ADD LESION IMG BX SPEC US GUIDE 12/02/2022 AP-ULTRASOUND   BREAST LUMPECTOMY WITH RADIOACTIVE SEED AND SENTINEL LYMPH NODE BIOPSY Left 11/27/2021   Procedure: LEFT BREAST LUMPECTOMY WITH RADIOACTIVE SEEDS X3 AND SENTINEL LYMPH NODE BIOPSY;  Surgeon: Harriette Bouillon, MD;  Location: [facility];  Service: General;  Laterality: Left;   COLONOSCOPY     OOPHORECTOMY Right 1988   OVARIAN CYST REMOVAL     TOE SURGERY     Patient Active Problem List   Diagnosis Date Noted   Stage I breast cancer in female (HCC) 12/27/2021   Retinal arteriolar sclerosis 11/13/2020   Vitamin D deficiency 12/28/2019   History of vitamin D deficiency 05/29/2017   Physical exam 08/10/2014   Encounter for Papanicolaou smear of cervix 08/10/2014   Morbid obesity (HCC) 10/14/2013   Pain in limb 05/23/2010   PES PLANUS 05/23/2010   PARESTHESIA 05/23/2010   HYPOPOTASSEMIA 08/30/2008    Hyperlipidemia 04/12/2007   SYNDROME, CARPAL TUNNEL 04/12/2007   Essential hypertension 04/12/2007    REFERRING DIAG: left breast cancer at risk for lymphedema  THERAPY DIAG:  Aftercare following surgery for neoplasm  PERTINENT HISTORY: Patient was diagnosed on 10/11/21 with left grade 2. It measures 0.2 cm and is located in the upper outer quadrant. It is ER/PR+, HER2- with a Ki67 of 5%. Pt underwent a L breast lumpectomy and SLNB (0/2) on 11/27/21   PRECAUTIONS: left UE Lymphedema risk, None  SUBJECTIVE: Pt returns for her 3 month L-Dex screen.   PAIN:  Are you having pain? No  SOZO SCREENING:  Patient was assessed today using the SOZO machine to determine the lymphedema index score. This was compared to her baseline score. It was determined that she is NOT within the recommended range when compared to her baseline and so she was fitted for a compression garment while in the clinic today. It is recommended she return in 1 month to be reassessed. If she continues to measure outside the recommended range, physical therapy treatment will be recommended at that time and a referral requested.   L-DEX FLOWSHEETS - 03/23/23 0800       L-DEX LYMPHEDEMA SCREENING   Measurement Type Unilateral    L-DEX MEASUREMENT EXTREMITY Upper Extremity    POSITION  Standing    DOMINANT SIDE Right    At Risk Side Left    BASELINE SCORE (UNILATERAL) 0.5    L-DEX SCORE (UNILATERAL) 7.9    VALUE CHANGE (UNILAT) 7.4              Hermenia Bers, PTA 03/23/2023, 10:36 AM    PLEASE KEEP YOUR COMPRESSION GARMENT ON DURING THE DAY TO GET THE BEST SWELLING REDUCTION. HERE ARE SOME ADDITIONAL TIPS: Do not sleep in your garment. If you have pain or notice swelling in your hand or at the top of your shoulder, call your therapist. This may be a sign that you need a different garment. 3.  Take good care of your garment so it lasts longer: Follow washing instructions on your garment label or  box. Wash periodically using a mild detergent in warm water.  Do not use fabric softener or bleach.  Place garment in a mesh lingerie bag and use the gentle cycle of the washing machine or hand wash. Tumble dry low or lay flat to dry. TAKE CARE OF YOUR SKIN Apply a low pH moisturizing lotion to your skin daily Avoid scratching your skin Treat skin irritations quickly  Know the 5 warning signs of infection: redness, pain, warmth to touch, fever and increased swelling.  Call your physician immediately if you notice any of these signs of a possible infection.

## 2023-03-23 NOTE — Patient Instructions (Signed)
PLEASE KEEP YOUR COMPRESSION GARMENT ON DURING THE DAY TO GET THE BEST SWELLING REDUCTION. HERE ARE SOME ADDITIONAL TIPS: Do not sleep in your garment. If you have pain or notice swelling in your hand or at the top of your shoulder, call your therapist. This may be a sign that you need a different garment. 3.  Take good care of your garment so it lasts longer: Follow washing instructions on your garment label or box. Wash periodically using a mild detergent in warm water.  Do not use fabric softener or bleach.  Place garment in a mesh lingerie bag and use the gentle cycle of the washing machine or hand wash. Tumble dry low or lay flat to dry. TAKE CARE OF YOUR SKIN Apply a low pH moisturizing lotion to your skin daily Avoid scratching your skin Treat skin irritations quickly  Know the 5 warning signs of infection: redness, pain, warmth to touch, fever and increased swelling.  Call your physician immediately if you notice any of these signs of a possible infection.  

## 2023-04-08 ENCOUNTER — Other Ambulatory Visit (HOSPITAL_BASED_OUTPATIENT_CLINIC_OR_DEPARTMENT_OTHER): Payer: Self-pay

## 2023-04-08 ENCOUNTER — Other Ambulatory Visit: Payer: Self-pay | Admitting: Family Medicine

## 2023-04-08 MED ORDER — VALSARTAN-HYDROCHLOROTHIAZIDE 320-12.5 MG PO TABS
1.0000 | ORAL_TABLET | Freq: Every day | ORAL | 0 refills | Status: DC
  Filled 2023-04-08: qty 90, 90d supply, fill #0

## 2023-04-17 ENCOUNTER — Inpatient Hospital Stay: Payer: Commercial Managed Care - PPO | Attending: Hematology & Oncology

## 2023-04-17 ENCOUNTER — Encounter: Payer: Self-pay | Admitting: Hematology & Oncology

## 2023-04-17 ENCOUNTER — Inpatient Hospital Stay: Payer: Commercial Managed Care - PPO | Admitting: Hematology & Oncology

## 2023-04-17 VITALS — BP 133/57 | HR 65 | Temp 98.4°F | Resp 18 | Ht 60.0 in | Wt 195.0 lb

## 2023-04-17 DIAGNOSIS — C50919 Malignant neoplasm of unspecified site of unspecified female breast: Secondary | ICD-10-CM

## 2023-04-17 DIAGNOSIS — Z91030 Bee allergy status: Secondary | ICD-10-CM | POA: Diagnosis not present

## 2023-04-17 DIAGNOSIS — Z880 Allergy status to penicillin: Secondary | ICD-10-CM | POA: Insufficient documentation

## 2023-04-17 DIAGNOSIS — Z170 Estrogen receptor positive status [ER+]: Secondary | ICD-10-CM | POA: Diagnosis not present

## 2023-04-17 DIAGNOSIS — Z923 Personal history of irradiation: Secondary | ICD-10-CM | POA: Insufficient documentation

## 2023-04-17 DIAGNOSIS — C50212 Malignant neoplasm of upper-inner quadrant of left female breast: Secondary | ICD-10-CM | POA: Insufficient documentation

## 2023-04-17 DIAGNOSIS — Z79811 Long term (current) use of aromatase inhibitors: Secondary | ICD-10-CM | POA: Diagnosis not present

## 2023-04-17 DIAGNOSIS — C50019 Malignant neoplasm of nipple and areola, unspecified female breast: Secondary | ICD-10-CM

## 2023-04-17 LAB — CMP (CANCER CENTER ONLY)
ALT: 15 U/L (ref 0–44)
AST: 15 U/L (ref 15–41)
Albumin: 4.7 g/dL (ref 3.5–5.0)
Alkaline Phosphatase: 91 U/L (ref 38–126)
Anion gap: 9 (ref 5–15)
BUN: 16 mg/dL (ref 8–23)
CO2: 30 mmol/L (ref 22–32)
Calcium: 10.6 mg/dL — ABNORMAL HIGH (ref 8.9–10.3)
Chloride: 106 mmol/L (ref 98–111)
Creatinine: 1.04 mg/dL — ABNORMAL HIGH (ref 0.44–1.00)
GFR, Estimated: 58 mL/min — ABNORMAL LOW (ref >60)
Glucose, Bld: 110 mg/dL — ABNORMAL HIGH (ref 70–99)
Potassium: 5.1 mmol/L (ref 3.5–5.1)
Sodium: 145 mmol/L (ref 135–145)
Total Bilirubin: 0.5 mg/dL (ref 0.3–1.2)
Total Protein: 8.3 g/dL — ABNORMAL HIGH (ref 6.5–8.1)

## 2023-04-17 LAB — CBC WITH DIFFERENTIAL (CANCER CENTER ONLY)
Abs Immature Granulocytes: 0.03 10*3/uL (ref 0.00–0.07)
Basophils Absolute: 0 10*3/uL (ref 0.0–0.1)
Basophils Relative: 1 %
Eosinophils Absolute: 0.1 10*3/uL (ref 0.0–0.5)
Eosinophils Relative: 2 %
HCT: 41.9 % (ref 36.0–46.0)
Hemoglobin: 13.6 g/dL (ref 12.0–15.0)
Immature Granulocytes: 1 %
Lymphocytes Relative: 35 %
Lymphs Abs: 1.4 10*3/uL (ref 0.7–4.0)
MCH: 28.1 pg (ref 26.0–34.0)
MCHC: 32.5 g/dL (ref 30.0–36.0)
MCV: 86.6 fL (ref 80.0–100.0)
Monocytes Absolute: 0.5 10*3/uL (ref 0.1–1.0)
Monocytes Relative: 12 %
Neutro Abs: 2.1 10*3/uL (ref 1.7–7.7)
Neutrophils Relative %: 49 %
Platelet Count: 282 10*3/uL (ref 150–400)
RBC: 4.84 MIL/uL (ref 3.87–5.11)
RDW: 13.6 % (ref 11.5–15.5)
WBC Count: 4.1 10*3/uL (ref 4.0–10.5)
nRBC: 0 % (ref 0.0–0.2)

## 2023-04-17 LAB — LACTATE DEHYDROGENASE: LDH: 200 U/L — ABNORMAL HIGH (ref 98–192)

## 2023-04-17 NOTE — Progress Notes (Signed)
Hematology and Oncology Follow Up Visit  Laura Patrick [id number] Dec 02, 1955 67 y.o. 04/17/2023   Principle Diagnosis:  Stage IA ([id number]) infiltrating ductal carcinoma of the LEFT breast   --ER+/PR+/HER2- -- Oncotype =15   Past Therapy: Status post left lumpectomy on 11/27/2021 Radiation therapy --start in 01/21/2022 -- completed on 02/26/2022   Current Therapy:        Femara 2.5 mg PO daily -start after XRT -- started 03/10/2022   Interim History:  [name] is here today for follow-u   We last saw her 3 months ago.  She is doing pretty well.  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  She wears a compression sleeve on the left arm.  She wants to get a new one.  She is worried about her weight.  Her weight is essentially holding stable.  She is trying to walk more.  Hopefully, she will be able to continue to exercise.  She is on Femara.  She is doing well with the Femara.  She is not having hot flashes.  She has had no cough.  There is been no nausea or vomiting.  She has had no change in bowel or bladder habits.  There has been no rashes.  She has had no bleeding.  Overall, I would say that her performance status is probably ECOG 1.     Medications:  Allergies as of 04/17/2023       Reactions   Bee Venom Other (See Comments), Swelling   Both elbows to the hands, swells, and red water like blisters appear. Reaction: Sweating (intolerance)   Amoxicillin Rash        Medication List        Accurate as of April 17, 2023 10:12 AM. If you have any questions, ask your nurse or doctor.          amLODipine 10 MG tablet Commonly known as: NORVASC Take 1 tablet (10 mg total) by mouth daily.   letrozole 2.5 MG tablet Commonly known as: FEMARA Take 1 tablet (2.5 mg total) by mouth daily.   potassium chloride SA 20 MEQ tablet Commonly known as: KLOR-CON M Take 1 tablet (20 mEq total) by mouth daily.    valsartan-hydrochlorothiazide 320-12.5 MG tablet Commonly known as: DIOVAN-HCT Take 1 tablet by mouth daily.   Vitamin D (Ergocalciferol) 1.25 MG (50000 UNIT) Caps capsule Commonly known as: DRISDOL Take 1 capsule (50,000 Units total) by mouth every 7 (seven) days.        Allergies:  Allergies  Allergen Reactions   Bee Venom Other (See Comments) and Swelling    Both elbows to the hands, swells, and red water like blisters appear.  Reaction: Sweating (intolerance)   Amoxicillin Rash    Past Medical History, Surgical history, Social history, and Family History were reviewed and updated.  Review of Systems:Review of Systems  Constitutional: Negative.   HENT: Negative.    Eyes: Negative.   Respiratory: Negative.    Cardiovascular: Negative.   Gastrointestinal: Negative.   Genitourinary: Negative.   Musculoskeletal: Negative.   Skin: Negative.   Neurological: Negative.   Endo/Heme/Allergies: Negative.   Psychiatric/Behavioral: Negative.       Physical Exam:  height is 5' (1.524 m) and weight is 195 lb (88.5 kg). Her oral temperature is 98.4 F (36.9 C). Her blood pressure is 133/57 (abnormal) and her pulse is 65. Her respiration is 18 and oxygen saturation is 98%.   Wt Readings from Last  3 Encounters:  04/17/23 195 lb (88.5 kg)  03/23/23 193 lb 8 oz (87.8 kg)  01/16/23 193 lb 1.9 oz (87.6 kg)   Physical Exam Vitals reviewed.  Constitutional:      Comments: Breast exam shows right breast no masses, edema or erythema.  There is no right axillary adenopathy.  Left breast shows well-healed lumpectomy at the 12 o'clock position.  She has limited firmness at the lumpectomy site.  She has some radiation changes.  There is no nipple discharge.  There is no left axillary adenopathy.  HENT:     Head: Normocephalic and atraumatic.  Eyes:     Pupils: Pupils are equal, round, and reactive to light.  Cardiovascular:     Rate and Rhythm: Normal rate and regular rhythm.      Heart sounds: Normal heart sounds.  Pulmonary:     Effort: Pulmonary effort is normal.     Breath sounds: Normal breath sounds.  Abdominal:     General: Bowel sounds are normal.     Palpations: Abdomen is soft.  Musculoskeletal:        General: No tenderness or deformity. Normal range of motion.     Cervical back: Normal range of motion.     Comments: I really cannot detect any lymphedema in the left arm.  She has a compression sleeve on.  Lymphadenopathy:     Cervical: No cervical adenopathy.  Skin:    General: Skin is warm and dry.     Findings: No erythema or rash.  Neurological:     Mental Status: She is alert and oriented to person, place, and time.  Psychiatric:        Behavior: Behavior normal.        Thought Content: Thought content normal.        Judgment: Judgment normal.       Lab Results  Component Value Date   WBC 4.1 04/17/2023   HGB 13.6 04/17/2023   HCT 41.9 04/17/2023   MCV 86.6 04/17/2023   PLT 282 04/17/2023   No results found for: "FERRITIN", "IRON", "TIBC", "UIBC", "IRONPCTSAT" Lab Results  Component Value Date   RBC 4.84 04/17/2023   No results found for: "KPAFRELGTCHN", "LAMBDASER", "KAPLAMBRATIO" No results found for: "IGGSERUM", "IGA", "IGMSERUM" No results found for: "TOTALPROTELP", "ALBUMINELP", "A1GS", "A2GS", "BETS", "BETA2SER", "GAMS", "MSPIKE", "SPEI"   Chemistry      Component Value Date/Time   NA 145 04/17/2023 0930   NA 141 01/03/2019 0836   K 5.1 04/17/2023 0930   CL 106 04/17/2023 0930   CO2 30 04/17/2023 0930   BUN 16 04/17/2023 0930   BUN 15 01/03/2019 0836   CREATININE 1.04 (H) 04/17/2023 0930   CREATININE 0.91 10/06/2016 1529      Component Value Date/Time   CALCIUM 10.6 (H) 04/17/2023 0930   ALKPHOS 91 04/17/2023 0930   AST 15 04/17/2023 0930   ALT 15 04/17/2023 0930   BILITOT 0.5 04/17/2023 0930       Impression and Plan: [name] is a very pleasant 67 yo postmenopausal African American female with stage  1A infiltrating ductal carcinoma of the left breast with very good prognostic markers, Oncotype score 15.   She underwent a lumpectomy followed by radiotherapy.  She currently is on Femara.  So far, everything is doing great.  Her labs look good.  I think we can probably move her appointments out now to 4 months.  I think this would be very reasonable.  She works in  our building so if there is any problem she will let us know.   Josph Macho, MD 9/13/202410:12 AM

## 2023-04-24 IMAGING — US US BREAST*L* LIMITED INC AXILLA
1 series · 13 of 18 positions shown · non-contrast
Comparison: Previous exams.

CLINICAL DATA: Screening recall for possible left breast
distortion.

EXAM:
DIGITAL DIAGNOSTIC UNILATERAL LEFT MAMMOGRAM WITH TOMOSYNTHESIS AND
CAD; ULTRASOUND LEFT BREAST LIMITED
TECHNIQUE: Left digital diagnostic mammography and breast tomosynthesis was
performed. The images were evaluated with computer-aided detection.;
Targeted ultrasound examination of the left breast was performed.

[Series 1: us breast*left* limited inc axilla · 0.07mm/px · 13 of 18 slices shown]
[im 1/18]
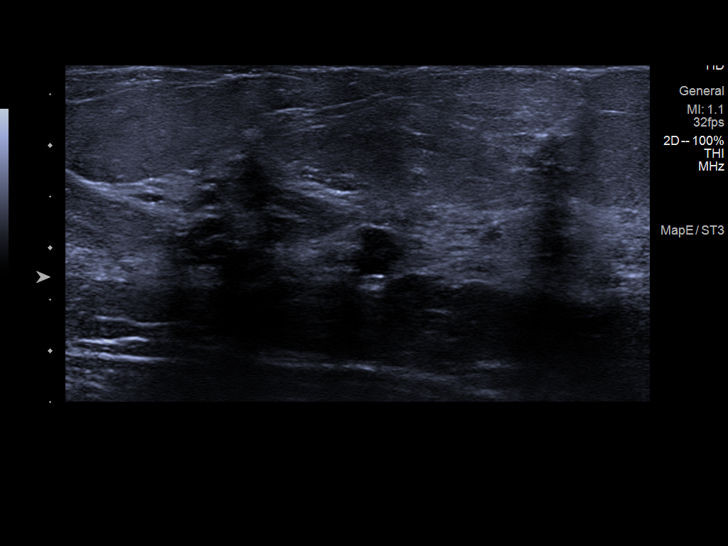
[im 3/18]
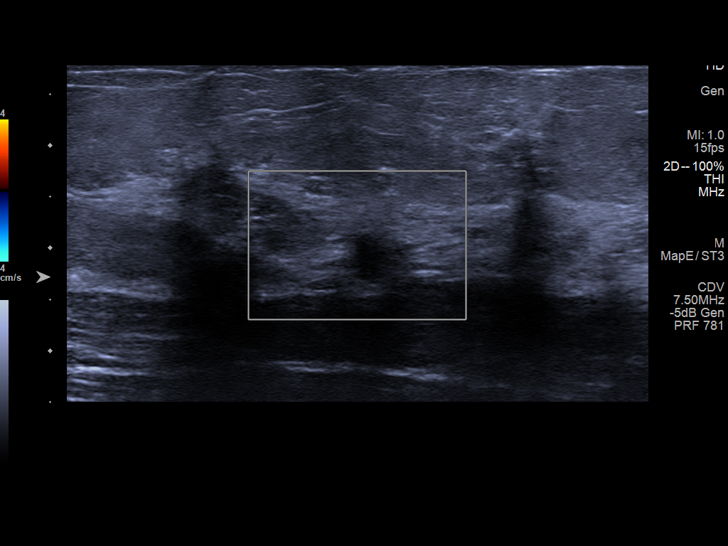
[im 4/18]
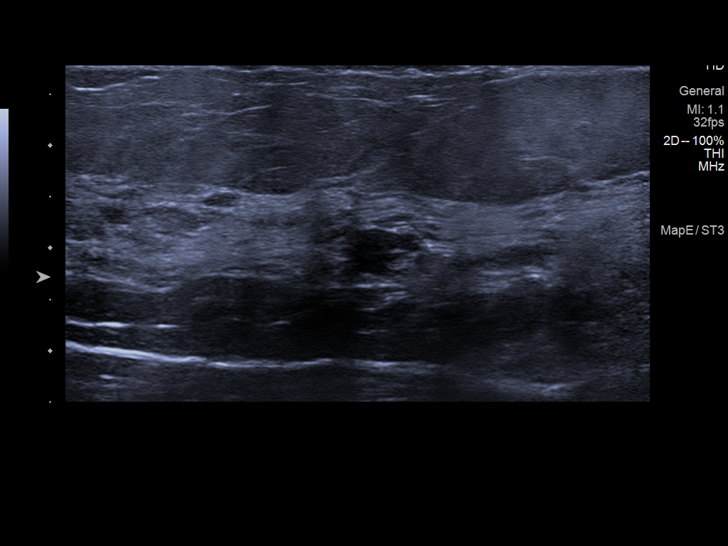
[im 5/18]
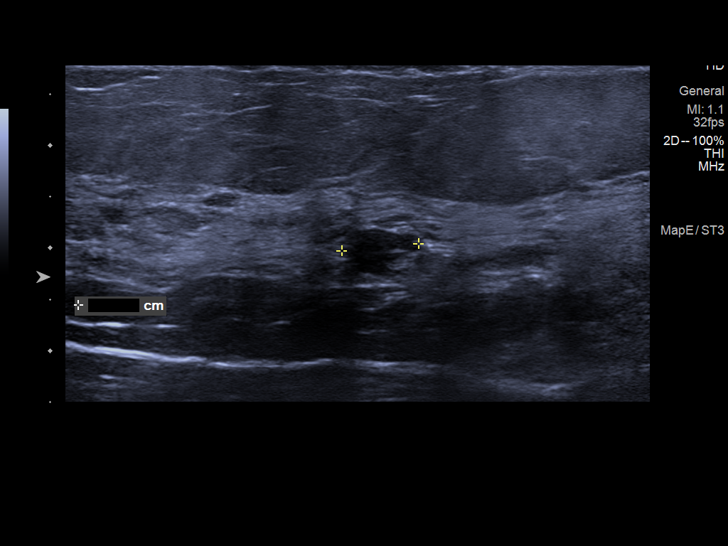
[im 7/18]
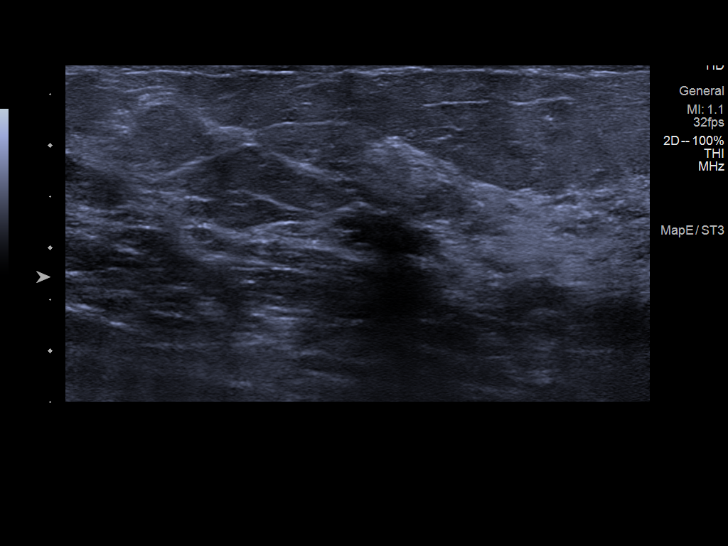
[im 8/18]
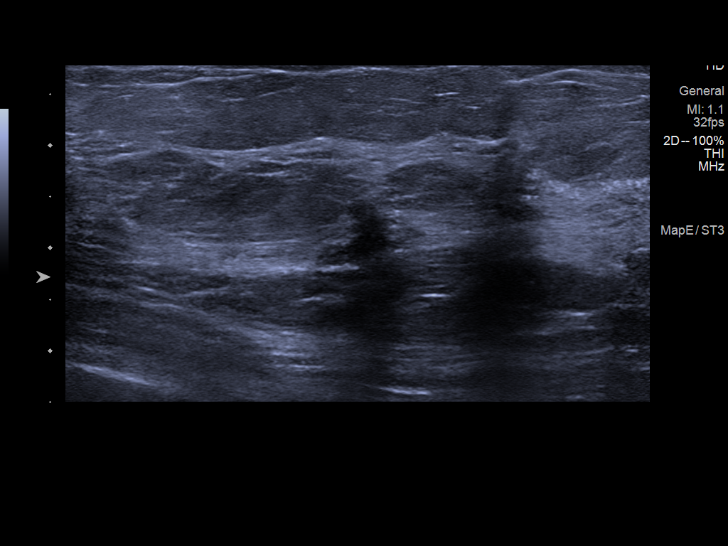
[im 10/18]
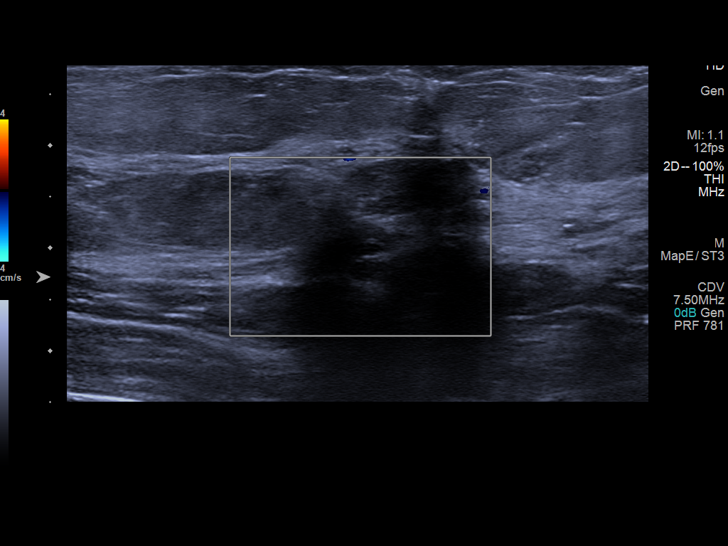
[im 11/18]
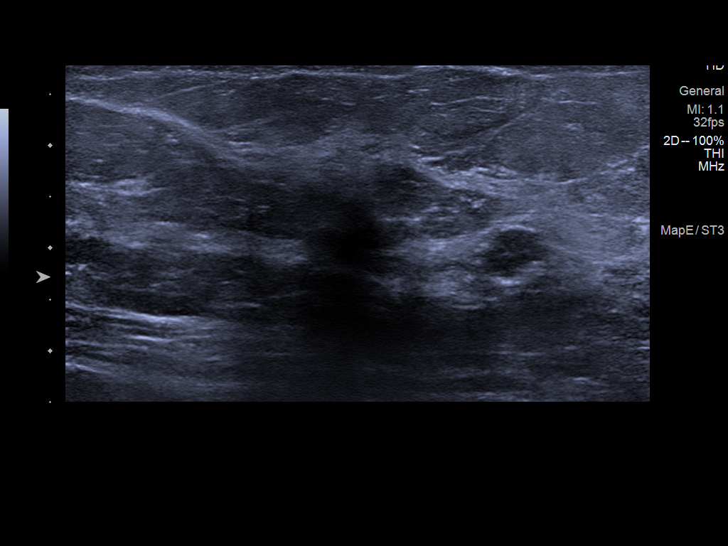
[im 12/18]
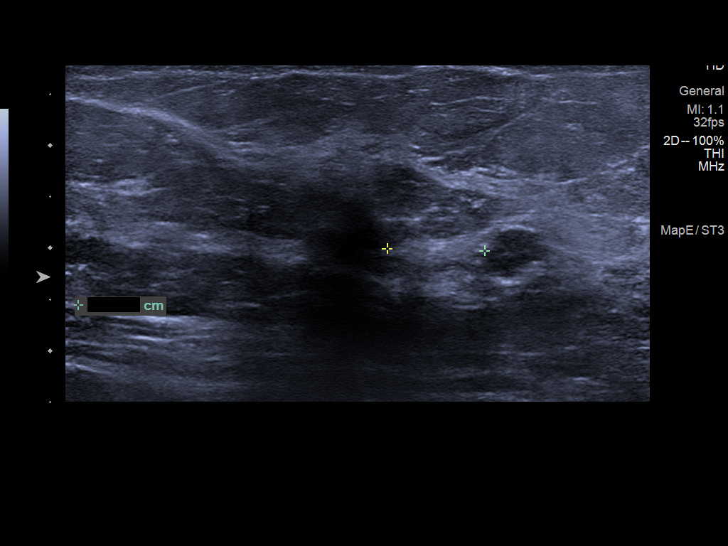
[im 14/18]
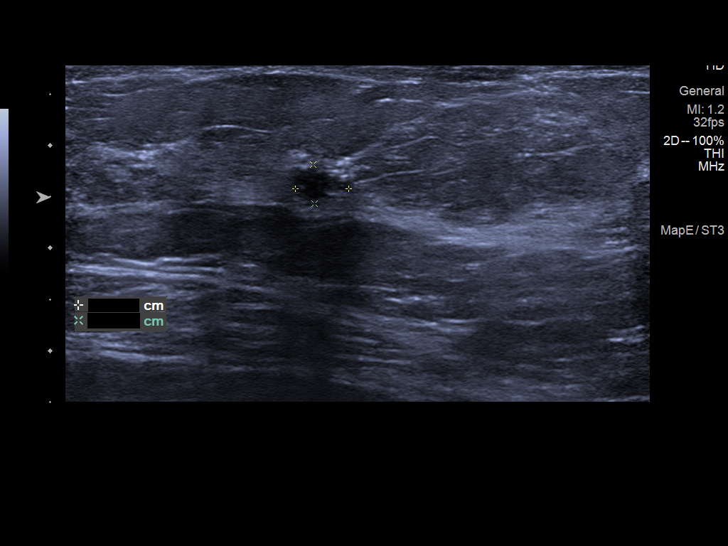
[im 15/18]
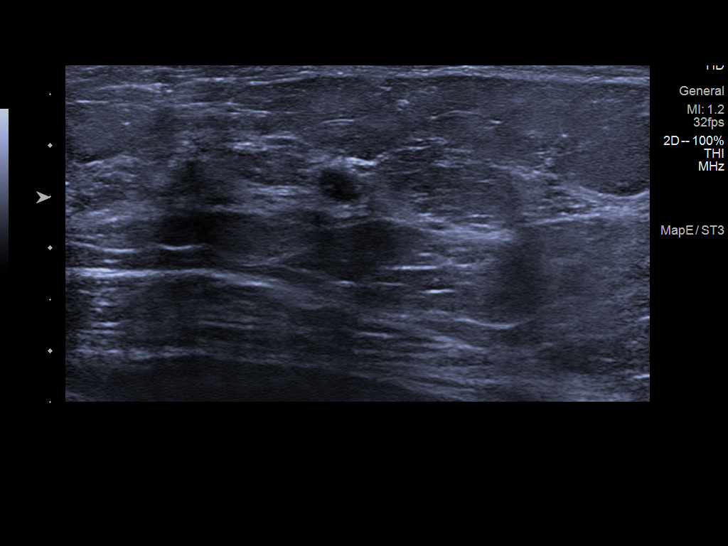
[im 16/18]
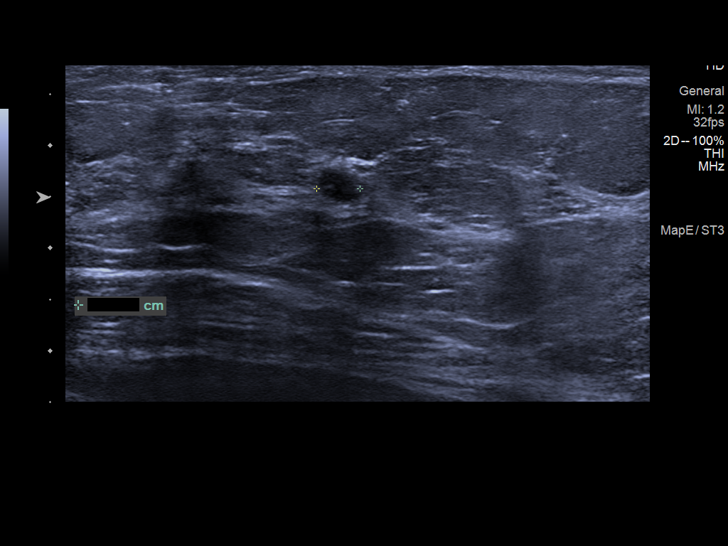
[im 18/18]
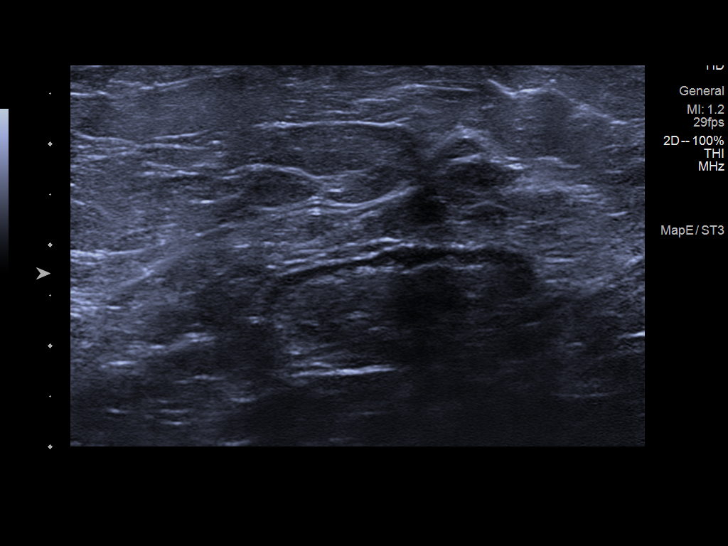

[13 of 18 positions shown; findings below may reference images not displayed]

ACR Breast Density Category c: The breast tissue is heterogeneously
dense, which may obscure small masses.
FINDINGS: Additional tomograms were performed of the left breast. There is
persistent subtle distortion present in the upper inner left breast
best visualized on the CC tomograms.

Targeted ultrasound of the left breast was performed. There is a
hypoechoic mass in the left breast at 12 o'clock 4 cm from nipple
with mild margin irregularity measuring 0.6 x 0.5 x 0.8 cm. There is
an irregular hypoechoic mass in the left breast at 11 o'clock 6 cm
from nipple measuring 1 x 0.4 x 0.8 cm. This is felt to correspond
with the distortion seen in the left breast at mammography. These 2
masses are located approximately 1 cm from each other. In addition
there is a small irregular mass in the left breast at 12 o'clock 6
cm from nipple measuring 0.5 x 0.4 x 0.4 cm.

No lymphadenopathy seen in the left axilla.
IMPRESSION: 1. Suspicious 1 cm mass in the left breast at 11 o'clock 6 cm from
nipple felt to correspond with the distortion seen on mammography.

2. Indeterminate 0.8 cm mass in the left breast at 12 o'clock 4 cm
from nipple.

3. Indeterminate 0.5 cm mass in the left breast at 12 o'clock 6 cm
from nipple.

RECOMMENDATION:
Recommend ultrasound-guided core biopsies of the 3 masses in the
left breast.

I have discussed the findings and recommendations with the patient.
If applicable, a reminder letter will be sent to the patient
regarding the next appointment.

BI-RADS CATEGORY  4: Suspicious.

## 2023-04-24 IMAGING — MG MM DIGITAL DIAGNOSTIC UNILAT*L* W/ TOMO W/ CAD
6 series · 6 of 18 positions shown · non-contrast
Comparison: Previous exams.

CLINICAL DATA: Screening recall for possible left breast
distortion.

EXAM:
DIGITAL DIAGNOSTIC UNILATERAL LEFT MAMMOGRAM WITH TOMOSYNTHESIS AND
CAD; ULTRASOUND LEFT BREAST LIMITED
TECHNIQUE: Left digital diagnostic mammography and breast tomosynthesis was
performed. The images were evaluated with computer-aided detection.;
Targeted ultrasound examination of the left breast was performed.

[L CC synth-2D]
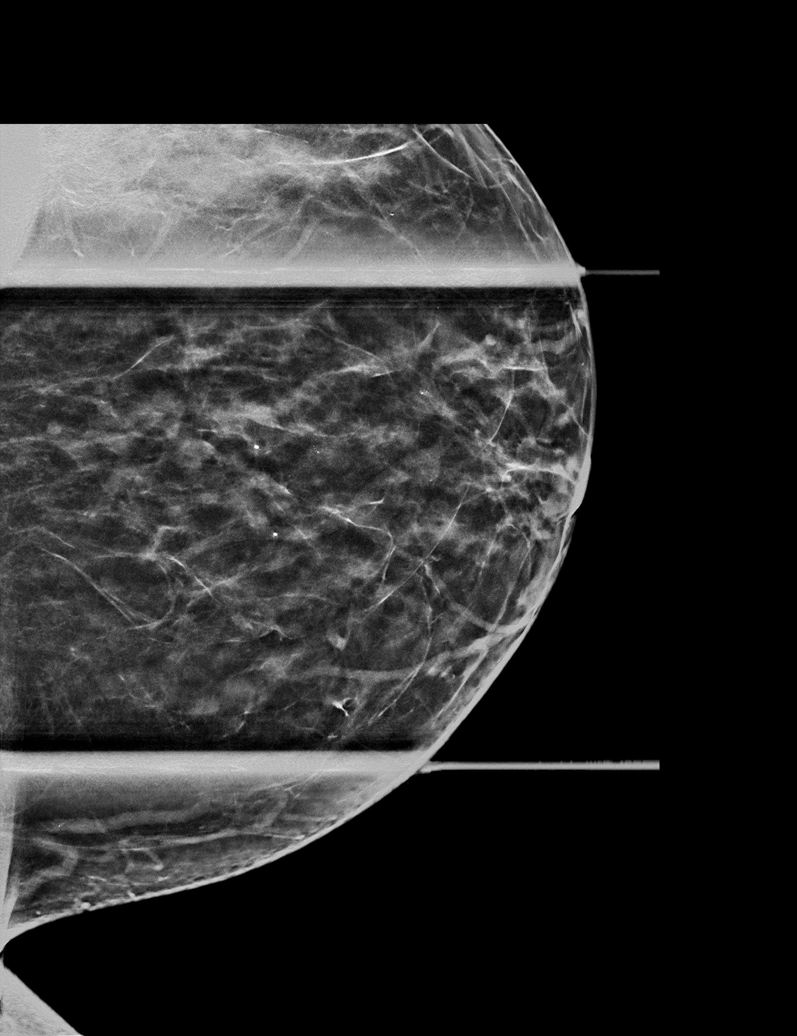

[L ML synth-2D]
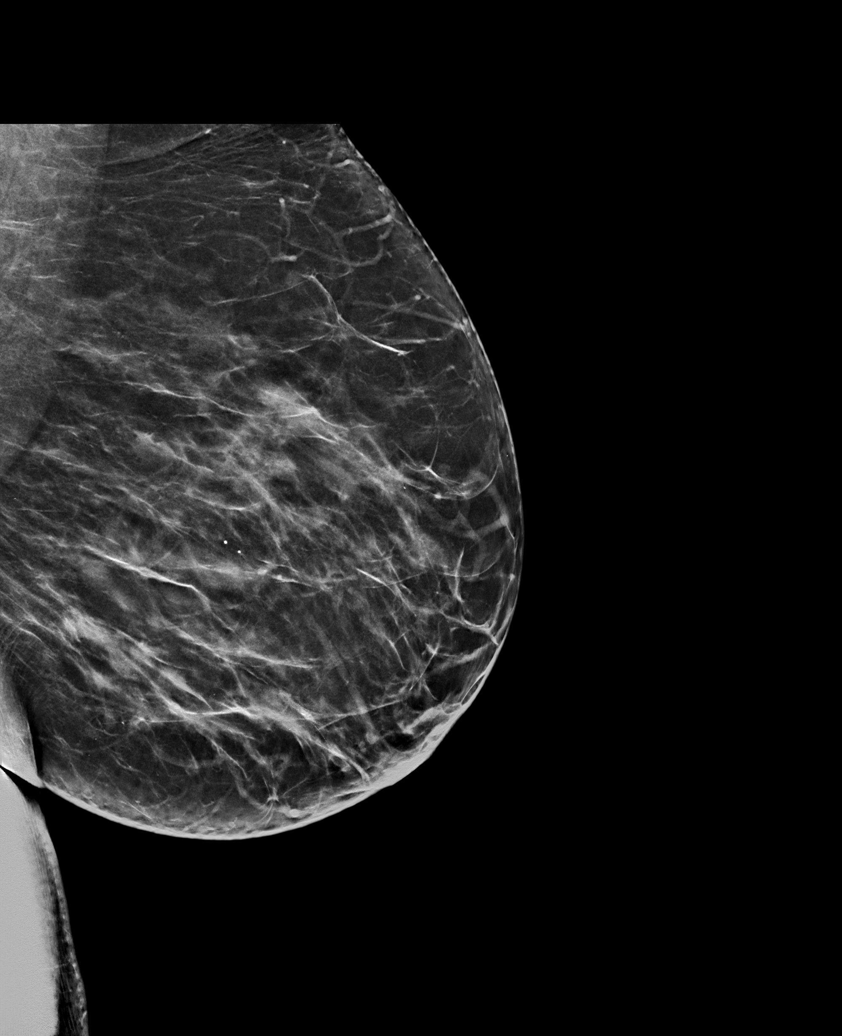

[L MLO synth-2D]
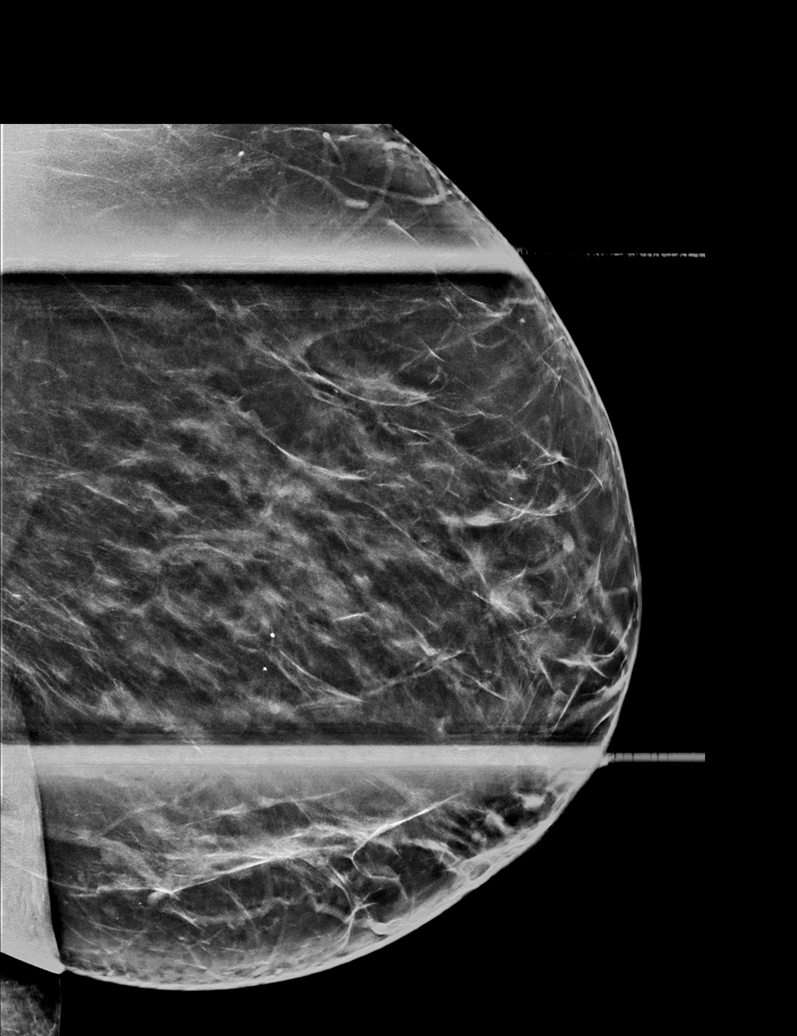

[L ML tomo · tomo slice 36/71.0]
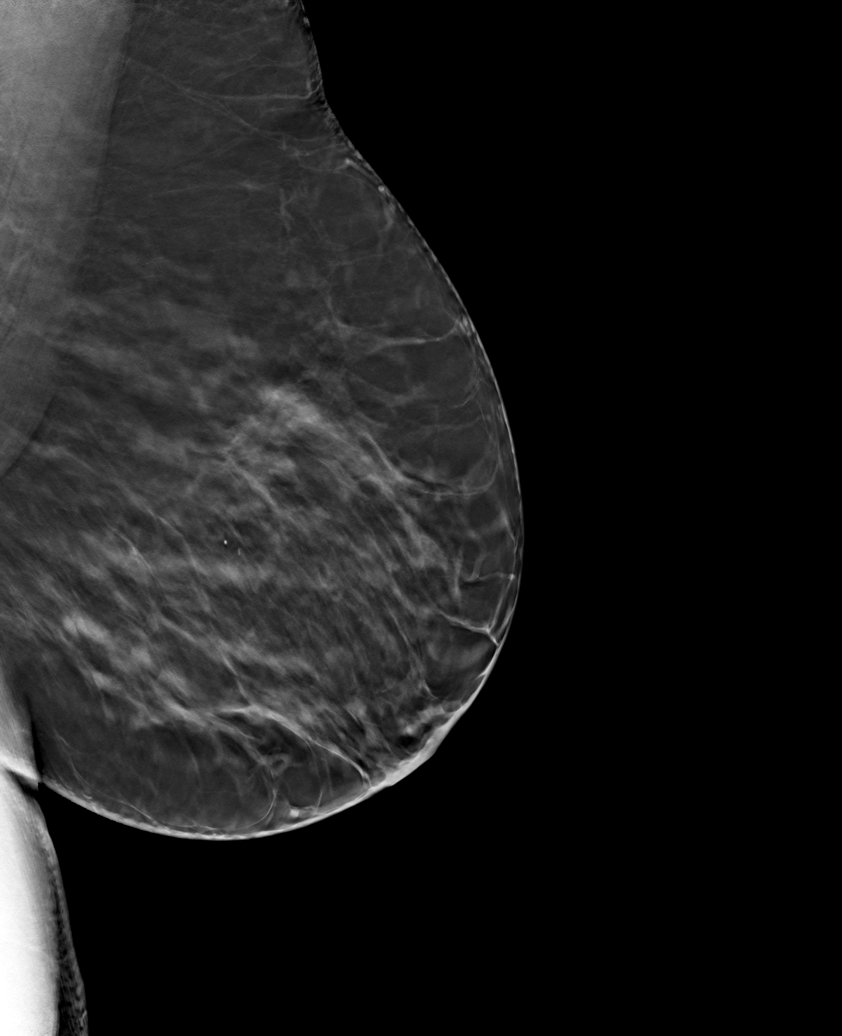

[L MLO tomo · tomo slice 33/66.0]
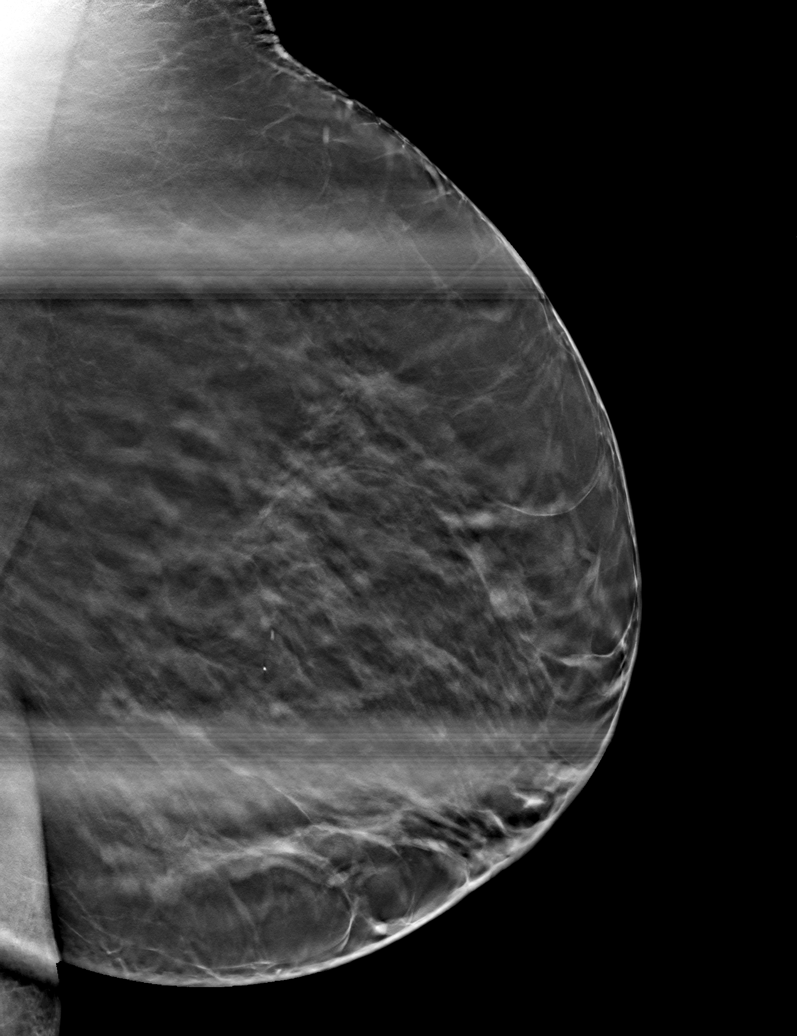

[L CC tomo · tomo slice 29/57.0]
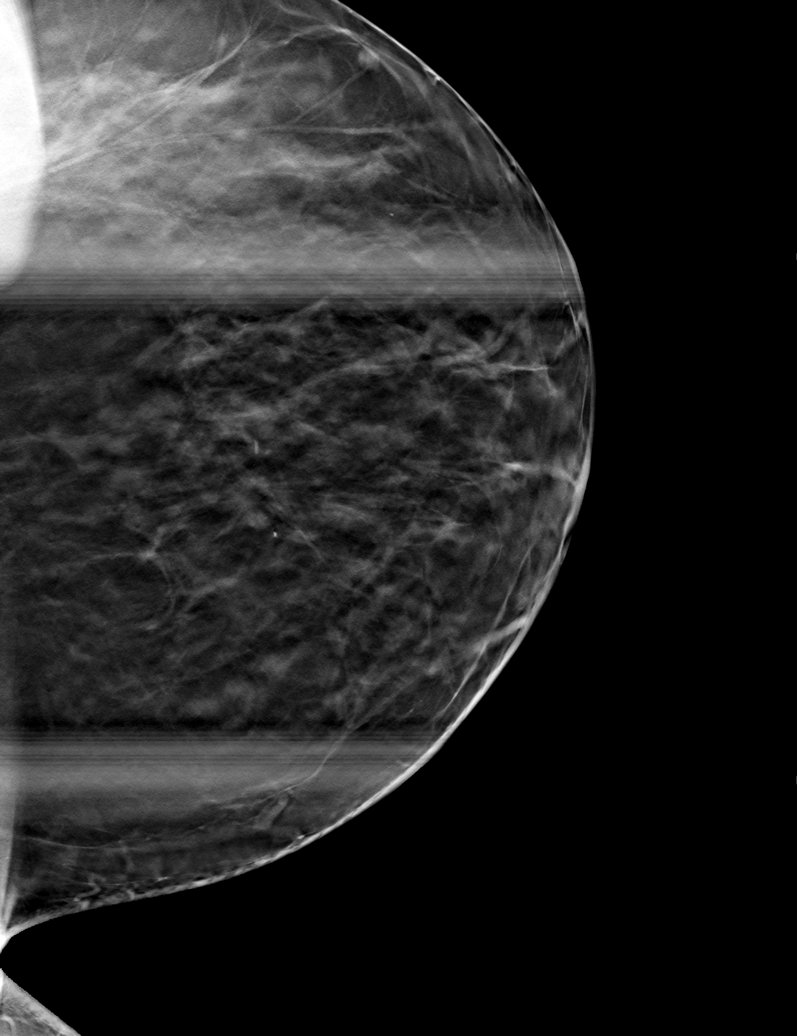

[6 of 18 positions shown; findings below may reference images not displayed]

ACR Breast Density Category c: The breast tissue is heterogeneously
dense, which may obscure small masses.
FINDINGS: Additional tomograms were performed of the left breast. There is
persistent subtle distortion present in the upper inner left breast
best visualized on the CC tomograms.

Targeted ultrasound of the left breast was performed. There is a
hypoechoic mass in the left breast at 12 o'clock 4 cm from nipple
with mild margin irregularity measuring 0.6 x 0.5 x 0.8 cm. There is
an irregular hypoechoic mass in the left breast at 11 o'clock 6 cm
from nipple measuring 1 x 0.4 x 0.8 cm. This is felt to correspond
with the distortion seen in the left breast at mammography. These 2
masses are located approximately 1 cm from each other. In addition
there is a small irregular mass in the left breast at 12 o'clock 6
cm from nipple measuring 0.5 x 0.4 x 0.4 cm.

No lymphadenopathy seen in the left axilla.
IMPRESSION: 1. Suspicious 1 cm mass in the left breast at 11 o'clock 6 cm from
nipple felt to correspond with the distortion seen on mammography.

2. Indeterminate 0.8 cm mass in the left breast at 12 o'clock 4 cm
from nipple.

3. Indeterminate 0.5 cm mass in the left breast at 12 o'clock 6 cm
from nipple.

RECOMMENDATION:
Recommend ultrasound-guided core biopsies of the 3 masses in the
left breast.

I have discussed the findings and recommendations with the patient.
If applicable, a reminder letter will be sent to the patient
regarding the next appointment.

BI-RADS CATEGORY  4: Suspicious.

## 2023-04-27 ENCOUNTER — Ambulatory Visit: Payer: Commercial Managed Care - PPO | Attending: Surgery

## 2023-04-27 VITALS — Wt 196.1 lb

## 2023-04-27 DIAGNOSIS — Z483 Aftercare following surgery for neoplasm: Secondary | ICD-10-CM | POA: Insufficient documentation

## 2023-04-27 NOTE — Therapy (Signed)
OUTPATIENT PHYSICAL THERAPY SOZO SCREENING NOTE   Patient Name: Laura Patrick MRN: [id number] DOB:May 17, 1956, 67 y.o., female Today's Date: 04/27/2023  PCP: Sheliah Hatch, MD REFERRING PROVIDER: Harriette Bouillon, MD   PT End of Session - 04/27/23 [phone]     Visit Number 2   # unchanged due to screen only   PT Start Time 0812    PT Stop Time 0820    PT Time Calculation (min) 8 min    Activity Tolerance Patient tolerated treatment well    Behavior During Therapy [facility] for tasks assessed/performed             Past Medical History:  Diagnosis Date   Anemia    Cancer (HCC) 10/2021   left breast IDC   History of radiation therapy    Left Breast- 01/28/22-02/27/22 Dr. Antony Blackbird   Hypertension    Ovarian cyst    Stage I breast cancer in female [facility]) 12/27/2021   Past Surgical History:  Procedure Laterality Date   BREAST BIOPSY Left 12/02/2022   [place] LT BREAST BX W LOC DEV 1ST LESION IMG BX SPEC US GUIDE 12/02/2022 AP-ULTRASOUND   BREAST BIOPSY Left 12/02/2022   [place] LT BREAST BX W LOC DEV EA ADD LESION IMG BX SPEC US GUIDE 12/02/2022 AP-ULTRASOUND   BREAST LUMPECTOMY WITH RADIOACTIVE SEED AND SENTINEL LYMPH NODE BIOPSY Left 11/27/2021   Procedure: LEFT BREAST LUMPECTOMY WITH RADIOACTIVE SEEDS X3 AND SENTINEL LYMPH NODE BIOPSY;  Surgeon: Harriette Bouillon, MD;  Location: [facility];  Service: General;  Laterality: Left;   COLONOSCOPY     OOPHORECTOMY Right 1988   OVARIAN CYST REMOVAL     TOE SURGERY     Patient Active Problem List   Diagnosis Date Noted   Stage I breast cancer in female (HCC) 12/27/2021   Retinal arteriolar sclerosis 11/13/2020   Vitamin D deficiency 12/28/2019   History of vitamin D deficiency 05/29/2017   Physical exam 08/10/2014   Encounter for Papanicolaou smear of cervix 08/10/2014   Morbid obesity (HCC) 10/14/2013   Pain in limb 05/23/2010   PES PLANUS 05/23/2010   PARESTHESIA 05/23/2010   HYPOPOTASSEMIA 08/30/2008    Hyperlipidemia 04/12/2007   SYNDROME, CARPAL TUNNEL 04/12/2007   Essential hypertension 04/12/2007    REFERRING DIAG: left breast cancer at risk for lymphedema  THERAPY DIAG:  Aftercare following surgery for neoplasm  PERTINENT HISTORY: Patient was diagnosed on 10/11/21 with left grade 2. It measures 0.2 cm and is located in the upper outer quadrant. It is ER/PR+, HER2- with a Ki67 of 5%. Pt underwent a L breast lumpectomy and SLNB (0/2) on 11/27/21   PRECAUTIONS: left UE Lymphedema risk, None  SUBJECTIVE: Pt returns for her 3 month L-Dex screen.   PAIN:  Are you having pain? No  SOZO SCREENING:  Patient was assessed today using the SOZO machine to determine the lymphedema index score. This was compared to her baseline score. It was determined that she is NOT within the recommended range when compared to her baseline for the second month. It is recommended she return for a PT evaluation at this time to learn self MLD. This initiated the PTA consulting with a PT. PT determined it would be appropriate to initiate therapy at this time. PT requested a referral from patient's provider.    L-DEX FLOWSHEETS - 04/27/23 0800       L-DEX LYMPHEDEMA SCREENING   Measurement Type Unilateral    L-DEX MEASUREMENT EXTREMITY Upper Extremity  POSITION  Standing    DOMINANT SIDE Right    At Risk Side Left    BASELINE SCORE (UNILATERAL) 0.5    L-DEX SCORE (UNILATERAL) 9.6    VALUE CHANGE (UNILAT) 9.1            P: Eval for self MLD and redo SOZO in 1 month.  Hermenia Bers, PTA 04/27/2023, 8:22 AM

## 2023-04-28 ENCOUNTER — Other Ambulatory Visit: Payer: Self-pay | Admitting: Surgery

## 2023-05-06 DIAGNOSIS — M65941 Unspecified synovitis and tenosynovitis, right hand: Secondary | ICD-10-CM | POA: Insufficient documentation

## 2023-05-06 DIAGNOSIS — M65312 Trigger thumb, left thumb: Secondary | ICD-10-CM | POA: Diagnosis not present

## 2023-05-06 DIAGNOSIS — M65841 Other synovitis and tenosynovitis, right hand: Secondary | ICD-10-CM | POA: Diagnosis not present

## 2023-05-06 DIAGNOSIS — M65332 Trigger finger, left middle finger: Secondary | ICD-10-CM | POA: Diagnosis not present

## 2023-05-08 ENCOUNTER — Other Ambulatory Visit (HOSPITAL_BASED_OUTPATIENT_CLINIC_OR_DEPARTMENT_OTHER): Payer: Self-pay

## 2023-05-13 ENCOUNTER — Other Ambulatory Visit: Payer: Self-pay

## 2023-05-13 ENCOUNTER — Encounter: Payer: Self-pay | Admitting: Physical Therapy

## 2023-05-13 ENCOUNTER — Ambulatory Visit: Payer: Commercial Managed Care - PPO | Attending: Surgery | Admitting: Physical Therapy

## 2023-05-13 DIAGNOSIS — C50212 Malignant neoplasm of upper-inner quadrant of left female breast: Secondary | ICD-10-CM | POA: Insufficient documentation

## 2023-05-13 DIAGNOSIS — I890 Lymphedema, not elsewhere classified: Secondary | ICD-10-CM

## 2023-05-13 DIAGNOSIS — Z170 Estrogen receptor positive status [ER+]: Secondary | ICD-10-CM

## 2023-05-13 NOTE — Therapy (Signed)
OUTPATIENT PHYSICAL THERAPY  UPPER EXTREMITY ONCOLOGY EVALUATION  Patient Name: Laura Patrick MRN: [id number] DOB:1955/08/10, 67 y.o., female Today's Date: 05/13/2023  END OF SESSION:  PT End of Session - 05/13/23 1051     Visit Number 1    Number of Visits 9    Date for PT Re-Evaluation 06/10/23    PT Start Time 1006    PT Stop Time 1051    PT Time Calculation (min) 45 min    Activity Tolerance Patient tolerated treatment well    Behavior During Therapy [facility] for tasks assessed/performed             Past Medical History:  Diagnosis Date   Anemia    Cancer (HCC) 10/2021   left breast IDC   History of radiation therapy    Left Breast- 01/28/22-02/27/22 Dr. Antony Blackbird   Hypertension    Ovarian cyst    Stage I breast cancer in female [facility]) 12/27/2021   Past Surgical History:  Procedure Laterality Date   BREAST BIOPSY Left 12/02/2022   [place] LT BREAST BX W LOC DEV 1ST LESION IMG BX SPEC US GUIDE 12/02/2022 AP-ULTRASOUND   BREAST BIOPSY Left 12/02/2022   [place] LT BREAST BX W LOC DEV EA ADD LESION IMG BX SPEC US GUIDE 12/02/2022 AP-ULTRASOUND   BREAST LUMPECTOMY WITH RADIOACTIVE SEED AND SENTINEL LYMPH NODE BIOPSY Left 11/27/2021   Procedure: LEFT BREAST LUMPECTOMY WITH RADIOACTIVE SEEDS X3 AND SENTINEL LYMPH NODE BIOPSY;  Surgeon: Harriette Bouillon, MD;  Location: [facility];  Service: General;  Laterality: Left;   COLONOSCOPY     OOPHORECTOMY Right 1988   OVARIAN CYST REMOVAL     TOE SURGERY     Patient Active Problem List   Diagnosis Date Noted   Stage I breast cancer in female (HCC) 12/27/2021   Retinal arteriolar sclerosis 11/13/2020   Vitamin D deficiency 12/28/2019   History of vitamin D deficiency 05/29/2017   Physical exam 08/10/2014   Encounter for Papanicolaou smear of cervix 08/10/2014   Morbid obesity (HCC) 10/14/2013   Pain in limb 05/23/2010   Pes planus 05/23/2010   PARESTHESIA 05/23/2010   HYPOPOTASSEMIA 08/30/2008   Hyperlipidemia  04/12/2007   SYNDROME, CARPAL TUNNEL 04/12/2007   Essential hypertension 04/12/2007    PCP: Neena Rhymes, MD  REFERRING PROVIDER: Harriette Bouillon, MD  REFERRING DIAG: I89.0 (ICD-10-CM) - Lymphedema  THERAPY DIAG:  Lymphedema, not elsewhere classified  Malignant neoplasm of upper-inner quadrant of left breast in female, estrogen receptor positive (HCC)  ONSET DATE: 04/27/23  Rationale for Evaluation and Treatment: Rehabilitation  SUBJECTIVE:  SUBJECTIVE STATEMENT: I was teetering on the edge with my sozo score and this time I went up again so I had to come in. I have had 2 cortisone shots in my hand for trigger finger in July 2024 and 05/07/23.   PERTINENT HISTORY: Patient was diagnosed on 10/11/21 with left grade 2. It measures 0.2 cm and is located in the upper outer quadrant. It is ER/PR+, HER2- with a Ki67 of 5%. Pt underwent a L breast lumpectomy and SLNB (0/2) on 11/27/21   PAIN:  Are you having pain? No  PRECAUTIONS: Other: L UE lymphedema risk  RED FLAGS: None   WEIGHT BEARING RESTRICTIONS: No  FALLS:  Has patient fallen in last 6 months? 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  COGNITION: Overall cognitive status: Within functional limits for tasks assessed    OBSERVATIONS / OTHER ASSESSMENTS: no fullness observable but fullness measurable at L upper arm  UPPER EXTREMITY  AROM/PROM: WFL   LYMPHEDEMA ASSESSMENTS:   SURGERY TYPE/DATE: 11/27/21 L breast lumpectomy and SLNB  NUMBER OF LYMPH NODES REMOVED: 0/2  CHEMOTHERAPY: did not require  RADIATION:completed  HORMONE TREATMENT: taking letrozole  INFECTIONS: none   LYMPHEDEMA ASSESSMENTS:   LANDMARK RIGHT  eval  At axilla  36  15 cm proximal to olecranon process 34.5  10 cm proximal to olecranon process 34  Olecranon process 29  15 cm proximal to ulnar styloid process 27  10 cm proximal to ulnar styloid process 23.5  Just proximal to ulnar styloid process 17.3  Across hand at thumb web space 20.2  At base of 2nd digit 6.9  (Blank rows = not tested)  LANDMARK LEFT  eval  At axilla  36.5  15 cm proximal to olecranon process 35.4  10 cm proximal to olecranon process 33.3  Olecranon process 27.8  15 cm proximal to ulnar styloid process 27  10 cm proximal to ulnar styloid process 22.8  Just proximal to ulnar styloid process 17  Across hand at thumb web space 20.1  At base of 2nd digit 6.9  (Blank rows = not tested)   QUICK DASH SURVEY:   Neldon Mc - 05/13/23 0001     Open a tight or new jar Mild difficulty    Do heavy household chores (wash walls, wash floors) No difficulty    Carry a shopping bag or briefcase No difficulty    Wash your back Mild difficulty    Use a knife to cut food No difficulty    Recreational activities in which you take some force or impact through your arm, shoulder, or hand (golf, hammering, tennis) No difficulty    During the past week, to what extent has your arm, shoulder or hand problem interfered with your normal social activities with family, friends, neighbors, or groups? Not at all    During the past week, to what extent has your arm, shoulder or hand problem limited your work or other regular daily activities Not at all    Arm, shoulder, or hand pain. None    Tingling (pins and needles) in your arm, shoulder, or hand  None    Difficulty Sleeping No  difficulty    DASH Score 4.55 %               TODAY'S TREATMENT:                                                                                                                                          DATE:  05/13/23: Manual lymph drainage in supine as follows: short neck, right axillary nodes, left inguinal nodes, superficial and deep abdominals; anterior inter-axillary anastamoses, left axillo-inguinal anastamoses. Left upper extremity from fingers and dorsal hand to lateral shoulder redirecting along pathways. Educated pt in anatomy and physiology of the lymphatic system throughout.   PATIENT EDUCATION:  Education details: basic principles of self MLD, anatomy and physiology of the lymphatic system Person educated: Patient Education method: Explanation Education comprehension: verbalized understanding  HOME EXERCISE PROGRAM: Continue to wear sleeve during day time hours  ASSESSMENT:  CLINICAL IMPRESSION: Patient is a 67 y.o. female who was seen today for physical therapy evaluation and treatment for L UE lymphedema. Pt's ldex score had increased to  7.4 on 03/23/23. She was issued a compression sleeve and wore it 12 hrs a day for four weeks and when she returned on 04/27/23 it had increased again to 9.1 despite compression. Pt was referred to PT to instruct her in self manual lymphatic drainage to help decrease lymphedema. She would benefit from skilled PT services to learn how to manage lymphedema independently and hopefully reduce her ldex score to below 6.5 (the cut off for sub clinical lymphedema).    OBJECTIVE IMPAIRMENTS: decreased knowledge of condition, decreased knowledge of use of DME, and increased edema.   ACTIVITY LIMITATIONS:  none  PARTICIPATION LIMITATIONS:  none  PERSONAL FACTORS:  none  are also affecting patient's functional outcome.   REHAB POTENTIAL: Good  CLINICAL DECISION MAKING: Stable/uncomplicated  EVALUATION COMPLEXITY: Low  GOALS: Goals  reviewed with patient? Yes  SHORT TERM GOALS=LONG TERM GOALS Target date: 06/10/23  Pt will be independent in self MLD for long term management of lymphedema.  Baseline: Goal status: INITIAL  2.  Pt will reduce ldex score to below 6.5 reversing subclinical lymphedema.  Baseline: 9.1 Goal status: INITIAL PLAN:  PT FREQUENCY: 2x/week  PT DURATION: 4 weeks  PLANNED INTERVENTIONS: Therapeutic exercises, Therapeutic activity, Patient/Family education, Self Care, Orthotic/Fit training, Manual lymph drainage, Compression bandaging, scar mobilization, Taping, and Vasopneumatic device  PLAN FOR NEXT SESSION: instruct pt in self MLD for LUE  [employer], PT 05/13/2023, 11:00 AM

## 2023-05-26 ENCOUNTER — Ambulatory Visit: Payer: Commercial Managed Care - PPO

## 2023-05-27 ENCOUNTER — Ambulatory Visit: Payer: Commercial Managed Care - PPO

## 2023-05-27 ENCOUNTER — Telehealth: Payer: Self-pay

## 2023-05-27 NOTE — Telephone Encounter (Signed)
Pt did not come to her last 8:00 and she has another one tomorrow so LVM to remind her of appt and to let her know to please call us if she wants to cancel it.

## 2023-05-28 ENCOUNTER — Ambulatory Visit: Payer: Commercial Managed Care - PPO

## 2023-05-29 ENCOUNTER — Other Ambulatory Visit (HOSPITAL_BASED_OUTPATIENT_CLINIC_OR_DEPARTMENT_OTHER): Payer: Self-pay

## 2023-05-29 MED ORDER — INFLUENZA VAC A&B SURF ANT ADJ 0.5 ML IM SUSY
0.5000 mL | PREFILLED_SYRINGE | Freq: Once | INTRAMUSCULAR | 0 refills | Status: AC
  Filled 2023-05-29: qty 0.5, 1d supply, fill #0

## 2023-05-29 MED ORDER — COVID-19 MRNA VAC-TRIS(PFIZER) 30 MCG/0.3ML IM SUSY
0.3000 mL | PREFILLED_SYRINGE | Freq: Once | INTRAMUSCULAR | 0 refills | Status: AC
Start: 2023-05-29 — End: 2023-05-30
  Filled 2023-05-29: qty 0.3, 1d supply, fill #0

## 2023-06-02 ENCOUNTER — Ambulatory Visit: Payer: Commercial Managed Care - PPO

## 2023-06-02 DIAGNOSIS — C50212 Malignant neoplasm of upper-inner quadrant of left female breast: Secondary | ICD-10-CM

## 2023-06-02 DIAGNOSIS — I890 Lymphedema, not elsewhere classified: Secondary | ICD-10-CM | POA: Diagnosis not present

## 2023-06-02 DIAGNOSIS — Z170 Estrogen receptor positive status [ER+]: Secondary | ICD-10-CM | POA: Diagnosis not present

## 2023-06-02 NOTE — Therapy (Signed)
OUTPATIENT PHYSICAL THERAPY  UPPER EXTREMITY ONCOLOGY TREATMENT  Patient Name: Laura Patrick MRN: [id number] DOB:03/05/1956, 67 y.o., female Today's Date: 06/02/2023  END OF SESSION:  PT End of Session - 06/02/23 0809     Visit Number 2    Number of Visits 9    Date for PT Re-Evaluation 06/10/23    PT Start Time 0805    PT Stop Time 0900    PT Time Calculation (min) 55 min    Activity Tolerance Patient tolerated treatment well    Behavior During Therapy [facility] for tasks assessed/performed             Past Medical History:  Diagnosis Date   Anemia    Cancer (HCC) 10/2021   left breast IDC   History of radiation therapy    Left Breast- 01/28/22-02/27/22 Dr. Antony Blackbird   Hypertension    Ovarian cyst    Stage I breast cancer in female [facility]) 12/27/2021   Past Surgical History:  Procedure Laterality Date   BREAST BIOPSY Left 12/02/2022   [place] LT BREAST BX W LOC DEV 1ST LESION IMG BX SPEC US GUIDE 12/02/2022 AP-ULTRASOUND   BREAST BIOPSY Left 12/02/2022   [place] LT BREAST BX W LOC DEV EA ADD LESION IMG BX SPEC US GUIDE 12/02/2022 AP-ULTRASOUND   BREAST LUMPECTOMY WITH RADIOACTIVE SEED AND SENTINEL LYMPH NODE BIOPSY Left 11/27/2021   Procedure: LEFT BREAST LUMPECTOMY WITH RADIOACTIVE SEEDS X3 AND SENTINEL LYMPH NODE BIOPSY;  Surgeon: Harriette Bouillon, MD;  Location: [facility];  Service: General;  Laterality: Left;   COLONOSCOPY     OOPHORECTOMY Right 1988   OVARIAN CYST REMOVAL     TOE SURGERY     Patient Active Problem List   Diagnosis Date Noted   Stage I breast cancer in female (HCC) 12/27/2021   Retinal arteriolar sclerosis 11/13/2020   Vitamin D deficiency 12/28/2019   History of vitamin D deficiency 05/29/2017   Physical exam 08/10/2014   Encounter for Papanicolaou smear of cervix 08/10/2014   Morbid obesity (HCC) 10/14/2013   Pain in limb 05/23/2010   Pes planus 05/23/2010   PARESTHESIA 05/23/2010   HYPOPOTASSEMIA 08/30/2008   Hyperlipidemia  04/12/2007   SYNDROME, CARPAL TUNNEL 04/12/2007   Essential hypertension 04/12/2007    PCP: Neena Rhymes, MD  REFERRING PROVIDER: Harriette Bouillon, MD  REFERRING DIAG: I89.0 (ICD-10-CM) - Lymphedema  THERAPY DIAG:  Lymphedema, not elsewhere classified  Malignant neoplasm of upper-inner quadrant of left breast in female, estrogen receptor positive (HCC)  ONSET DATE: 04/27/23  Rationale for Evaluation and Treatment: Rehabilitation  SUBJECTIVE:  SUBJECTIVE STATEMENT: I've been wearing the compression sleeve.    PERTINENT HISTORY: Patient was diagnosed on 10/11/21 with left grade 2. It measures 0.2 cm and is located in the upper outer quadrant. It is ER/PR+, HER2- with a Ki67 of 5%. Pt underwent a L breast lumpectomy and SLNB (0/2) on 11/27/21   PAIN:  Are you having pain? No  PRECAUTIONS: Other: L UE lymphedema risk  RED FLAGS: None   WEIGHT BEARING RESTRICTIONS: No  FALLS:  Has patient fallen in last 6 months? 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  COGNITION: Overall cognitive status: Within functional limits for tasks assessed    OBSERVATIONS / OTHER ASSESSMENTS: no fullness observable but fullness measurable at L upper arm  UPPER EXTREMITY AROM/PROM: WFL   LYMPHEDEMA ASSESSMENTS:   SURGERY TYPE/DATE: 11/27/21 L breast lumpectomy and SLNB  NUMBER OF LYMPH NODES REMOVED:  0/2  CHEMOTHERAPY: did not require  RADIATION:completed  HORMONE TREATMENT: taking letrozole  INFECTIONS: none   LYMPHEDEMA ASSESSMENTS:   LANDMARK RIGHT  eval  At axilla  36  15 cm proximal to olecranon process 34.5  10 cm proximal to olecranon process 34  Olecranon process 29  15 cm proximal to ulnar styloid process 27  10 cm proximal to ulnar styloid process 23.5  Just proximal to ulnar styloid process 17.3  Across hand at thumb web space 20.2  At base of 2nd digit 6.9  (Blank rows = not tested)  LANDMARK LEFT  eval  At axilla  36.5  15 cm proximal to olecranon process 35.4  10 cm proximal to olecranon process 33.3  Olecranon process 27.8  15 cm proximal to ulnar styloid process 27  10 cm proximal to ulnar styloid process 22.8  Just proximal to ulnar styloid process 17  Across hand at thumb web space 20.1  At base of 2nd digit 6.9  (Blank rows = not tested)   QUICK DASH SURVEY:       TODAY'S TREATMENT:                                                                                                                                          DATE:  06/02/23: Manual Therapy MLD: In Supine: Short neck, superficial and deep abdominals, Lt inguinal nodes, Lt axillo-inguinal anastomosis, then Rt axillary nodes, Rt intact anterior thorax and anterior inter-axillary anastomosis, then Lt UE working from proximal to distal then retracing all steps and having pt begin to return demo and handout issued. P/ROM to  Lt shoulder into flex, abd and D2 with scapular depression by therapist througout STM to Lt pect and incision   05/13/23: Manual lymph drainage in supine as follows: short neck, right axillary nodes, left inguinal nodes, superficial and deep abdominals; anterior inter-axillary anastamoses, left axillo-inguinal anastamoses. Left upper extremity from fingers and dorsal hand to lateral shoulder redirecting along pathways. Educated pt in anatomy and physiology of the  lymphatic system throughout.   PATIENT EDUCATION:  Education details: MLD to LT UE Person educated: Patient Education method: Explanation, demonstration and handout issued Education comprehension: verbalized understanding, returned demo and will benefit from further review  HOME EXERCISE PROGRAM: Continue to wear sleeve during day time hours 06/02/23: Self MLD  ASSESSMENT:  CLINICAL IMPRESSION: Began instruction of Lt UE MLD and started instructing pt. Handout issued.    OBJECTIVE IMPAIRMENTS: decreased knowledge of condition, decreased knowledge of use of DME, and increased edema.   ACTIVITY LIMITATIONS:  none  PARTICIPATION LIMITATIONS:  none  PERSONAL FACTORS:  none  are also affecting patient's functional outcome.   REHAB POTENTIAL: Good  CLINICAL DECISION MAKING: Stable/uncomplicated  EVALUATION COMPLEXITY: Low  GOALS: Goals reviewed with patient? Yes  SHORT TERM GOALS=LONG TERM GOALS Target date: 06/10/23  Pt will be independent in self MLD for long term management of lymphedema.  Baseline: Goal status: INITIAL  2.  Pt will reduce ldex score to below 6.5 reversing subclinical lymphedema.  Baseline: 9.1 Goal status: INITIAL PLAN:  PT FREQUENCY: 2x/week  PT DURATION: 4 weeks  PLANNED INTERVENTIONS: Therapeutic exercises, Therapeutic activity, Patient/Family education, Self Care, Orthotic/Fit training, Manual lymph drainage, Compression bandaging, scar mobilization, Taping, and Vasopneumatic device  PLAN FOR NEXT SESSION: Cont pt in self MLD for LUE  Hermenia Bers, PTA 06/02/2023, 9:24 AM   Cancer Rehab [phone] Start with circles near neck at collarbones 10 times at each side.  Deep Effective Breath   Standing, sitting, or laying down, place both hands on the belly. Take a deep breath IN, expanding the belly; then breath OUT, contracting the belly. Repeat __5__ times. Do __2-3__ sessions per day and before your self massage.   Axilla  to Axilla - Sweep   On uninvolved side make 5 circles in the armpit, then pump _5__ times from involved armpit across chest to uninvolved armpit, making a pathway. Do _1__ time per day.  Copyright  VHI. All rights reserved.  Axilla to Inguinal Nodes - Sweep   On involved side, make 5 circles at groin at panty line, then pump _5__ times from armpit along side of trunk to outer hip, making your other pathway. Do __1_ time per day.  Copyright  VHI. All rights reserved.  Arm Posterior: Elbow to Shoulder - Sweep   Pump _5__ times from back of elbow to top of shoulder. Then inner to outer upper arm _5_ times, then outer arm again _5_ times. Then back to the pathways _2-3_ times. Do _1__ time per day.  Copyright  VHI. All rights reserved.  ARM: Volar Wrist to Elbow - Sweep   Pump or stationary circles _5__ times from wrist to elbow making sure to do both sides of the forearm. Then retrace your steps to the outer arm, and the pathways _2-3_ times each. Do _1__ time per day.  Copyright  VHI. All rights reserved.  ARM: Dorsum of Hand to Shoulder - Sweep   Pump or stationary circles _5__ times on back of hand including knuckle spaces and individual fingers if needed working up towards the  wrist, then retrace all your steps working back up the forearm, doing both sides; upper outer arm and back to your pathways _2-3_ times each. Then do 5 circles again at uninvolved armpit and involved groin where you started! Good job!! Do __1_ time per day.  Copyright  VHI. All rights reserved.

## 2023-06-04 ENCOUNTER — Ambulatory Visit: Payer: Commercial Managed Care - PPO

## 2023-06-04 DIAGNOSIS — Z170 Estrogen receptor positive status [ER+]: Secondary | ICD-10-CM | POA: Diagnosis not present

## 2023-06-04 DIAGNOSIS — I890 Lymphedema, not elsewhere classified: Secondary | ICD-10-CM

## 2023-06-04 DIAGNOSIS — C50212 Malignant neoplasm of upper-inner quadrant of left female breast: Secondary | ICD-10-CM | POA: Diagnosis not present

## 2023-06-04 NOTE — Therapy (Signed)
OUTPATIENT PHYSICAL THERAPY  UPPER EXTREMITY ONCOLOGY TREATMENT  Patient Name: Laura Patrick MRN: [id number] DOB:1955-08-30, 67 y.o., female Today's Date: 06/04/2023  END OF SESSION:  PT End of Session - 06/04/23 0806     Visit Number 3    Number of Visits 9    Date for PT Re-Evaluation 06/10/23    PT Start Time 0803    PT Stop Time 0902    PT Time Calculation (min) 59 min    Activity Tolerance Patient tolerated treatment well    Behavior During Therapy [facility] for tasks assessed/performed             Past Medical History:  Diagnosis Date   Anemia    Cancer (HCC) 10/2021   left breast IDC   History of radiation therapy    Left Breast- 01/28/22-02/27/22 Dr. Antony Blackbird   Hypertension    Ovarian cyst    Stage I breast cancer in female [facility]) 12/27/2021   Past Surgical History:  Procedure Laterality Date   BREAST BIOPSY Left 12/02/2022   [place] LT BREAST BX W LOC DEV 1ST LESION IMG BX SPEC US GUIDE 12/02/2022 AP-ULTRASOUND   BREAST BIOPSY Left 12/02/2022   [place] LT BREAST BX W LOC DEV EA ADD LESION IMG BX SPEC US GUIDE 12/02/2022 AP-ULTRASOUND   BREAST LUMPECTOMY WITH RADIOACTIVE SEED AND SENTINEL LYMPH NODE BIOPSY Left 11/27/2021   Procedure: LEFT BREAST LUMPECTOMY WITH RADIOACTIVE SEEDS X3 AND SENTINEL LYMPH NODE BIOPSY;  Surgeon: Harriette Bouillon, MD;  Location: [facility];  Service: General;  Laterality: Left;   COLONOSCOPY     OOPHORECTOMY Right 1988   OVARIAN CYST REMOVAL     TOE SURGERY     Patient Active Problem List   Diagnosis Date Noted   Stage I breast cancer in female (HCC) 12/27/2021   Retinal arteriolar sclerosis 11/13/2020   Vitamin D deficiency 12/28/2019   History of vitamin D deficiency 05/29/2017   Physical exam 08/10/2014   Encounter for Papanicolaou smear of cervix 08/10/2014   Morbid obesity (HCC) 10/14/2013   Pain in limb 05/23/2010   Pes planus 05/23/2010   PARESTHESIA 05/23/2010   HYPOPOTASSEMIA 08/30/2008   Hyperlipidemia  04/12/2007   SYNDROME, CARPAL TUNNEL 04/12/2007   Essential hypertension 04/12/2007    PCP: Neena Rhymes, MD  REFERRING PROVIDER: Harriette Bouillon, MD  REFERRING DIAG: I89.0 (ICD-10-CM) - Lymphedema  THERAPY DIAG:  Lymphedema, not elsewhere classified  Malignant neoplasm of upper-inner quadrant of left breast in female, estrogen receptor positive (HCC)  ONSET DATE: 04/27/23  Rationale for Evaluation and Treatment: Rehabilitation  SUBJECTIVE:  SUBJECTIVE STATEMENT: I could tell a difference after the last session! My Lt shoulder felt better.    PERTINENT HISTORY: Patient was diagnosed on 10/11/21 with left grade 2. It measures 0.2 cm and is located in the upper outer quadrant. It is ER/PR+, HER2- with a Ki67 of 5%. Pt underwent a L breast lumpectomy and SLNB (0/2) on 11/27/21   PAIN:  Are you having pain? No  PRECAUTIONS: Other: L UE lymphedema risk  RED FLAGS: None   WEIGHT BEARING RESTRICTIONS: No  FALLS:  Has patient fallen in last 6 months? 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  COGNITION: Overall cognitive status: Within functional limits for tasks assessed    OBSERVATIONS / OTHER ASSESSMENTS: no fullness observable but fullness measurable at L upper arm  UPPER EXTREMITY AROM/PROM: WFL   LYMPHEDEMA ASSESSMENTS:   SURGERY TYPE/DATE: 11/27/21 L breast lumpectomy and  SLNB  NUMBER OF LYMPH NODES REMOVED: 0/2  CHEMOTHERAPY: did not require  RADIATION:completed  HORMONE TREATMENT: taking letrozole  INFECTIONS: none   LYMPHEDEMA ASSESSMENTS:   LANDMARK RIGHT  eval  At axilla  36  15 cm proximal to olecranon process 34.5  10 cm proximal to olecranon process 34  Olecranon process 29  15 cm proximal to ulnar styloid process 27  10 cm proximal to ulnar styloid process 23.5  Just proximal to ulnar styloid process 17.3  Across hand at thumb web space 20.2  At base of 2nd digit 6.9  (Blank rows = not tested)  LANDMARK LEFT  eval  At axilla  36.5  15 cm proximal to olecranon process 35.4  10 cm proximal to olecranon process 33.3  Olecranon process 27.8  15 cm proximal to ulnar styloid process 27  10 cm proximal to ulnar styloid process 22.8  Just proximal to ulnar styloid process 17  Across hand at thumb web space 20.1  At base of 2nd digit 6.9  (Blank rows = not tested)   QUICK DASH SURVEY:       TODAY'S TREATMENT:                                                                                                                                          DATE:  06/04/23: Manual Therapy MLD: In Supine: Short neck, superficial and deep abdominals, Lt inguinal nodes, Lt axillo-inguinal anastomosis, then Rt axillary nodes, Rt intact anterior thorax and anterior inter-axillary anastomosis, then Lt UE working from proximal to distal then retracing all steps, also included medial breast  where firmness palpable. P/ROM to Lt shoulder into flex, abd and D2 with scapular depression by therapist througout STM to Lt pect and incision   06/02/23: Manual Therapy MLD: In Supine: Short neck, superficial and deep abdominals, Lt inguinal nodes, Lt axillo-inguinal anastomosis, then Rt axillary nodes, Rt intact anterior thorax and anterior inter-axillary anastomosis, then Lt UE working from proximal to distal then retracing all steps and having pt begin to  return demo and handout issued. P/ROM to Lt shoulder into flex, abd and D2 with scapular depression by therapist througout STM to Lt pect and incision   05/13/23: Manual lymph drainage in supine as follows: short neck, right axillary nodes, left inguinal nodes, superficial and deep abdominals; anterior inter-axillary anastamoses, left axillo-inguinal anastamoses. Left upper extremity from fingers and dorsal hand to lateral shoulder redirecting along pathways. Educated pt in anatomy and physiology of the lymphatic system throughout.   PATIENT EDUCATION:  Education details: MLD to LT UE Person educated: Patient Education method: Programmer, multimedia, demonstration and handout issued Education comprehension: verbalized understanding, returned demo and will benefit from further review  HOME EXERCISE PROGRAM: Continue to wear sleeve during day time hours 06/02/23: Self MLD  ASSESSMENT:  CLINICAL IMPRESSION: Continued manual therapy to Lt upper quadrant including MLD to Lt UE and breast (prn as some firmness palpated at medial breast today).   OBJECTIVE IMPAIRMENTS: decreased knowledge of condition, decreased knowledge of use of DME, and increased edema.   ACTIVITY LIMITATIONS:  none  PARTICIPATION LIMITATIONS:  none  PERSONAL FACTORS:  none  are also affecting patient's functional outcome.   REHAB POTENTIAL: Good  CLINICAL DECISION MAKING: Stable/uncomplicated  EVALUATION COMPLEXITY: Low  GOALS: Goals reviewed with patient? Yes  SHORT TERM GOALS=LONG TERM GOALS Target date: 06/10/23  Pt will be independent in self MLD for long term management of lymphedema.  Baseline: Goal status: INITIAL  2.  Pt will reduce ldex score to below 6.5 reversing subclinical lymphedema.  Baseline: 9.1 Goal status: INITIAL PLAN:  PT FREQUENCY: 2x/week  PT DURATION: 4 weeks  PLANNED INTERVENTIONS: Therapeutic exercises, Therapeutic activity, Patient/Family education, Self Care, Orthotic/Fit training,  Manual lymph drainage, Compression bandaging, scar mobilization, Taping, and Vasopneumatic device  PLAN FOR NEXT SESSION: Cont MLD for Lt UE and review with pt for same. Also cont Lt shoulder P/ROM and other manual therapies to decrease Lt upper quadrant tightness.   Hermenia Bers, PTA 06/04/2023, 9:06 AM   Cancer Rehab [phone] Start with circles near neck at collarbones 10 times at each side.  Deep Effective Breath   Standing, sitting, or laying down, place both hands on the belly. Take a deep breath IN, expanding the belly; then breath OUT, contracting the belly. Repeat __5__ times. Do __2-3__ sessions per day and before your self massage.   Axilla to Axilla - Sweep   On uninvolved side make 5 circles in the armpit, then pump _5__ times from involved armpit across chest to uninvolved armpit, making a pathway. Do _1__ time per day.  Copyright  VHI. All rights reserved.  Axilla to Inguinal Nodes - Sweep   On involved side, make 5 circles at groin at panty line, then pump _5__ times from armpit along side of trunk to outer hip, making your other pathway. Do __1_ time per day.  Copyright  VHI. All rights reserved.  Arm Posterior: Elbow to Shoulder - Sweep   Pump _5__ times from back of elbow to top of shoulder. Then inner to outer upper arm _5_ times, then outer arm  again _5_ times. Then back to the pathways _2-3_ times. Do _1__ time per day.  Copyright  VHI. All rights reserved.  ARM: Volar Wrist to Elbow - Sweep   Pump or stationary circles _5__ times from wrist to elbow making sure to do both sides of the forearm. Then retrace your steps to the outer arm, and the pathways _2-3_ times each. Do _1__ time per day.  Copyright  VHI. All rights reserved.  ARM: Dorsum of Hand to Shoulder - Sweep   Pump or stationary circles _5__ times on back of hand including knuckle spaces and individual fingers if needed working up towards the wrist, then retrace all your  steps working back up the forearm, doing both sides; upper outer arm and back to your pathways _2-3_ times each. Then do 5 circles again at uninvolved armpit and involved groin where you started! Good job!! Do __1_ time per day.  Copyright  VHI. All rights reserved.

## 2023-06-08 DIAGNOSIS — D239 Other benign neoplasm of skin, unspecified: Secondary | ICD-10-CM | POA: Diagnosis not present

## 2023-06-08 DIAGNOSIS — D485 Neoplasm of uncertain behavior of skin: Secondary | ICD-10-CM | POA: Diagnosis not present

## 2023-06-09 ENCOUNTER — Encounter: Payer: Self-pay | Admitting: Physical Therapy

## 2023-06-09 ENCOUNTER — Ambulatory Visit: Payer: Commercial Managed Care - PPO | Attending: Surgery | Admitting: Physical Therapy

## 2023-06-09 DIAGNOSIS — I890 Lymphedema, not elsewhere classified: Secondary | ICD-10-CM | POA: Insufficient documentation

## 2023-06-09 DIAGNOSIS — C50212 Malignant neoplasm of upper-inner quadrant of left female breast: Secondary | ICD-10-CM | POA: Diagnosis not present

## 2023-06-09 DIAGNOSIS — Z170 Estrogen receptor positive status [ER+]: Secondary | ICD-10-CM | POA: Insufficient documentation

## 2023-06-09 NOTE — Therapy (Signed)
OUTPATIENT PHYSICAL THERAPY  UPPER EXTREMITY ONCOLOGY TREATMENT  Patient Name: Laura Patrick MRN: [id number] DOB:[birth year], 67 y.o., female Today's Date: 06/09/2023  END OF SESSION:  PT End of Session - 06/09/23 0806     Visit Number 4    Number of Visits 9    Date for PT Re-Evaluation 06/10/23    PT Start Time 0805    PT Stop Time 0851    PT Time Calculation (min) 46 min    Activity Tolerance Patient tolerated treatment well    Behavior During Therapy [facility] for tasks assessed/performed              Past Medical History:  Diagnosis Date   Anemia    Cancer (HCC) 10/2021   left breast IDC   History of radiation therapy    Left Breast- 01/28/22-02/27/22 Dr. Antony Blackbird   Hypertension    Ovarian cyst    Stage I breast cancer in female [facility]) 12/27/2021   Past Surgical History:  Procedure Laterality Date   BREAST BIOPSY Left 12/02/2022   [place] LT BREAST BX W LOC DEV 1ST LESION IMG BX SPEC US GUIDE 12/02/2022 AP-ULTRASOUND   BREAST BIOPSY Left 12/02/2022   [place] LT BREAST BX W LOC DEV EA ADD LESION IMG BX SPEC US GUIDE 12/02/2022 AP-ULTRASOUND   BREAST LUMPECTOMY WITH RADIOACTIVE SEED AND SENTINEL LYMPH NODE BIOPSY Left 11/27/2021   Procedure: LEFT BREAST LUMPECTOMY WITH RADIOACTIVE SEEDS X3 AND SENTINEL LYMPH NODE BIOPSY;  Surgeon: Harriette Bouillon, MD;  Location: [facility];  Service: General;  Laterality: Left;   COLONOSCOPY     OOPHORECTOMY Right 1988   OVARIAN CYST REMOVAL     TOE SURGERY     Patient Active Problem List   Diagnosis Date Noted   Stage I breast cancer in female (HCC) 12/27/2021   Retinal arteriolar sclerosis 11/13/2020   Vitamin D deficiency 12/28/2019   History of vitamin D deficiency 05/29/2017   Physical exam 08/10/2014   Encounter for Papanicolaou smear of cervix 08/10/2014   Morbid obesity (HCC) 10/14/2013   Pain in limb 05/23/2010   Pes planus 05/23/2010   PARESTHESIA 05/23/2010   HYPOPOTASSEMIA 08/30/2008   Hyperlipidemia  04/12/2007   SYNDROME, CARPAL TUNNEL 04/12/2007   Essential hypertension 04/12/2007    PCP: Neena Rhymes, MD  REFERRING PROVIDER: Harriette Bouillon, MD  REFERRING DIAG: I89.0 (ICD-10-CM) - Lymphedema  THERAPY DIAG:  Lymphedema, not elsewhere classified  Malignant neoplasm of upper-inner quadrant of left breast in female, estrogen receptor positive (HCC)  ONSET DATE: 04/27/23  Rationale for Evaluation and Treatment: Rehabilitation  SUBJECTIVE:  SUBJECTIVE STATEMENT: I have been doing what I am supposed to.    PERTINENT HISTORY: Patient was diagnosed on 10/11/21 with left grade 2. It measures 0.2 cm and is located in the upper outer quadrant. It is ER/PR+, HER2- with a Ki67 of 5%. Pt underwent a L breast lumpectomy and SLNB (0/2) on 11/27/21   PAIN:  Are you having pain? No  PRECAUTIONS: Other: L UE lymphedema risk  RED FLAGS: None   WEIGHT BEARING RESTRICTIONS: No  FALLS:  Has patient fallen in last 6 months? 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  COGNITION: Overall cognitive status: Within functional limits for tasks assessed    OBSERVATIONS / OTHER ASSESSMENTS: no fullness observable but fullness measurable at L upper arm  UPPER EXTREMITY AROM/PROM: WFL   LYMPHEDEMA ASSESSMENTS:   SURGERY TYPE/DATE: 11/27/21 L breast lumpectomy and SLNB  NUMBER OF LYMPH NODES REMOVED:  0/2  CHEMOTHERAPY: did not require  RADIATION:completed  HORMONE TREATMENT: taking letrozole  INFECTIONS: none   LYMPHEDEMA ASSESSMENTS:   LANDMARK RIGHT  eval  At axilla  36  15 cm proximal to olecranon process 34.5  10 cm proximal to olecranon process 34  Olecranon process 29  15 cm proximal to ulnar styloid process 27  10 cm proximal to ulnar styloid process 23.5  Just proximal to ulnar styloid process 17.3  Across hand at thumb web space 20.2  At base of 2nd digit 6.9  (Blank rows = not tested)  LANDMARK LEFT  eval  At axilla  36.5  15 cm proximal to olecranon process 35.4  10 cm proximal to olecranon process 33.3  Olecranon process 27.8  15 cm proximal to ulnar styloid process 27  10 cm proximal to ulnar styloid process 22.8  Just proximal to ulnar styloid process 17  Across hand at thumb web space 20.1  At base of 2nd digit 6.9  (Blank rows = not tested)   QUICK DASH SURVEY:       TODAY'S TREATMENT:                                                                                                                                          DATE:  06/09/23: Manual Therapy Had pt demonstrate what she is doing at home. She was immediately working on the UE starting at the hand so educated pt in entire sequence and importance of following each step in order. Provided v/c for correct skin stretch and speed and had pt demonstrate entire sequence  as follows: MLD: In Supine: Short neck, superficial and deep abdominals, Lt inguinal nodes, Lt axillo-inguinal anastomosis, then Rt axillary nodes, Rt intact anterior thorax and anterior inter-axillary anastomosis, then Lt UE working from proximal to distal then retracing all steps. Instructed pt in MLD for L breast especially medial breast where there is increased fibrosis. Pt performed first part of breast MLD and then therapist worked on L breast as well as STM to area of fibrosis/scar tissue in superior breast then retraced  pathways.   06/04/23: Manual Therapy MLD: In Supine: Short neck, superficial and deep abdominals, Lt inguinal nodes, Lt axillo-inguinal anastomosis, then Rt axillary nodes, Rt intact anterior thorax and anterior inter-axillary anastomosis, then Lt UE working from proximal to distal then retracing all steps, also included medial breast where firmness palpable. P/ROM to Lt shoulder into flex, abd and D2 with scapular depression by therapist througout STM to Lt pect and incision   06/02/23: Manual Therapy MLD: In Supine: Short neck, superficial and deep abdominals, Lt inguinal nodes, Lt axillo-inguinal anastomosis, then Rt axillary nodes, Rt intact anterior thorax and anterior inter-axillary anastomosis, then Lt UE working from proximal to distal then retracing all steps and having pt begin to return demo and handout issued. P/ROM to Lt shoulder into flex, abd and D2 with scapular depression by therapist througout STM to Lt pect and incision   05/13/23: Manual lymph drainage in supine as follows: short neck, right axillary nodes, left inguinal nodes, superficial and deep abdominals; anterior inter-axillary anastamoses, left axillo-inguinal anastamoses. Left upper extremity from fingers and dorsal hand to lateral shoulder redirecting along pathways. Educated pt in anatomy and physiology of the lymphatic system throughout.   PATIENT EDUCATION:  Education details: MLD to LT UE Person educated: Patient Education method: Explanation, demonstration and handout issued Education comprehension: verbalized understanding, returned demo and will benefit from further review  HOME EXERCISE PROGRAM: Continue to wear sleeve during day time hours 06/02/23: Self MLD  ASSESSMENT:  CLINICAL IMPRESSION: Continued educating pt in proper self MLD technique and importance of each step of the sequence and why they have to be performed in order. By end of session pt was able to demonstrate improved skin stretch  technique and improved understanding of sequence.   OBJECTIVE IMPAIRMENTS: decreased knowledge of condition, decreased knowledge of use of DME, and increased edema.   ACTIVITY LIMITATIONS:  none  PARTICIPATION LIMITATIONS:  none  PERSONAL FACTORS:  none  are also affecting patient's functional outcome.   REHAB POTENTIAL: Good  CLINICAL DECISION MAKING: Stable/uncomplicated  EVALUATION COMPLEXITY: Low  GOALS: Goals reviewed with patient? Yes  SHORT TERM GOALS=LONG TERM GOALS Target date: 06/10/23  Pt will be independent in self MLD for long term management of lymphedema.  Baseline: Goal status: INITIAL  2.  Pt will reduce ldex score to below 6.5 reversing subclinical lymphedema.  Baseline: 9.1 Goal status: INITIAL PLAN:  PT FREQUENCY: 2x/week  PT DURATION: 4 weeks  PLANNED INTERVENTIONS: Therapeutic exercises, Therapeutic activity, Patient/Family education, Self Care, Orthotic/Fit training, Manual lymph drainage, Compression bandaging, scar mobilization, Taping, and Vasopneumatic device  PLAN FOR NEXT SESSION: Cont MLD for Lt UE and review with pt for same. Also cont Lt shoulder P/ROM and other manual therapies to decrease Lt upper quadrant tightness.   Milagros Loll Graham, PT 06/09/2023, 8:57 AM   Cancer Rehab [phone] Start with circles near neck at collarbones 10 times at each side.  Deep Effective Breath   Standing, sitting, or laying down, place both hands on the belly.  Take a deep breath IN, expanding the belly; then breath OUT, contracting the belly. Repeat __5__ times. Do __2-3__ sessions per day and before your self massage.   Axilla to Axilla - Sweep   On uninvolved side make 5 circles in the armpit, then pump _5__ times from involved armpit across chest to uninvolved armpit, making a pathway. Do _1__ time per day.  Copyright  VHI. All rights reserved.  Axilla to Inguinal Nodes - Sweep   On involved side, make 5 circles at groin at panty line,  then pump _5__ times from armpit along side of trunk to outer hip, making your other pathway. Do __1_ time per day.  Copyright  VHI. All rights reserved.  Arm Posterior: Elbow to Shoulder - Sweep   Pump _5__ times from back of elbow to top of shoulder. Then inner to outer upper arm _5_ times, then outer arm again _5_ times. Then back to the pathways _2-3_ times. Do _1__ time per day.  Copyright  VHI. All rights reserved.  ARM: Volar Wrist to Elbow - Sweep   Pump or stationary circles _5__ times from wrist to elbow making sure to do both sides of the forearm. Then retrace your steps to the outer arm, and the pathways _2-3_ times each. Do _1__ time per day.  Copyright  VHI. All rights reserved.  ARM: Dorsum of Hand to Shoulder - Sweep   Pump or stationary circles _5__ times on back of hand including knuckle spaces and individual fingers if needed working up towards the wrist, then retrace all your steps working back up the forearm, doing both sides; upper outer arm and back to your pathways _2-3_ times each. Then do 5 circles again at uninvolved armpit and involved groin where you started! Good job!! Do __1_ time per day.  Copyright  VHI. All rights reserved.

## 2023-06-11 ENCOUNTER — Ambulatory Visit: Payer: Commercial Managed Care - PPO

## 2023-06-11 VITALS — Wt 195.4 lb

## 2023-06-11 DIAGNOSIS — I890 Lymphedema, not elsewhere classified: Secondary | ICD-10-CM

## 2023-06-11 DIAGNOSIS — C50212 Malignant neoplasm of upper-inner quadrant of left female breast: Secondary | ICD-10-CM | POA: Diagnosis not present

## 2023-06-11 DIAGNOSIS — Z170 Estrogen receptor positive status [ER+]: Secondary | ICD-10-CM | POA: Diagnosis not present

## 2023-06-11 NOTE — Therapy (Addendum)
OUTPATIENT PHYSICAL THERAPY  UPPER EXTREMITY ONCOLOGY TREATMENT  Patient Name: Laura Patrick MRN: [id number] DOB:Sep 14, 1955, 67 y.o., female Today's Date: 06/11/2023  END OF SESSION:  PT End of Session - 06/11/23 0808     Visit Number 5    Number of Visits 9    Date for PT Re-Evaluation 06/10/23   D/C this visit   PT Start Time 0801    PT Stop Time 0858    PT Time Calculation (min) 57 min    Activity Tolerance Patient tolerated treatment well    Behavior During Therapy [facility] for tasks assessed/performed              Past Medical History:  Diagnosis Date   Anemia    Cancer (HCC) 10/2021   left breast IDC   History of radiation therapy    Left Breast- 01/28/22-02/27/22 Dr. Antony Blackbird   Hypertension    Ovarian cyst    Stage I breast cancer in female [facility]) 12/27/2021   Past Surgical History:  Procedure Laterality Date   BREAST BIOPSY Left 12/02/2022   [place] LT BREAST BX W LOC DEV 1ST LESION IMG BX SPEC US GUIDE 12/02/2022 AP-ULTRASOUND   BREAST BIOPSY Left 12/02/2022   [place] LT BREAST BX W LOC DEV EA ADD LESION IMG BX SPEC US GUIDE 12/02/2022 AP-ULTRASOUND   BREAST LUMPECTOMY WITH RADIOACTIVE SEED AND SENTINEL LYMPH NODE BIOPSY Left 11/27/2021   Procedure: LEFT BREAST LUMPECTOMY WITH RADIOACTIVE SEEDS X3 AND SENTINEL LYMPH NODE BIOPSY;  Surgeon: Harriette Bouillon, MD;  Location: [facility];  Service: General;  Laterality: Left;   COLONOSCOPY     OOPHORECTOMY Right 1988   OVARIAN CYST REMOVAL     TOE SURGERY     Patient Active Problem List   Diagnosis Date Noted   Stage I breast cancer in female (HCC) 12/27/2021   Retinal arteriolar sclerosis 11/13/2020   Vitamin D deficiency 12/28/2019   History of vitamin D deficiency 05/29/2017   Physical exam 08/10/2014   Encounter for Papanicolaou smear of cervix 08/10/2014   Morbid obesity (HCC) 10/14/2013   Pain in limb 05/23/2010   Pes planus 05/23/2010   PARESTHESIA 05/23/2010   HYPOPOTASSEMIA 08/30/2008    Hyperlipidemia 04/12/2007   SYNDROME, CARPAL TUNNEL 04/12/2007   Essential hypertension 04/12/2007    PCP: Neena Rhymes, MD  REFERRING PROVIDER: Harriette Bouillon, MD  REFERRING DIAG: I89.0 (ICD-10-CM) - Lymphedema  THERAPY DIAG:  Lymphedema, not elsewhere classified  Malignant neoplasm of upper-inner quadrant of left breast in female, estrogen receptor positive (HCC)  ONSET DATE: 04/27/23  Rationale for Evaluation and Treatment: Rehabilitation  SUBJECTIVE:  SUBJECTIVE STATEMENT: I'm ready for review, I've been practicing my self MLD.    PERTINENT HISTORY: Patient was diagnosed on 10/11/21 with left grade 2. It measures 0.2 cm and is located in the upper outer quadrant. It is ER/PR+, HER2- with a Ki67 of 5%. Pt underwent a L breast lumpectomy and SLNB (0/2) on 11/27/21   PAIN:  Are you having pain? No  PRECAUTIONS: Other: L UE lymphedema risk  RED FLAGS: None   WEIGHT BEARING RESTRICTIONS: No  FALLS:  Has patient fallen in last 6 months? 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  COGNITION: Overall cognitive status: Within functional limits for tasks assessed    OBSERVATIONS / OTHER ASSESSMENTS: no fullness observable but fullness measurable at L upper arm  UPPER EXTREMITY AROM/PROM: WFL   LYMPHEDEMA ASSESSMENTS:   SURGERY TYPE/DATE: 11/27/21 L breast lumpectomy and  SLNB  NUMBER OF LYMPH NODES REMOVED: 0/2  CHEMOTHERAPY: did not require  RADIATION:completed  HORMONE TREATMENT: taking letrozole  INFECTIONS: none   LYMPHEDEMA ASSESSMENTS:   LANDMARK RIGHT  eval  At axilla  36  15 cm proximal to olecranon process 34.5  10 cm proximal to olecranon process 34  Olecranon process 29  15 cm proximal to ulnar styloid process 27  10 cm proximal to ulnar styloid process 23.5  Just proximal to ulnar styloid process 17.3  Across hand at thumb web space 20.2  At base of 2nd digit 6.9  (Blank rows = not tested)  LANDMARK LEFT  eval  At axilla  36.5  15 cm proximal to olecranon process 35.4  10 cm proximal to olecranon process 33.3  Olecranon process 27.8  15 cm proximal to ulnar styloid process 27  10 cm proximal to ulnar styloid process 22.8  Just proximal to ulnar styloid process 17  Across hand at thumb web space 20.1  At base of 2nd digit 6.9  (Blank rows = not tested)   QUICK DASH SURVEY:    L-DEX FLOWSHEETS - 06/11/23 0800       L-DEX LYMPHEDEMA SCREENING   Measurement Type Unilateral    L-DEX MEASUREMENT EXTREMITY Upper Extremity    POSITION  Standing    DOMINANT SIDE Right    At Risk Side Left    BASELINE SCORE (UNILATERAL) 0.5    L-DEX SCORE (UNILATERAL) 4.8    VALUE CHANGE (UNILAT) 4.3                TODAY'S TREATMENT:  DATE:  06/11/23: Manual Therapy Reviewed MLD to Lt UE while therapist performed as follows: In Supine: Short neck, superficial and deep abdominals, Lt inguinal nodes, Lt axillo-inguinal anastomosis, then Rt axillary nodes, Rt intact anterior thorax and anterior inter-axillary anastomosis, then Lt UE working from proximal to distal then retracing all steps, also included medial breast where firmness palpable. P/ROM to Lt shoulder into flex, abd and D2 with scapular  depression by therapist throughout STM to scar tissue in Lt axilla, this softens some after STM so reviewed with pt importance of including this with end ROM stretching to prevent further unneccasary strain to lymphatics.  SOZO re done and pt back WNLs so can return to 3 month L-Dex screens.   06/09/23: Manual Therapy Had pt demonstrate what she is doing at home. She was immediately working on the UE starting at the hand so educated pt in entire sequence and importance of following each step in order. Provided v/c for correct skin stretch and speed and had pt demonstrate entire sequence as follows: MLD: In Supine: Short neck, superficial and deep abdominals, Lt inguinal nodes, Lt axillo-inguinal anastomosis, then Rt axillary nodes, Rt intact anterior thorax and anterior inter-axillary anastomosis, then Lt UE working from proximal to distal then retracing all steps. Instructed pt in MLD for L breast especially medial breast where there is increased fibrosis. Pt performed first part of breast MLD and then therapist worked on L breast as well as STM to area of fibrosis/scar tissue in superior breast then retraced pathways.   06/04/23: Manual Therapy MLD: In Supine: Short neck, superficial and deep abdominals, Lt inguinal nodes, Lt axillo-inguinal anastomosis, then Rt axillary nodes, Rt intact anterior thorax and anterior inter-axillary anastomosis, then Lt UE working from proximal to distal then retracing all steps, also included medial breast where firmness palpable. P/ROM to Lt shoulder into flex, abd and D2 with scapular depression by therapist througout STM to Lt pect and incision   06/02/23: Manual Therapy MLD: In Supine: Short neck, superficial and deep abdominals, Lt inguinal nodes, Lt axillo-inguinal anastomosis, then Rt axillary nodes, Rt intact anterior thorax and anterior inter-axillary anastomosis, then Lt UE working from proximal to distal then retracing all steps and having pt begin to  return demo and handout issued. P/ROM to Lt shoulder into flex, abd and D2 with scapular depression by therapist througout STM to Lt pect and incision   05/13/23: Manual lymph drainage in supine as follows: short neck, right axillary nodes, left inguinal nodes, superficial and deep abdominals; anterior inter-axillary anastamoses, left axillo-inguinal anastamoses. Left upper extremity from fingers and dorsal hand to lateral shoulder redirecting along pathways. Educated pt in anatomy and physiology of the lymphatic system throughout.   PATIENT EDUCATION:  Education details: MLD to LT UE Person educated: Patient Education method: Explanation, demonstration and handout issued Education comprehension: verbalized understanding, returned demo and will benefit from further review  HOME EXERCISE PROGRAM: Continue to wear sleeve during day time hours 06/02/23: Self MLD  ASSESSMENT:  CLINICAL IMPRESSION: Pt has done well with this episode of care and met goals. She is ready for D/C at this time. She will resume every 3 month L-Dex screens.   OBJECTIVE IMPAIRMENTS: decreased knowledge of condition, decreased knowledge of use of DME, and increased edema.   ACTIVITY LIMITATIONS:  none  PARTICIPATION LIMITATIONS:  none  PERSONAL FACTORS:  none  are also affecting patient's functional outcome.   REHAB POTENTIAL: Good  CLINICAL DECISION MAKING: Stable/uncomplicated  EVALUATION COMPLEXITY: Low  GOALS: Goals  reviewed with patient? Yes  SHORT TERM GOALS=LONG TERM GOALS Target date: 06/10/23  Pt will be independent in self MLD for long term management of lymphedema.  Baseline: Goal status: MET  2.  Pt will reduce ldex score to below 6.5 reversing subclinical lymphedema.  Baseline: 9.1; 06/11/23 - 4.3 Goal status: MET PLAN:  PT FREQUENCY: 2x/week  PT DURATION: 4 weeks  PLANNED INTERVENTIONS: Therapeutic exercises, Therapeutic activity, Patient/Family education, Self Care, Orthotic/Fit  training, Manual lymph drainage, Compression bandaging, scar mobilization, Taping, and Vasopneumatic device  PLAN FOR NEXT SESSION: Cont MLD for Lt UE and review with pt for same. Also cont Lt shoulder P/ROM and other manual therapies to decrease Lt upper quadrant tightness.   Hermenia Bers, PTA 06/11/2023, 9:07 AM   Cancer Rehab [phone] Start with circles near neck at collarbones 10 times at each side.  Deep Effective Breath   Standing, sitting, or laying down, place both hands on the belly. Take a deep breath IN, expanding the belly; then breath OUT, contracting the belly. Repeat __5__ times. Do __2-3__ sessions per day and before your self massage.   Axilla to Axilla - Sweep   On uninvolved side make 5 circles in the armpit, then pump _5__ times from involved armpit across chest to uninvolved armpit, making a pathway. Do _1__ time per day.  Copyright  VHI. All rights reserved.  Axilla to Inguinal Nodes - Sweep   On involved side, make 5 circles at groin at panty line, then pump _5__ times from armpit along side of trunk to outer hip, making your other pathway. Do __1_ time per day.  Copyright  VHI. All rights reserved.  Arm Posterior: Elbow to Shoulder - Sweep   Pump _5__ times from back of elbow to top of shoulder. Then inner to outer upper arm _5_ times, then outer arm again _5_ times. Then back to the pathways _2-3_ times. Do _1__ time per day.  Copyright  VHI. All rights reserved.  ARM: Volar Wrist to Elbow - Sweep   Pump or stationary circles _5__ times from wrist to elbow making sure to do both sides of the forearm. Then retrace your steps to the outer arm, and the pathways _2-3_ times each. Do _1__ time per day.  Copyright  VHI. All rights reserved.  ARM: Dorsum of Hand to Shoulder - Sweep   Pump or stationary circles _5__ times on back of hand including knuckle spaces and individual fingers if needed working up towards the wrist, then retrace  all your steps working back up the forearm, doing both sides; upper outer arm and back to your pathways _2-3_ times each. Then do 5 circles again at uninvolved armpit and involved groin where you started! Good job!! Do __1_ time per day.  Copyright  VHI. All rights reserved.   PHYSICAL THERAPY DISCHARGE SUMMARY  Visits from Start of Care: 5  Current functional level related to goals / functional outcomes: All goals met   Remaining deficits: None   Education / Equipment: Self MLD   Patient agrees to discharge. Patient goals were met. Patient is being discharged due to meeting the stated rehab goals.  Laura Patrick, [place] 06/11/23 11:02 AM

## 2023-06-25 ENCOUNTER — Other Ambulatory Visit (HOSPITAL_BASED_OUTPATIENT_CLINIC_OR_DEPARTMENT_OTHER): Payer: Self-pay

## 2023-07-14 ENCOUNTER — Telehealth: Payer: Self-pay

## 2023-07-14 ENCOUNTER — Other Ambulatory Visit: Payer: Self-pay | Admitting: Family Medicine

## 2023-07-14 ENCOUNTER — Other Ambulatory Visit (HOSPITAL_BASED_OUTPATIENT_CLINIC_OR_DEPARTMENT_OTHER): Payer: Self-pay

## 2023-07-14 DIAGNOSIS — I10 Essential (primary) hypertension: Secondary | ICD-10-CM

## 2023-07-14 MED ORDER — VALSARTAN-HYDROCHLOROTHIAZIDE 320-12.5 MG PO TABS
1.0000 | ORAL_TABLET | Freq: Every day | ORAL | 0 refills | Status: DC
  Filled 2023-07-14: qty 30, 30d supply, fill #0

## 2023-07-14 MED ORDER — AMLODIPINE BESYLATE 10 MG PO TABS
10.0000 mg | ORAL_TABLET | Freq: Every day | ORAL | 0 refills | Status: DC
  Filled 2023-07-14: qty 30, 30d supply, fill #0

## 2023-07-14 MED ORDER — POTASSIUM CHLORIDE CRYS ER 20 MEQ PO TBCR
20.0000 meq | EXTENDED_RELEASE_TABLET | Freq: Every day | ORAL | 0 refills | Status: DC
  Filled 2023-07-14: qty 30, 30d supply, fill #0

## 2023-07-14 NOTE — Telephone Encounter (Signed)
Pt called back, and scheduled for 08/06/23 at 8am

## 2023-07-14 NOTE — Telephone Encounter (Signed)
Left vm to call office to schedule

## 2023-08-03 ENCOUNTER — Other Ambulatory Visit (HOSPITAL_BASED_OUTPATIENT_CLINIC_OR_DEPARTMENT_OTHER): Payer: Self-pay

## 2023-08-06 ENCOUNTER — Ambulatory Visit: Payer: Commercial Managed Care - PPO | Admitting: Family Medicine

## 2023-08-06 ENCOUNTER — Encounter: Payer: Self-pay | Admitting: Family Medicine

## 2023-08-06 VITALS — BP 124/72 | HR 63 | Temp 97.8°F | Ht 60.0 in | Wt 196.0 lb

## 2023-08-06 DIAGNOSIS — Z23 Encounter for immunization: Secondary | ICD-10-CM

## 2023-08-06 DIAGNOSIS — I10 Essential (primary) hypertension: Secondary | ICD-10-CM

## 2023-08-06 DIAGNOSIS — E559 Vitamin D deficiency, unspecified: Secondary | ICD-10-CM | POA: Diagnosis not present

## 2023-08-06 DIAGNOSIS — Z0000 Encounter for general adult medical examination without abnormal findings: Secondary | ICD-10-CM | POA: Diagnosis not present

## 2023-08-06 LAB — CBC WITH DIFFERENTIAL/PLATELET
Basophils Absolute: 0 10*3/uL (ref 0.0–0.1)
Basophils Relative: 0.5 % (ref 0.0–3.0)
Eosinophils Absolute: 0.1 10*3/uL (ref 0.0–0.7)
Eosinophils Relative: 2.3 % (ref 0.0–5.0)
HCT: 40.3 % (ref 36.0–46.0)
Hemoglobin: 13.2 g/dL (ref 12.0–15.0)
Lymphocytes Relative: 31.4 % (ref 12.0–46.0)
Lymphs Abs: 1.3 10*3/uL (ref 0.7–4.0)
MCHC: 32.8 g/dL (ref 30.0–36.0)
MCV: 86.9 fL (ref 78.0–100.0)
Monocytes Absolute: 0.4 10*3/uL (ref 0.1–1.0)
Monocytes Relative: 10.5 % (ref 3.0–12.0)
Neutro Abs: 2.3 10*3/uL (ref 1.4–7.7)
Neutrophils Relative %: 55.3 % (ref 43.0–77.0)
Platelets: 310 10*3/uL (ref 150.0–400.0)
RBC: 4.64 Mil/uL (ref 3.87–5.11)
RDW: 13.9 % (ref 11.5–15.5)
WBC: 4.2 10*3/uL (ref 4.0–10.5)

## 2023-08-06 LAB — HEPATIC FUNCTION PANEL
ALT: 16 U/L (ref 0–35)
AST: 17 U/L (ref 0–37)
Albumin: 4.6 g/dL (ref 3.5–5.2)
Alkaline Phosphatase: 102 U/L (ref 39–117)
Bilirubin, Direct: 0.1 mg/dL (ref 0.0–0.3)
Total Bilirubin: 0.4 mg/dL (ref 0.2–1.2)
Total Protein: 8.2 g/dL (ref 6.0–8.3)

## 2023-08-06 LAB — BASIC METABOLIC PANEL
BUN: 11 mg/dL (ref 6–23)
CO2: 29 meq/L (ref 19–32)
Calcium: 10.1 mg/dL (ref 8.4–10.5)
Chloride: 103 meq/L (ref 96–112)
Creatinine, Ser: 0.79 mg/dL (ref 0.40–1.20)
GFR: 76.16 mL/min (ref >60.00)
Glucose, Bld: 100 mg/dL — ABNORMAL HIGH (ref 70–99)
Potassium: 3.8 meq/L (ref 3.5–5.1)
Sodium: 140 meq/L (ref 135–145)

## 2023-08-06 LAB — VITAMIN D 25 HYDROXY (VIT D DEFICIENCY, FRACTURES): VITD: 47.87 ng/mL (ref 30.00–100.00)

## 2023-08-06 LAB — LIPID PANEL
Cholesterol: 167 mg/dL (ref 0–200)
HDL: 50.1 mg/dL (ref >39.00)
LDL Cholesterol: 82 mg/dL (ref 0–99)
NonHDL: 117.35
Total CHOL/HDL Ratio: 3
Triglycerides: 176 mg/dL — ABNORMAL HIGH (ref 0.0–149.0)
VLDL: 35.2 mg/dL (ref 0.0–40.0)

## 2023-08-06 LAB — TSH: TSH: 3.03 u[IU]/mL (ref 0.35–5.50)

## 2023-08-06 NOTE — Patient Instructions (Signed)
 Follow up in 6 months to recheck blood pressure We'll notify you of your lab results and make any changes if needed Continue to work on healthy diet and regular exercise- you look great!! Call with any questions or concerns Stay Safe!  Stay Healthy! Happy New Year!!

## 2023-08-06 NOTE — Assessment & Plan Note (Signed)
 Pt's PE WNL w/ exception of BMI.  UTD on mammo, colonoscopy, Tdap, flu.  Prevnar 20 given today.  Check labs.  Anticipatory guidance provided.

## 2023-08-06 NOTE — Assessment & Plan Note (Signed)
 Well controlled.  Currently asymptomatic.  Check labs but no anticipated med changes at this time

## 2023-08-06 NOTE — Addendum Note (Signed)
 Addended by: Ester Rink on: 08/06/2023 08:51 AM   Modules accepted: Orders

## 2023-08-06 NOTE — Progress Notes (Signed)
   Subjective:    Patient ID: Laura Patrick, female    DOB: [birth year], 68 y.o.   MRN: [id number]  HPI CPE- UTD on mammo, colonoscopy, Tdap.  Due for PNA vaccine  Patient Care Team    Relationship Specialty Notifications Start End  Mahlon Comer BRAVO, MD PCP - General Family Medicine  10/14/13   Timmy Maude JONELLE, MD Medical Oncologist Oncology  10/11/21     Health Maintenance  Topic Date Due   Pneumonia Vaccine 25+ Years old (1 of 2 - PCV) Never done   Zoster Vaccines- Shingrix (1 of 2) Never done   COVID-19 Vaccine (5 - 2024-25 season) 07/24/2023   MAMMOGRAM  11/20/2023   Colonoscopy  09/04/2027   DTaP/Tdap/Td (3 - Td or Tdap) 01/25/2031   INFLUENZA VACCINE  Completed   DEXA SCAN  Completed   Hepatitis C Screening  Completed   HPV VACCINES  Aged Out      Review of Systems Patient reports no vision/ hearing changes, adenopathy,fever, weight change,  persistant/recurrent hoarseness , swallowing issues, chest pain, palpitations, edema, persistant/recurrent cough, hemoptysis, dyspnea (rest/exertional/paroxysmal nocturnal), gastrointestinal bleeding (melena, rectal bleeding), abdominal pain, significant heartburn, bowel changes, GU symptoms (dysuria, hematuria, incontinence), Gyn symptoms (abnormal  bleeding, pain),  syncope, focal weakness, memory loss, numbness & tingling, skin/hair/nail changes, abnormal bruising or bleeding, anxiety, or depression.     Objective:   Physical Exam General Appearance:    Alert, cooperative, no distress, appears stated age, obese  Head:    Normocephalic, without obvious abnormality, atraumatic  Eyes:    PERRL, conjunctiva/corneas clear, EOM's intact both eyes  Ears:    Normal TM's and external ear canals, both ears  Nose:   Nares normal, septum midline, mucosa normal, no drainage    or sinus tenderness  Throat:   Lips, mucosa, and tongue normal; teeth and gums normal  Neck:   Supple, symmetrical, trachea midline, no adenopathy;    Thyroid : no  enlargement/tenderness/nodules  Back:     Symmetric, no curvature, ROM normal, no CVA tenderness  Lungs:     Clear to auscultation bilaterally, respirations unlabored  Chest Wall:    No tenderness or deformity   Heart:    Regular rate and rhythm, S1 and S2 normal, no murmur, rub   or gallop  Breast Exam:    Deferred to GYN  Abdomen:     Soft, non-tender, bowel sounds active all four quadrants,    no masses, no organomegaly  Genitalia:    Deferred to GYN  Rectal:    Extremities:   Extremities normal, atraumatic, no cyanosis or edema  Pulses:   2+ and symmetric all extremities  Skin:   Skin color, texture, turgor normal, no rashes or lesions  Lymph nodes:   Cervical, supraclavicular, and axillary nodes normal  Neurologic:   CNII-XII intact, normal strength, sensation and reflexes    throughout          Assessment & Plan:

## 2023-08-10 ENCOUNTER — Telehealth: Payer: Self-pay

## 2023-08-10 NOTE — Telephone Encounter (Signed)
-----   Message from Neena Rhymes sent at 08/10/2023  9:51 AM EST ----- Labs look great!  No changes at this time

## 2023-08-10 NOTE — Telephone Encounter (Signed)
 Left vm to call office about lab results or via MyChart

## 2023-08-27 ENCOUNTER — Other Ambulatory Visit: Payer: Self-pay | Admitting: Family Medicine

## 2023-08-27 ENCOUNTER — Other Ambulatory Visit (HOSPITAL_BASED_OUTPATIENT_CLINIC_OR_DEPARTMENT_OTHER): Payer: Self-pay

## 2023-08-27 DIAGNOSIS — I10 Essential (primary) hypertension: Secondary | ICD-10-CM

## 2023-08-27 MED ORDER — AMLODIPINE BESYLATE 10 MG PO TABS
10.0000 mg | ORAL_TABLET | Freq: Every day | ORAL | 0 refills | Status: DC
  Filled 2023-08-27: qty 30, 30d supply, fill #0

## 2023-09-07 ENCOUNTER — Other Ambulatory Visit (HOSPITAL_COMMUNITY): Payer: Self-pay | Admitting: Family

## 2023-09-07 ENCOUNTER — Encounter (HOSPITAL_COMMUNITY): Payer: Self-pay | Admitting: Family

## 2023-09-07 DIAGNOSIS — Z853 Personal history of malignant neoplasm of breast: Secondary | ICD-10-CM

## 2023-09-14 ENCOUNTER — Ambulatory Visit: Payer: Commercial Managed Care - PPO | Attending: Surgery

## 2023-09-14 VITALS — Wt 197.1 lb

## 2023-09-14 DIAGNOSIS — Z483 Aftercare following surgery for neoplasm: Secondary | ICD-10-CM | POA: Insufficient documentation

## 2023-09-14 NOTE — Therapy (Signed)
 OUTPATIENT PHYSICAL THERAPY SOZO SCREENING NOTE   Patient Name: Laura Patrick MRN: [id number] DOB:October 09, 1955, 68 y.o., female Today's Date: 09/14/2023  PCP: Jess Morita, MD REFERRING PROVIDER: Sim Dryer, MD   PT End of Session - 09/14/23 0830     Visit Number 5   # unchanged due to screen only   PT Start Time 0829    PT Stop Time 0836    PT Time Calculation (min) 7 min    Activity Tolerance Patient tolerated treatment well    Behavior During Therapy [facility] for tasks assessed/performed             Past Medical History:  Diagnosis Date   Anemia    Cancer (HCC) 10/2021   left breast IDC   History of radiation therapy    Left Breast- 01/28/22-02/27/22 Dr. Retta Caster   Hypertension    Ovarian cyst    Stage I breast cancer in female [facility]) 12/27/2021   Past Surgical History:  Procedure Laterality Date   BREAST BIOPSY Left 12/02/2022   US  LT BREAST BX W LOC DEV 1ST LESION IMG BX SPEC US  GUIDE 12/02/2022 AP-ULTRASOUND   BREAST BIOPSY Left 12/02/2022   US  LT BREAST BX W LOC DEV EA ADD LESION IMG BX SPEC US  GUIDE 12/02/2022 AP-ULTRASOUND   BREAST LUMPECTOMY WITH RADIOACTIVE SEED AND SENTINEL LYMPH NODE BIOPSY Left 11/27/2021   Procedure: LEFT BREAST LUMPECTOMY WITH RADIOACTIVE SEEDS X3 AND SENTINEL LYMPH NODE BIOPSY;  Surgeon: Sim Dryer, MD;  Location: [facility];  Service: General;  Laterality: Left;   COLONOSCOPY     OOPHORECTOMY Right 1988   OVARIAN CYST REMOVAL     TOE SURGERY     Patient Active Problem List   Diagnosis Date Noted   Tenosynovitis of right hand 05/06/2023   Acquired trigger finger of left middle finger 02/25/2023   Stage I breast cancer in female (HCC) 12/27/2021   Retinal arteriolar sclerosis 11/13/2020   Vitamin D  deficiency 12/28/2019   Bilateral carpal tunnel syndrome 04/14/2019   Bilateral hand pain 02/10/2019   Physical exam 08/10/2014   Encounter for Papanicolaou smear of cervix 08/10/2014   Morbid obesity  (HCC) 10/14/2013   Pain in limb 05/23/2010   Pes planus 05/23/2010   PARESTHESIA 05/23/2010   HYPOPOTASSEMIA 08/30/2008   Hyperlipidemia 04/12/2007   SYNDROME, CARPAL TUNNEL 04/12/2007   Essential hypertension 04/12/2007    REFERRING DIAG: left breast cancer at risk for lymphedema  THERAPY DIAG:  Aftercare following surgery for neoplasm  PERTINENT HISTORY: Patient was diagnosed on 10/11/21 with left grade 2. It measures 0.2 cm and is located in the upper outer quadrant. It is ER/PR+, HER2- with a Ki67 of 5%. Pt underwent a L breast lumpectomy and SLNB (0/2) on 11/27/21   PRECAUTIONS: left UE Lymphedema risk, None  SUBJECTIVE: Pt returns for her 3 month L-Dex screen. "I felt like my arm was starting to swell so I got more sleeves and started wearing one last week."  PAIN:  Are you having pain? No  SOZO SCREENING:  Patient was assessed today using the SOZO machine to determine the lymphedema index score. This was compared to her baseline score. It was determined that she is NOT within the recommended range when compared to her baseline. It is recommended she return in 1 month to be reassessed. If she continues to measure outside the recommended range, physical therapy treatment will be recommended at that time and a referral requested.    L-DEX FLOWSHEETS -  09/14/23 0800       L-DEX LYMPHEDEMA SCREENING   Measurement Type Unilateral    L-DEX MEASUREMENT EXTREMITY Upper Extremity    POSITION  Standing    DOMINANT SIDE Right    At Risk Side Left    BASELINE SCORE (UNILATERAL) 0.5    L-DEX SCORE (UNILATERAL) 10.7    VALUE CHANGE (UNILAT) 10.2             P: Pt ro return in 1 month for re assess due to high change from baseline.   Denyce Flank, PTA 09/14/2023, 8:36 AM

## 2023-09-21 ENCOUNTER — Other Ambulatory Visit (HOSPITAL_BASED_OUTPATIENT_CLINIC_OR_DEPARTMENT_OTHER): Payer: Self-pay

## 2023-09-21 ENCOUNTER — Other Ambulatory Visit: Payer: Self-pay | Admitting: Family Medicine

## 2023-09-21 DIAGNOSIS — I10 Essential (primary) hypertension: Secondary | ICD-10-CM

## 2023-09-22 ENCOUNTER — Other Ambulatory Visit (HOSPITAL_BASED_OUTPATIENT_CLINIC_OR_DEPARTMENT_OTHER): Payer: Self-pay

## 2023-09-22 MED ORDER — AMLODIPINE BESYLATE 10 MG PO TABS
10.0000 mg | ORAL_TABLET | Freq: Every day | ORAL | 0 refills | Status: DC
  Filled 2023-09-22 – 2023-09-25 (×3): qty 90, 90d supply, fill #0

## 2023-09-25 ENCOUNTER — Other Ambulatory Visit (HOSPITAL_BASED_OUTPATIENT_CLINIC_OR_DEPARTMENT_OTHER): Payer: Self-pay

## 2023-10-26 ENCOUNTER — Ambulatory Visit: Payer: Commercial Managed Care - PPO | Attending: Surgery

## 2023-10-26 DIAGNOSIS — Z483 Aftercare following surgery for neoplasm: Secondary | ICD-10-CM | POA: Insufficient documentation

## 2023-11-09 DIAGNOSIS — H35033 Hypertensive retinopathy, bilateral: Secondary | ICD-10-CM | POA: Diagnosis not present

## 2023-11-24 ENCOUNTER — Ambulatory Visit (HOSPITAL_COMMUNITY)
Admission: RE | Admit: 2023-11-24 | Discharge: 2023-11-24 | Disposition: A | Discharge status: Home / Self Care | Payer: Commercial Managed Care - PPO | Source: Ambulatory Visit | Attending: Family | Admitting: Family

## 2023-11-24 ENCOUNTER — Ambulatory Visit (HOSPITAL_COMMUNITY)
Admission: RE | Admit: 2023-11-24 | Discharge: 2023-11-24 | Disposition: A | Discharge status: Home / Self Care | Payer: Commercial Managed Care - PPO | Source: Ambulatory Visit | Attending: Family

## 2023-11-24 DIAGNOSIS — Z853 Personal history of malignant neoplasm of breast: Secondary | ICD-10-CM

## 2023-11-24 DIAGNOSIS — R92333 Mammographic heterogeneous density, bilateral breasts: Secondary | ICD-10-CM | POA: Diagnosis not present

## 2023-11-24 DIAGNOSIS — C50912 Malignant neoplasm of unspecified site of left female breast: Secondary | ICD-10-CM | POA: Diagnosis not present

## 2023-12-02 ENCOUNTER — Other Ambulatory Visit (HOSPITAL_BASED_OUTPATIENT_CLINIC_OR_DEPARTMENT_OTHER): Payer: Self-pay

## 2023-12-21 ENCOUNTER — Other Ambulatory Visit: Payer: Self-pay | Admitting: Hematology & Oncology

## 2023-12-21 ENCOUNTER — Other Ambulatory Visit: Payer: Self-pay

## 2023-12-21 ENCOUNTER — Other Ambulatory Visit (HOSPITAL_BASED_OUTPATIENT_CLINIC_OR_DEPARTMENT_OTHER): Payer: Self-pay

## 2023-12-21 ENCOUNTER — Other Ambulatory Visit: Payer: Self-pay | Admitting: Family Medicine

## 2023-12-21 DIAGNOSIS — Z8639 Personal history of other endocrine, nutritional and metabolic disease: Secondary | ICD-10-CM

## 2023-12-21 DIAGNOSIS — I10 Essential (primary) hypertension: Secondary | ICD-10-CM

## 2023-12-21 MED ORDER — VITAMIN D (ERGOCALCIFEROL) 1.25 MG (50000 UNIT) PO CAPS
50000.0000 [IU] | ORAL_CAPSULE | ORAL | 1 refills | Status: DC
  Filled 2023-12-21: qty 12, 84d supply, fill #0
  Filled 2024-03-24: qty 12, 84d supply, fill #1

## 2023-12-21 MED ORDER — AMLODIPINE BESYLATE 10 MG PO TABS
10.0000 mg | ORAL_TABLET | Freq: Every day | ORAL | 0 refills | Status: DC
  Filled 2023-12-21: qty 90, 90d supply, fill #0

## 2024-01-21 DIAGNOSIS — M65312 Trigger thumb, left thumb: Secondary | ICD-10-CM | POA: Diagnosis not present

## 2024-01-21 DIAGNOSIS — G5603 Carpal tunnel syndrome, bilateral upper limbs: Secondary | ICD-10-CM | POA: Diagnosis not present

## 2024-02-18 ENCOUNTER — Other Ambulatory Visit (HOSPITAL_COMMUNITY): Payer: Self-pay

## 2024-02-18 ENCOUNTER — Encounter (HOSPITAL_COMMUNITY): Payer: Self-pay

## 2024-02-18 DIAGNOSIS — G5601 Carpal tunnel syndrome, right upper limb: Secondary | ICD-10-CM | POA: Diagnosis not present

## 2024-02-18 MED ORDER — OXYCODONE HCL 5 MG PO TABS
5.0000 mg | ORAL_TABLET | ORAL | 0 refills | Status: AC | PRN
  Filled 2024-02-18: qty 42, 7d supply, fill #0

## 2024-02-18 MED ORDER — DOXYCYCLINE HYCLATE 100 MG PO CAPS
100.0000 mg | ORAL_CAPSULE | Freq: Two times a day (BID) | ORAL | 0 refills | Status: AC
  Filled 2024-02-18: qty 20, 10d supply, fill #0

## 2024-02-29 ENCOUNTER — Other Ambulatory Visit (HOSPITAL_BASED_OUTPATIENT_CLINIC_OR_DEPARTMENT_OTHER): Payer: Self-pay

## 2024-03-03 DIAGNOSIS — M25641 Stiffness of right hand, not elsewhere classified: Secondary | ICD-10-CM | POA: Diagnosis not present

## 2024-03-09 ENCOUNTER — Inpatient Hospital Stay: Attending: Hematology & Oncology

## 2024-03-09 ENCOUNTER — Encounter: Payer: Self-pay | Admitting: Family

## 2024-03-09 ENCOUNTER — Inpatient Hospital Stay: Admitting: Family

## 2024-03-09 VITALS — BP 124/56 | HR 80 | Temp 98.4°F | Resp 18 | Wt 190.0 lb

## 2024-03-09 DIAGNOSIS — Z1732 Human epidermal growth factor receptor 2 negative status: Secondary | ICD-10-CM | POA: Insufficient documentation

## 2024-03-09 DIAGNOSIS — Z79899 Other long term (current) drug therapy: Secondary | ICD-10-CM | POA: Diagnosis not present

## 2024-03-09 DIAGNOSIS — C50919 Malignant neoplasm of unspecified site of unspecified female breast: Secondary | ICD-10-CM

## 2024-03-09 DIAGNOSIS — R202 Paresthesia of skin: Secondary | ICD-10-CM | POA: Insufficient documentation

## 2024-03-09 DIAGNOSIS — Z91030 Bee allergy status: Secondary | ICD-10-CM | POA: Insufficient documentation

## 2024-03-09 DIAGNOSIS — Z17411 Hormone receptor positive with human epidermal growth factor receptor 2 negative status: Secondary | ICD-10-CM | POA: Diagnosis not present

## 2024-03-09 DIAGNOSIS — Z79811 Long term (current) use of aromatase inhibitors: Secondary | ICD-10-CM | POA: Diagnosis not present

## 2024-03-09 DIAGNOSIS — C50412 Malignant neoplasm of upper-outer quadrant of left female breast: Secondary | ICD-10-CM | POA: Insufficient documentation

## 2024-03-09 DIAGNOSIS — Z880 Allergy status to penicillin: Secondary | ICD-10-CM | POA: Insufficient documentation

## 2024-03-09 LAB — CBC WITH DIFFERENTIAL (CANCER CENTER ONLY)
Abs Immature Granulocytes: 0.01 K/uL (ref 0.00–0.07)
Basophils Absolute: 0 K/uL (ref 0.0–0.1)
Basophils Relative: 1 %
Eosinophils Absolute: 0.1 K/uL (ref 0.0–0.5)
Eosinophils Relative: 2 %
HCT: 43.1 % (ref 36.0–46.0)
Hemoglobin: 14 g/dL (ref 12.0–15.0)
Immature Granulocytes: 0 %
Lymphocytes Relative: 36 %
Lymphs Abs: 1.6 K/uL (ref 0.7–4.0)
MCH: 27.8 pg (ref 26.0–34.0)
MCHC: 32.5 g/dL (ref 30.0–36.0)
MCV: 85.7 fL (ref 80.0–100.0)
Monocytes Absolute: 0.4 K/uL (ref 0.1–1.0)
Monocytes Relative: 10 %
Neutro Abs: 2.3 K/uL (ref 1.7–7.7)
Neutrophils Relative %: 51 %
Platelet Count: 296 K/uL (ref 150–400)
RBC: 5.03 MIL/uL (ref 3.87–5.11)
RDW: 14.1 % (ref 11.5–15.5)
WBC Count: 4.4 K/uL (ref 4.0–10.5)
nRBC: 0 % (ref 0.0–0.2)

## 2024-03-09 LAB — CMP (CANCER CENTER ONLY)
ALT: 15 U/L (ref 0–44)
AST: 20 U/L (ref 15–41)
Albumin: 4.8 g/dL (ref 3.5–5.0)
Alkaline Phosphatase: 118 U/L (ref 38–126)
Anion gap: 13 (ref 5–15)
BUN: 10 mg/dL (ref 8–23)
CO2: 25 mmol/L (ref 22–32)
Calcium: 10.7 mg/dL — ABNORMAL HIGH (ref 8.9–10.3)
Chloride: 105 mmol/L (ref 98–111)
Creatinine: 0.89 mg/dL (ref 0.44–1.00)
GFR, Estimated: >60 mL/min (ref >60)
Glucose, Bld: 91 mg/dL (ref 70–99)
Potassium: 5 mmol/L (ref 3.5–5.1)
Sodium: 144 mmol/L (ref 135–145)
Total Bilirubin: 0.5 mg/dL (ref 0.0–1.2)
Total Protein: 8.7 g/dL — ABNORMAL HIGH (ref 6.5–8.1)

## 2024-03-09 NOTE — Progress Notes (Signed)
 Hematology and Oncology Follow Up Visit  Laura Patrick [id number] 11-21-1955 68 y.o. 03/09/2024   Principle Diagnosis:  Stage IA (T1aN0M0) infiltrating ductal carcinoma of the LEFT breast   --ER+/PR+/HER2- -- Oncotype =15   Past Therapy: Status post left lumpectomy on 11/27/2021 Radiation therapy --start in 01/21/2022 -- completed on 02/26/2022   Current Therapy:        Femara  2.5 mg PO daily -start after XRT -- started 03/10/2022   Interim History:  Laura Patrick is here today for follow-up. She is doing quite well and has recuperated from her right hand carpal tunnel release surgery nicely. She has minimal tingling in the hand after doing her exercises. She will return to work in 3 weeks.  She is doing well on Femara  and has not had any issues with hot flashes or night sweats.  No mass, lesion or rash noted on bilateral breast exam.  No adenopathy or lymphedema noted.  No fever, chills, n/v, cough, rash, dizziness, SOB, chest pain, palpitations, abdominal pain or changes in bowel or bladder habits.  No swelling or tenderness in her extremities at this time. She is wearing compression stockings for added support.  No falls or syncope reported.  Appetite and hydration are good. Weight is stable at 190 lbs.   ECOG Performance Status: 1 - Symptomatic but completely ambulatory  Medications:  Allergies as of 03/09/2024       Reactions   Bee Venom Other (See Comments), Swelling   Both elbows to the hands, swells, and red water like blisters appear. Reaction: Sweating (intolerance)   Amoxicillin Rash        Medication List        Accurate as of March 09, 2024  2:25 PM. If you have any questions, ask your nurse or doctor.          amLODipine  10 MG tablet Commonly known as: NORVASC  Take 1 tablet (10 mg total) by mouth daily. LAST REFILL WITHOUT APPT.   doxycycline  100 MG capsule Commonly known as: VIBRAMYCIN  Take 1 capsule (100 mg total) by mouth 2 (two) times daily  for 10 days.   letrozole  2.5 MG tablet Commonly known as: FEMARA  Take 1 tablet (2.5 mg total) by mouth daily.   oxyCODONE  5 MG immediate release tablet Commonly known as: Oxy IR/ROXICODONE  Take 1 tablet (5 mg total) by mouth every 4 - 6 (four-six) hours as needed for pain for 7 days.   potassium chloride  SA 20 MEQ tablet Commonly known as: KLOR-CON  M Take 1 tablet (20 mEq total) by mouth daily. LAST REFILL WITHOUT APPT.   valsartan -hydrochlorothiazide  320-12.5 MG tablet Commonly known as: DIOVAN -HCT Take 1 tablet by mouth daily. LAST REFILL WITHOUT APPT.   Vitamin D  (Ergocalciferol ) 1.25 MG (50000 UNIT) Caps capsule Commonly known as: DRISDOL  Take 1 capsule (50,000 Units total) by mouth every 7 (seven) days.        Allergies:  Allergies  Allergen Reactions   Bee Venom Other (See Comments) and Swelling    Both elbows to the hands, swells, and red water like blisters appear.  Reaction: Sweating (intolerance)   Amoxicillin Rash    Past Medical History, Surgical history, Social history, and Family History were reviewed and updated.  Review of Systems: All other 10 point review of systems is negative.   Physical Exam:  weight is 190 lb (86.2 kg). Her oral temperature is 98.4 F (36.9 C). Her blood pressure is 124/56 (abnormal) and her pulse is 80. Her respiration is 18 and  oxygen saturation is 100%.   Wt Readings from Last 3 Encounters:  03/09/24 190 lb (86.2 kg)  09/14/23 197 lb 2 oz (89.4 kg)  08/06/23 196 lb (88.9 kg)    Ocular: Sclerae unicteric, pupils equal, round and reactive to light Ear-nose-throat: Oropharynx clear, dentition fair Lymphatic: No cervical, supraclavicular or axillary adenopathy Lungs no rales or rhonchi, good excursion bilaterally Heart regular rate and rhythm, no murmur appreciated Abd soft, nontender, positive bowel sounds MSK no focal spinal tenderness, no joint edema Neuro: non-focal, well-oriented, appropriate affect Breasts: Same as  above.   Lab Results  Component Value Date   WBC 4.4 03/09/2024   HGB 14.0 03/09/2024   HCT 43.1 03/09/2024   MCV 85.7 03/09/2024   PLT 296 03/09/2024   No results found for: FERRITIN, IRON, TIBC, UIBC, IRONPCTSAT Lab Results  Component Value Date   RBC 5.03 03/09/2024   No results found for: KPAFRELGTCHN, LAMBDASER, KAPLAMBRATIO No results found for: IGGSERUM, IGA, IGMSERUM No results found for: STEPHANY CARLOTA BENSON MARKEL EARLA JOANNIE DOC VICK, SPEI   Chemistry      Component Value Date/Time   NA 140 08/06/2023 0845   NA 141 01/03/2019 0836   K 3.8 08/06/2023 0845   CL 103 08/06/2023 0845   CO2 29 08/06/2023 0845   BUN 11 08/06/2023 0845   BUN 15 01/03/2019 0836   CREATININE 0.79 08/06/2023 0845   CREATININE 1.04 (H) 04/17/2023 0930   CREATININE 0.91 10/06/2016 1529      Component Value Date/Time   CALCIUM 10.1 08/06/2023 0845   ALKPHOS 102 08/06/2023 0845   AST 17 08/06/2023 0845   AST 15 04/17/2023 0930   ALT 16 08/06/2023 0845   ALT 15 04/17/2023 0930   BILITOT 0.4 08/06/2023 0845   BILITOT 0.5 04/17/2023 0930       Impression and Plan: Laura Patrick is a very pleasant 68 yo postmenopausal African American female with stage 1A infiltrating ductal carcinoma of the left breast with very good prognostic markers, Oncotype score 15.  She underwent a lumpectomy followed by radiotherapy.  She currently is on Femara  and tolerating nicely. No changes indicated at this time.  Follow-up again in another 4 months.   Laura Pepper, NP 8/6/20252:25 PM

## 2024-03-24 ENCOUNTER — Other Ambulatory Visit (HOSPITAL_BASED_OUTPATIENT_CLINIC_OR_DEPARTMENT_OTHER): Payer: Self-pay

## 2024-03-24 ENCOUNTER — Other Ambulatory Visit: Payer: Self-pay | Admitting: Family Medicine

## 2024-03-24 DIAGNOSIS — I10 Essential (primary) hypertension: Secondary | ICD-10-CM

## 2024-03-25 ENCOUNTER — Other Ambulatory Visit (HOSPITAL_BASED_OUTPATIENT_CLINIC_OR_DEPARTMENT_OTHER): Payer: Self-pay

## 2024-03-25 MED ORDER — AMLODIPINE BESYLATE 10 MG PO TABS
10.0000 mg | ORAL_TABLET | Freq: Every day | ORAL | 0 refills | Status: DC
  Filled 2024-03-25: qty 90, 90d supply, fill #0

## 2024-05-20 ENCOUNTER — Other Ambulatory Visit (HOSPITAL_BASED_OUTPATIENT_CLINIC_OR_DEPARTMENT_OTHER): Payer: Self-pay

## 2024-05-20 MED ORDER — FLUZONE HIGH-DOSE 0.5 ML IM SUSY
0.5000 mL | PREFILLED_SYRINGE | Freq: Once | INTRAMUSCULAR | 0 refills | Status: AC
  Filled 2024-05-20: qty 0.5, 1d supply, fill #0

## 2024-06-17 ENCOUNTER — Other Ambulatory Visit: Payer: Self-pay | Admitting: Family Medicine

## 2024-06-17 ENCOUNTER — Other Ambulatory Visit: Payer: Self-pay

## 2024-06-17 ENCOUNTER — Other Ambulatory Visit (HOSPITAL_BASED_OUTPATIENT_CLINIC_OR_DEPARTMENT_OTHER): Payer: Self-pay

## 2024-06-17 DIAGNOSIS — I10 Essential (primary) hypertension: Secondary | ICD-10-CM

## 2024-06-17 MED ORDER — AMLODIPINE BESYLATE 10 MG PO TABS
10.0000 mg | ORAL_TABLET | Freq: Every day | ORAL | 0 refills | Status: DC
  Filled 2024-06-17 – 2024-07-04 (×3): qty 7, 7d supply, fill #0

## 2024-06-17 NOTE — Telephone Encounter (Signed)
 7 tablets sent to get Pt through weekend.

## 2024-06-17 NOTE — Telephone Encounter (Signed)
 Called and LM asking she call back to make an appointment for follow up last OV in January and was asked to follow up in 6 Months

## 2024-06-21 ENCOUNTER — Other Ambulatory Visit (HOSPITAL_BASED_OUTPATIENT_CLINIC_OR_DEPARTMENT_OTHER): Payer: Self-pay

## 2024-06-22 ENCOUNTER — Other Ambulatory Visit (HOSPITAL_COMMUNITY): Payer: Self-pay

## 2024-06-22 DIAGNOSIS — M65342 Trigger finger, left ring finger: Secondary | ICD-10-CM | POA: Diagnosis not present

## 2024-06-22 DIAGNOSIS — M65332 Trigger finger, left middle finger: Secondary | ICD-10-CM | POA: Diagnosis not present

## 2024-06-22 DIAGNOSIS — G5602 Carpal tunnel syndrome, left upper limb: Secondary | ICD-10-CM | POA: Diagnosis not present

## 2024-06-22 DIAGNOSIS — M65312 Trigger thumb, left thumb: Secondary | ICD-10-CM | POA: Diagnosis not present

## 2024-06-22 MED ORDER — DOXYCYCLINE HYCLATE 100 MG PO CAPS
100.0000 mg | ORAL_CAPSULE | Freq: Two times a day (BID) | ORAL | 0 refills | Status: AC
  Filled 2024-06-22: qty 20, 10d supply, fill #0

## 2024-06-22 MED ORDER — OXYCODONE HCL 5 MG PO TABS
5.0000 mg | ORAL_TABLET | ORAL | 0 refills | Status: AC | PRN
  Filled 2024-06-22: qty 42, 7d supply, fill #0

## 2024-07-04 ENCOUNTER — Other Ambulatory Visit (HOSPITAL_BASED_OUTPATIENT_CLINIC_OR_DEPARTMENT_OTHER): Payer: Self-pay

## 2024-07-04 ENCOUNTER — Other Ambulatory Visit (HOSPITAL_COMMUNITY): Payer: Self-pay

## 2024-07-04 ENCOUNTER — Inpatient Hospital Stay: Attending: Hematology & Oncology

## 2024-07-04 ENCOUNTER — Inpatient Hospital Stay: Admitting: Family

## 2024-07-04 VITALS — BP 160/94 | HR 45 | Temp 98.6°F | Resp 17 | Wt 191.8 lb

## 2024-07-04 DIAGNOSIS — Z880 Allergy status to penicillin: Secondary | ICD-10-CM | POA: Insufficient documentation

## 2024-07-04 DIAGNOSIS — Z170 Estrogen receptor positive status [ER+]: Secondary | ICD-10-CM | POA: Insufficient documentation

## 2024-07-04 DIAGNOSIS — Z91030 Bee allergy status: Secondary | ICD-10-CM | POA: Insufficient documentation

## 2024-07-04 DIAGNOSIS — I10 Essential (primary) hypertension: Secondary | ICD-10-CM | POA: Diagnosis not present

## 2024-07-04 DIAGNOSIS — Z17411 Hormone receptor positive with human epidermal growth factor receptor 2 negative status: Secondary | ICD-10-CM | POA: Diagnosis not present

## 2024-07-04 DIAGNOSIS — C50919 Malignant neoplasm of unspecified site of unspecified female breast: Secondary | ICD-10-CM

## 2024-07-04 DIAGNOSIS — Z780 Asymptomatic menopausal state: Secondary | ICD-10-CM | POA: Insufficient documentation

## 2024-07-04 DIAGNOSIS — Z79811 Long term (current) use of aromatase inhibitors: Secondary | ICD-10-CM

## 2024-07-04 DIAGNOSIS — Z8639 Personal history of other endocrine, nutritional and metabolic disease: Secondary | ICD-10-CM

## 2024-07-04 DIAGNOSIS — C50912 Malignant neoplasm of unspecified site of left female breast: Secondary | ICD-10-CM | POA: Diagnosis present

## 2024-07-04 DIAGNOSIS — Z79899 Other long term (current) drug therapy: Secondary | ICD-10-CM | POA: Diagnosis not present

## 2024-07-04 LAB — CMP (CANCER CENTER ONLY)
ALT: 14 U/L (ref 0–44)
AST: 20 U/L (ref 15–41)
Albumin: 4.6 g/dL (ref 3.5–5.0)
Alkaline Phosphatase: 127 U/L — ABNORMAL HIGH (ref 38–126)
Anion gap: 11 (ref 5–15)
BUN: 8 mg/dL (ref 8–23)
CO2: 27 mmol/L (ref 22–32)
Calcium: 10 mg/dL (ref 8.9–10.3)
Chloride: 105 mmol/L (ref 98–111)
Creatinine: 0.78 mg/dL (ref 0.44–1.00)
GFR, Estimated: >60 mL/min (ref >60)
Glucose, Bld: 132 mg/dL — ABNORMAL HIGH (ref 70–99)
Potassium: 4.2 mmol/L (ref 3.5–5.1)
Sodium: 143 mmol/L (ref 135–145)
Total Bilirubin: 0.5 mg/dL (ref 0.0–1.2)
Total Protein: 8.3 g/dL — ABNORMAL HIGH (ref 6.5–8.1)

## 2024-07-04 LAB — CBC WITH DIFFERENTIAL (CANCER CENTER ONLY)
Abs Immature Granulocytes: 0.02 K/uL (ref 0.00–0.07)
Basophils Absolute: 0 K/uL (ref 0.0–0.1)
Basophils Relative: 1 %
Eosinophils Absolute: 0.1 K/uL (ref 0.0–0.5)
Eosinophils Relative: 2 %
HCT: 42.1 % (ref 36.0–46.0)
Hemoglobin: 13.8 g/dL (ref 12.0–15.0)
Immature Granulocytes: 1 %
Lymphocytes Relative: 40 %
Lymphs Abs: 1.8 K/uL (ref 0.7–4.0)
MCH: 28.1 pg (ref 26.0–34.0)
MCHC: 32.8 g/dL (ref 30.0–36.0)
MCV: 85.7 fL (ref 80.0–100.0)
Monocytes Absolute: 0.4 K/uL (ref 0.1–1.0)
Monocytes Relative: 8 %
Neutro Abs: 2.2 K/uL (ref 1.7–7.7)
Neutrophils Relative %: 48 %
Platelet Count: 317 K/uL (ref 150–400)
RBC: 4.91 MIL/uL (ref 3.87–5.11)
RDW: 14.4 % (ref 11.5–15.5)
WBC Count: 4.4 K/uL (ref 4.0–10.5)
nRBC: 0 % (ref 0.0–0.2)

## 2024-07-04 NOTE — Progress Notes (Signed)
 Hematology and Oncology Follow Up Visit  Laura Patrick [id number] 1956-04-07 68 y.o. 07/04/2024   Principle Diagnosis:  Stage IA (T1aN0M0) infiltrating ductal carcinoma of the LEFT breast   --ER+/PR+/HER2- -- Oncotype =15   Past Therapy: Status post left lumpectomy on 11/27/2021 Radiation therapy --start in 01/21/2022 -- completed on 02/26/2022   Current Therapy:        Femara  2.5 mg PO daily -start after XRT -- started 03/10/2022   Interim History:  Laura Patrick is here today for follow-up. She is doingf airly well but has noted some HTN with her recent dietary intake. She has been eating salty foods and drinking more pop with the holidays. She plans to stop her intake of both these and start checking her BP daily to monitor. She also states that she forgot to take her antihypertensive meds this morning.  No headaches or changes in vision.  No fever, chills, n/v, cough, rash, dizziness, SOB, chest pain, palpitations, abdominal pain or changes in bowel or bladder habits.  Mild puffiness in the ankles. No tenderness, numbness or tingling in her extremities.  No falls or syncope.  Appetite and hydration are good. Weight is stable at 191 lbs.   ECOG Performance Status: 1 - Symptomatic but completely ambulatory  Medications:  Allergies as of 07/04/2024       Reactions   Bee Venom Other (See Comments), Swelling   Both elbows to the hands, swells, and red water like blisters appear. Reaction: Sweating (intolerance)   Amoxicillin Rash        Medication List        Accurate as of July 04, 2024  2:09 PM. If you have any questions, ask your nurse or doctor.          amLODipine  10 MG tablet Commonly known as: NORVASC  Take 1 tablet (10 mg total) by mouth daily. LAST REFILL WITHOUT APPT.   doxycycline  100 MG capsule Commonly known as: VIBRAMYCIN  Take 1 capsule (100 mg total) by mouth 2 (two) times daily for 10 days.   doxycycline  100 MG capsule Commonly known as:  VIBRAMYCIN  Take 1 capsule (100 mg total) by mouth 2 (two) times daily.   letrozole  2.5 MG tablet Commonly known as: FEMARA  Take 1 tablet (2.5 mg total) by mouth daily.   oxyCODONE  5 MG immediate release tablet Commonly known as: Oxy IR/ROXICODONE  Take 1 tablet (5 mg total) by mouth every 4 - 6 (four-six) hours as needed for pain for 7 days.   oxyCODONE  5 MG immediate release tablet Commonly known as: Oxy IR/ROXICODONE  Take 1 tablet by mouth every 4 - 6 hours as needed for pain for 7 days.   potassium chloride  SA 20 MEQ tablet Commonly known as: KLOR-CON  M Take 1 tablet (20 mEq total) by mouth daily. LAST REFILL WITHOUT APPT.   valsartan -hydrochlorothiazide  320-12.5 MG tablet Commonly known as: DIOVAN -HCT Take 1 tablet by mouth daily. LAST REFILL WITHOUT APPT.   Vitamin D  (Ergocalciferol ) 1.25 MG (50000 UNIT) Caps capsule Commonly known as: DRISDOL  Take 1 capsule (50,000 Units total) by mouth every 7 (seven) days.        Allergies:  Allergies  Allergen Reactions   Bee Venom Other (See Comments) and Swelling    Both elbows to the hands, swells, and red water like blisters appear.  Reaction: Sweating (intolerance)   Amoxicillin Rash    Past Medical History, Surgical history, Social history, and Family History were reviewed and updated.  Review of Systems: All other 10 point review  of systems is negative.   Physical Exam:  weight is 191 lb 12.8 oz (87 kg). Her oral temperature is 98.6 F (37 C). Her blood pressure is 160/94 (abnormal) and her pulse is 45 (abnormal). Her respiration is 17 and oxygen saturation is 100%.   Wt Readings from Last 3 Encounters:  07/04/24 191 lb 12.8 oz (87 kg)  03/09/24 190 lb (86.2 kg)  09/14/23 197 lb 2 oz (89.4 kg)    Ocular: Sclerae unicteric, pupils equal, round and reactive to light Ear-nose-throat: Oropharynx clear, dentition fair Lymphatic: No cervical or supraclavicular adenopathy Lungs no rales or rhonchi, good excursion  bilaterally Heart regular rate and rhythm, no murmur appreciated Abd soft, nontender, positive bowel sounds MSK no focal spinal tenderness, no joint edema Neuro: non-focal, well-oriented, appropriate affect Breasts: Deferred   Lab Results  Component Value Date   WBC 4.4 07/04/2024   HGB 13.8 07/04/2024   HCT 42.1 07/04/2024   MCV 85.7 07/04/2024   PLT 317 07/04/2024   No results found for: FERRITIN, IRON, TIBC, UIBC, IRONPCTSAT Lab Results  Component Value Date   RBC 4.91 07/04/2024   No results found for: KPAFRELGTCHN, LAMBDASER, KAPLAMBRATIO No results found for: IGGSERUM, IGA, IGMSERUM No results found for: STEPHANY CARLOTA BENSON MARKEL EARLA JOANNIE DOC VICK, SPEI   Chemistry      Component Value Date/Time   NA 143 07/04/2024 1314   NA 141 01/03/2019 0836   K 4.2 07/04/2024 1314   CL 105 07/04/2024 1314   CO2 27 07/04/2024 1314   BUN 8 07/04/2024 1314   BUN 15 01/03/2019 0836   CREATININE 0.78 07/04/2024 1314   CREATININE 0.91 10/06/2016 1529      Component Value Date/Time   CALCIUM 10.0 07/04/2024 1314   ALKPHOS 127 (H) 07/04/2024 1314   AST 20 07/04/2024 1314   ALT 14 07/04/2024 1314   BILITOT 0.5 07/04/2024 1314       Impression and Plan: Laura Patrick is a very pleasant 68 yo postmenopausal African American female with stage 1A infiltrating ductal carcinoma of the left breast with very good prognostic markers, Oncotype score 15.  She underwent a lumpectomy followed by radiotherapy.  She currently is on Femara  and tolerating nicely. No changes indicated at this time.  She is over due for bone density scan so we will get this within the next few weeks.  Mammogram due again in late April 2026. Order is in.  Follow-up again in another 4 months.   Laura Pepper, NP 12/1/20252:09 PM

## 2024-07-08 ENCOUNTER — Ambulatory Visit: Admitting: Family

## 2024-07-08 ENCOUNTER — Inpatient Hospital Stay

## 2024-07-19 ENCOUNTER — Other Ambulatory Visit: Payer: Self-pay | Admitting: Family Medicine

## 2024-07-19 ENCOUNTER — Other Ambulatory Visit (HOSPITAL_COMMUNITY): Payer: Self-pay

## 2024-07-19 DIAGNOSIS — I10 Essential (primary) hypertension: Secondary | ICD-10-CM

## 2024-07-19 MED ORDER — AMLODIPINE BESYLATE 10 MG PO TABS
10.0000 mg | ORAL_TABLET | Freq: Every day | ORAL | 1 refills | Status: AC
  Filled 2024-07-19: qty 30, 30d supply, fill #0
  Filled 2024-08-23: qty 30, 30d supply, fill #1

## 2024-08-23 ENCOUNTER — Other Ambulatory Visit: Payer: Self-pay

## 2024-08-23 ENCOUNTER — Other Ambulatory Visit: Payer: Self-pay | Admitting: Hematology & Oncology

## 2024-08-23 ENCOUNTER — Encounter (HOSPITAL_COMMUNITY): Payer: Self-pay

## 2024-08-23 ENCOUNTER — Other Ambulatory Visit (HOSPITAL_COMMUNITY): Payer: Self-pay

## 2024-08-23 ENCOUNTER — Other Ambulatory Visit: Payer: Self-pay | Admitting: Family Medicine

## 2024-08-23 DIAGNOSIS — Z8639 Personal history of other endocrine, nutritional and metabolic disease: Secondary | ICD-10-CM

## 2024-08-23 MED ORDER — POTASSIUM CHLORIDE CRYS ER 20 MEQ PO TBCR
20.0000 meq | EXTENDED_RELEASE_TABLET | Freq: Every day | ORAL | 0 refills | Status: AC
  Filled 2024-08-23: qty 30, 30d supply, fill #0

## 2024-08-23 MED ORDER — VITAMIN D (ERGOCALCIFEROL) 1.25 MG (50000 UNIT) PO CAPS
50000.0000 [IU] | ORAL_CAPSULE | ORAL | 1 refills | Status: AC
  Filled 2024-08-23: qty 12, 84d supply, fill #0

## 2024-08-23 MED ORDER — VALSARTAN-HYDROCHLOROTHIAZIDE 320-12.5 MG PO TABS
1.0000 | ORAL_TABLET | Freq: Every day | ORAL | 0 refills | Status: AC
  Filled 2024-08-23: qty 30, 30d supply, fill #0

## 2024-08-23 MED ORDER — LETROZOLE 2.5 MG PO TABS
2.5000 mg | ORAL_TABLET | Freq: Every day | ORAL | 12 refills | Status: AC
  Filled 2024-08-23: qty 30, 30d supply, fill #0

## 2024-09-07 ENCOUNTER — Encounter: Admitting: Family Medicine

## 2024-09-09 ENCOUNTER — Encounter: Admitting: Family Medicine

## 2024-09-14 ENCOUNTER — Encounter: Admitting: Family Medicine

## 2024-10-31 ENCOUNTER — Inpatient Hospital Stay: Admitting: Family

## 2024-10-31 ENCOUNTER — Inpatient Hospital Stay
